# Patient Record
Sex: Male | Born: 1955 | Race: White | Hispanic: No | Smoking: Former smoker
Health system: Southern US, Community
[De-identification: ages and names within clinical notes are randomized; demographics above are authoritative.]

## PROBLEM LIST (undated history)

## (undated) DIAGNOSIS — E7800 Pure hypercholesterolemia, unspecified: Secondary | ICD-10-CM

## (undated) DIAGNOSIS — I509 Heart failure, unspecified: Secondary | ICD-10-CM

## (undated) DIAGNOSIS — S838X9A Sprain of other specified parts of unspecified knee, initial encounter: Secondary | ICD-10-CM

## (undated) DIAGNOSIS — C4359 Malignant melanoma of other part of trunk: Secondary | ICD-10-CM

## (undated) DIAGNOSIS — J449 Chronic obstructive pulmonary disease, unspecified: Secondary | ICD-10-CM

## (undated) DIAGNOSIS — Z95810 Presence of automatic (implantable) cardiac defibrillator: Secondary | ICD-10-CM

## (undated) DIAGNOSIS — M1990 Unspecified osteoarthritis, unspecified site: Secondary | ICD-10-CM

## (undated) DIAGNOSIS — Z9989 Dependence on other enabling machines and devices: Secondary | ICD-10-CM

## (undated) DIAGNOSIS — I10 Essential (primary) hypertension: Secondary | ICD-10-CM

## (undated) DIAGNOSIS — S83206A Unspecified tear of unspecified meniscus, current injury, right knee, initial encounter: Secondary | ICD-10-CM

## (undated) DIAGNOSIS — G4733 Obstructive sleep apnea (adult) (pediatric): Secondary | ICD-10-CM

## (undated) DIAGNOSIS — N400 Enlarged prostate without lower urinary tract symptoms: Secondary | ICD-10-CM

## (undated) DIAGNOSIS — I2510 Atherosclerotic heart disease of native coronary artery without angina pectoris: Secondary | ICD-10-CM

## (undated) DIAGNOSIS — I219 Acute myocardial infarction, unspecified: Secondary | ICD-10-CM

## (undated) HISTORY — DX: Essential (primary) hypertension: I10

## (undated) HISTORY — DX: Benign prostatic hyperplasia without lower urinary tract symptoms: N40.0

## (undated) HISTORY — DX: Chronic obstructive pulmonary disease, unspecified: J44.9

## (undated) HISTORY — DX: Unspecified osteoarthritis, unspecified site: M19.90

## (undated) HISTORY — PX: HERNIA REPAIR: SHX51

## (undated) HISTORY — DX: Acute myocardial infarction, unspecified: I21.9

## (undated) HISTORY — DX: Sprain of other specified parts of unspecified knee, initial encounter: S83.8X9A

## (undated) HISTORY — PX: CARDIAC CATHETERIZATION: SHX172

---
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
## 1996-05-02 DIAGNOSIS — I219 Acute myocardial infarction, unspecified: Secondary | ICD-10-CM

## 1996-05-02 HISTORY — DX: Acute myocardial infarction, unspecified: I21.9

## 2011-05-03 DIAGNOSIS — C4359 Malignant melanoma of other part of trunk: Secondary | ICD-10-CM

## 2011-05-03 HISTORY — PX: MELANOMA EXCISION: SHX5266

## 2011-05-03 HISTORY — DX: Malignant melanoma of other part of trunk: C43.59

## 2011-05-03 HISTORY — PX: CYST REMOVAL LEG: SHX6280

## 2012-05-02 HISTORY — PX: UMBILICAL HERNIA REPAIR: SHX196

## 2012-08-14 DIAGNOSIS — K810 Acute cholecystitis: Secondary | ICD-10-CM | POA: Insufficient documentation

## 2012-11-22 DIAGNOSIS — L729 Follicular cyst of the skin and subcutaneous tissue, unspecified: Secondary | ICD-10-CM | POA: Insufficient documentation

## 2012-12-26 DIAGNOSIS — L089 Local infection of the skin and subcutaneous tissue, unspecified: Secondary | ICD-10-CM | POA: Insufficient documentation

## 2013-05-02 HISTORY — PX: LAPAROSCOPIC CHOLECYSTECTOMY: SUR755

## 2013-11-22 DIAGNOSIS — J449 Chronic obstructive pulmonary disease, unspecified: Secondary | ICD-10-CM | POA: Insufficient documentation

## 2013-11-22 DIAGNOSIS — N529 Male erectile dysfunction, unspecified: Secondary | ICD-10-CM | POA: Insufficient documentation

## 2013-12-14 DIAGNOSIS — I255 Ischemic cardiomyopathy: Secondary | ICD-10-CM | POA: Insufficient documentation

## 2014-02-14 DIAGNOSIS — E8881 Metabolic syndrome: Secondary | ICD-10-CM | POA: Insufficient documentation

## 2014-04-07 DIAGNOSIS — Z713 Dietary counseling and surveillance: Secondary | ICD-10-CM | POA: Insufficient documentation

## 2014-11-05 HISTORY — PX: LAPAROSCOPIC GASTRIC BAND REMOVAL WITH LAPAROSCOPIC GASTRIC SLEEVE RESECTION: SHX6498

## 2016-10-12 ENCOUNTER — Ambulatory Visit (INDEPENDENT_AMBULATORY_CARE_PROVIDER_SITE_OTHER): Payer: Medicare HMO | Admitting: Family Medicine

## 2016-10-12 ENCOUNTER — Encounter: Payer: Self-pay | Admitting: Family Medicine

## 2016-10-12 DIAGNOSIS — I509 Heart failure, unspecified: Secondary | ICD-10-CM

## 2016-10-12 DIAGNOSIS — Z85820 Personal history of malignant melanoma of skin: Secondary | ICD-10-CM

## 2016-10-12 DIAGNOSIS — I10 Essential (primary) hypertension: Secondary | ICD-10-CM

## 2016-10-12 DIAGNOSIS — G4733 Obstructive sleep apnea (adult) (pediatric): Secondary | ICD-10-CM | POA: Insufficient documentation

## 2016-10-12 DIAGNOSIS — N400 Enlarged prostate without lower urinary tract symptoms: Secondary | ICD-10-CM | POA: Diagnosis not present

## 2016-10-12 MED ORDER — TAMSULOSIN HCL 0.4 MG PO CAPS: 0.4000 mg | ORAL_CAPSULE | Freq: Every day | ORAL | 3 refills | Status: DC

## 2016-10-12 NOTE — Progress Notes (Signed)
BP 115/64   Pulse 63   Temp 97.4 F (36.3 C) (Oral)   Ht 5' 10" (1.778 m)   Wt 270 lb (122.5 kg)   BMI 38.74 kg/m    Subjective:    Patient ID: Russell Werner, male    DOB: 12-Dec-1955, 61 y.o.   MRN: [id number]  HPI: Russell Werner is a 61 y.o. male presenting on 10/12/2016 for Establish Care (recently moved here from [place])   HPI Hypertension And congestive heart failure Patient comes to establish care. Patient is currently on lisinopril and bisoprolol, and her blood pressure today is 115/64. Patient denies any lightheadedness or dizziness. Patient denies headaches, blurred vision, chest pains, shortness of breath, or weakness. Denies any side effects from medication and is content with current medication. He denies any swelling  Sleep apnea symptoms Patient has been having snoring and his wife tells him that he stops breathing while he is sleeping and needs a referral for sleep study.  BPH checkup Patient is on Flomax currently for BPH and says it is working well for him. He just needs to continue that medication. He urinates about 1-2 times a night and denies any major stream issues currently.  History of melanoma Patient has a history of melanoma and just needs a new dermatologist to get regular checkups here now that he lives in this area. He has not noticed any skin lesions that have arisen and his lesion that he had previously was on his back.  Relevant past medical, surgical, family and social history reviewed and updated as indicated. Interim medical history since our last visit reviewed. Allergies and medications reviewed and updated.  Review of Systems  Constitutional: Negative for chills and fever.  Eyes: Negative for discharge.  Respiratory: Negative for shortness of breath and wheezing.   Cardiovascular: Negative for chest pain, palpitations and leg swelling.  Musculoskeletal: Negative for back pain and gait problem.  Skin: Negative for rash.  Neurological:  Negative for dizziness, weakness, light-headedness and numbness.  Psychiatric/Behavioral: Positive for sleep disturbance. Negative for self-injury and suicidal ideas. The patient is not nervous/anxious.   All other systems reviewed and are negative.   Per HPI unless specifically indicated above  Social History   Social History  . Marital status: Single    Spouse name: N/A  . Number of children: N/A  . Years of education: N/A   Occupational History  . Not on file.   Social History Main Topics  . Smoking status: Former Smoker    Packs/day: 1.00    Types: Cigarettes    Quit date: 05/02/2014  . Smokeless tobacco: Never Used  . Alcohol use Yes     Comment: occasional  . Drug use: No  . Sexual activity: Not on file   Other Topics Concern  . Not on file   Social History Narrative  . No narrative on file    Past Surgical History:  Procedure Laterality Date  . CARDIAC CATHETERIZATION    . CHOLECYSTECTOMY  2015  . CYST REMOVAL LEG  2013  . HERNIA REPAIR  2014  . KNEE ARTHROSCOPY Left 1972  . LAPAROSCOPIC GASTRIC BAND REMOVAL WITH LAPAROSCOPIC GASTRIC SLEEVE RESECTION  11/05/2014  . MELANOMA EXCISION  2013    Family History  Problem Relation Age of Onset  . Early death Mother   . Cancer Sister        breast  . HIV Brother   . Cancer Maternal Grandmother        breast  .  Stroke Maternal Grandfather   . Cancer Sister        melanoma    Allergies as of 10/12/2016   No Known Allergies     Medication List       Accurate as of 10/12/16  9:37 AM. Always use your most recent med list.          bisoprolol 5 MG tablet Commonly known as:  ZEBETA Take 2.5 mg by mouth daily. Take 1/2 of 5 mg tablet daily   clindamycin 1 % gel Commonly known as:  CLINDAGEL Apply 1 application topically daily.   ketoconazole 2 % cream Commonly known as:  NIZORAL Apply 1 application topically 2 (two) times daily.   lisinopril 2.5 MG tablet Commonly known as:   PRINIVIL,ZESTRIL Take 2.5 mg by mouth daily.   tamsulosin 0.4 MG Caps capsule Commonly known as:  FLOMAX Take 1 capsule (0.4 mg total) by mouth daily after supper. Take 1 capsule daily 1/2 hour after the same meal daily          Objective:    BP 115/64   Pulse 63   Temp 97.4 F (36.3 C) (Oral)   Ht 5' 10" (1.778 m)   Wt 270 lb (122.5 kg)   BMI 38.74 kg/m   Wt Readings from Last 3 Encounters:  10/12/16 270 lb (122.5 kg)    Physical Exam  Constitutional: He is oriented to person, place, and time. He appears well-developed and well-nourished. No distress.  Eyes: Conjunctivae are normal. No scleral icterus.  Neck: Neck supple. No thyromegaly present.  Cardiovascular: Normal rate, regular rhythm, normal heart sounds and intact distal pulses.   No murmur heard. Pulmonary/Chest: Effort normal and breath sounds normal. No respiratory distress. He has no wheezes. He has no rales.  Musculoskeletal: Normal range of motion. He exhibits no edema.  Lymphadenopathy:    He has no cervical adenopathy.  Neurological: He is alert and oriented to person, place, and time. Coordination normal.  Skin: Skin is warm and dry. No rash noted. He is not diaphoretic.  Psychiatric: He has a normal mood and affect. His behavior is normal.  Nursing note and vitals reviewed.   No results found for this or any previous visit.    Assessment & Plan:   Problem List Items Addressed This Visit      Cardiovascular and Mediastinum   CHF (congestive heart failure) (HCC)   Relevant Medications   lisinopril (PRINIVIL,ZESTRIL) 2.5 MG tablet   bisoprolol (ZEBETA) 5 MG tablet   Other Relevant Orders   ECHOCARDIOGRAM COMPLETE   CBC with Differential/Platelet   Lipid panel   Hypertension   Relevant Medications   lisinopril (PRINIVIL,ZESTRIL) 2.5 MG tablet   bisoprolol (ZEBETA) 5 MG tablet   Other Relevant Orders   CMP14+EGFR   Lipid panel     Respiratory   Sleep apnea, obstructive   Relevant Orders    Ambulatory referral to Sleep Studies     Genitourinary   BPH (benign prostatic hyperplasia)   Relevant Medications   tamsulosin (FLOMAX) 0.4 MG CAPS capsule     Other   History of melanoma   Relevant Orders   Ambulatory referral to Dermatology       Follow up plan: Return in about 6 months (around 04/13/2017), or if symptoms worsen or fail to improve, for htn and bph follow up.  Caryl Pina, MD [facility] 10/12/2016, 9:37 AM

## 2016-10-14 ENCOUNTER — Other Ambulatory Visit: Payer: Self-pay

## 2016-10-14 ENCOUNTER — Other Ambulatory Visit (HOSPITAL_COMMUNITY): Payer: Self-pay

## 2016-10-14 ENCOUNTER — Ambulatory Visit (HOSPITAL_COMMUNITY)
Admission: RE | Admit: 2016-10-14 | Discharge: 2016-10-14 | Disposition: A | Discharge status: Home / Self Care | Payer: Medicare HMO | Source: Ambulatory Visit | Attending: Family Medicine | Admitting: Family Medicine

## 2016-10-14 DIAGNOSIS — G4733 Obstructive sleep apnea (adult) (pediatric): Secondary | ICD-10-CM | POA: Diagnosis not present

## 2016-10-14 DIAGNOSIS — I509 Heart failure, unspecified: Secondary | ICD-10-CM | POA: Diagnosis not present

## 2016-10-14 DIAGNOSIS — I110 Hypertensive heart disease with heart failure: Secondary | ICD-10-CM | POA: Diagnosis not present

## 2016-10-14 LAB — ECHOCARDIOGRAM COMPLETE
AVLVOTPG: 2 mmHg
CHL CUP MV DEC (S): 232
E decel time: 232 msec
E/e' ratio: 17.72
FS: 15 % — AB (ref 28–44)
IVS/LV PW RATIO, ED: 0.98
LA diam end sys: 55 mm
LA diam index: 2.19 cm/m2
LA vol: 107 mL
LASIZE: 55 mm
LAVOLA4C: 118 mL
LAVOLIN: 42.6 mL/m2
LV E/e' medial: 17.72
LV TDI E'MEDIAL: 6.09
LV dias vol: 210 mL — AB (ref 62–150)
LV e' LATERAL: 5.22 cm/s
LVDIAVOLIN: 84 mL/m2
LVEEAVG: 17.72
LVOT SV: 64 mL
LVOT VTI: 16.9 cm
LVOT area: 3.8 cm2
LVOT peak vel: 73.1 cm/s
LVOTD: 22 mm
LVSYSVOL: 138 mL — AB
LVSYSVOLIN: 55 mL/m2
Lateral S' vel: 12.1 cm/s
MV Peak grad: 3 mmHg
MVPKAVEL: 96.3 m/s
MVPKEVEL: 92.5 m/s
PW: 12.7 mm — AB (ref 0.6–1.1)
Simpson's disk: 34
Stroke v: 72 ml
TAPSE: 19.1 mm
TDI e' lateral: 5.22

## 2016-10-14 NOTE — Progress Notes (Signed)
*  PRELIMINARY RESULTS* Echocardiogram 2D Echocardiogram has been performed.  Russell Werner 10/14/2016, 12:10 PM

## 2016-11-01 DIAGNOSIS — D485 Neoplasm of uncertain behavior of skin: Secondary | ICD-10-CM | POA: Diagnosis not present

## 2016-11-01 DIAGNOSIS — D2239 Melanocytic nevi of other parts of face: Secondary | ICD-10-CM | POA: Diagnosis not present

## 2016-11-01 DIAGNOSIS — C44319 Basal cell carcinoma of skin of other parts of face: Secondary | ICD-10-CM | POA: Diagnosis not present

## 2016-11-01 DIAGNOSIS — D225 Melanocytic nevi of trunk: Secondary | ICD-10-CM | POA: Diagnosis not present

## 2016-11-01 DIAGNOSIS — Z85820 Personal history of malignant melanoma of skin: Secondary | ICD-10-CM | POA: Diagnosis not present

## 2016-11-10 ENCOUNTER — Ambulatory Visit (INDEPENDENT_AMBULATORY_CARE_PROVIDER_SITE_OTHER): Payer: Medicare HMO | Admitting: Cardiovascular Disease

## 2016-11-10 ENCOUNTER — Encounter: Payer: Self-pay | Admitting: Cardiovascular Disease

## 2016-11-10 VITALS — BP 131/86 | HR 64 | Ht 70.0 in | Wt 278.2 lb

## 2016-11-10 DIAGNOSIS — I10 Essential (primary) hypertension: Secondary | ICD-10-CM | POA: Diagnosis not present

## 2016-11-10 DIAGNOSIS — I5022 Chronic systolic (congestive) heart failure: Secondary | ICD-10-CM | POA: Diagnosis not present

## 2016-11-10 DIAGNOSIS — I429 Cardiomyopathy, unspecified: Secondary | ICD-10-CM | POA: Diagnosis not present

## 2016-11-10 DIAGNOSIS — G4733 Obstructive sleep apnea (adult) (pediatric): Secondary | ICD-10-CM

## 2016-11-10 MED ORDER — SACUBITRIL-VALSARTAN 24-26 MG PO TABS: 1.0000 | ORAL_TABLET | Freq: Two times a day (BID) | ORAL | 6 refills | Status: DC

## 2016-11-10 MED ORDER — SACUBITRIL-VALSARTAN 24-26 MG PO TABS: 1.0000 | ORAL_TABLET | Freq: Two times a day (BID) | ORAL | 0 refills | Status: DC

## 2016-11-10 NOTE — Addendum Note (Signed)
Addended by: Laurine Blazer on: 11/10/2016 09:46 AM   Modules accepted: Orders

## 2016-11-10 NOTE — Patient Instructions (Signed)
Medication Instructions:   Stop Lisinopril.  Begin Entresto 24/26mg  twice a day - may begin tomorrow evening for first dose.  Continue all other medications.    Labwork:  TSH, CMET, CBC - orders given today.  Office will contact with results via phone or letter.    Testing/Procedures: none  Follow-Up: 3 months   Any Other Special Instructions Will Be Listed Below (If Applicable).  If you need a refill on your cardiac medications before your next appointment, please call your pharmacy.

## 2016-11-10 NOTE — Progress Notes (Signed)
CARDIOLOGY CONSULT NOTE  Patient ID: Russell Werner MRN: [id number] DOB/AGE: [birth year] 61 y.o.  Admit date: (Not on file) Primary Physician: Dettinger, Fransisca Kaufmann, MD Referring Physician: Dettinger  Reason for Consultation: Cardiomyopathy  HPI: Russell Werner is a 61 y.o. male who is being seen today for the evaluation of cardiomyopathy at the request of Dettinger, Fransisca Kaufmann, MD.   Echocardiogram 10/14/16 demonstrated severely reduced left ventricular systolic function with diffuse hypokinesis, LVEF 15-20%, mild LVH, grade 1 diastolic dysfunction, elevated ventricular filling pressures, akinesis of the basal anteroseptal and inferoseptal walls, mild aortic root dilatation, moderate to severe left atrial dilatation.  He has a history of hypertension and severe sleep apnea.  There is a prior history of tobacco abuse.  He said he had a "mild heart attack" about 20 years ago in [place]. He underwent cardiac catheterization at that time with no PCI. He then underwent coronary angiography 7 or 8 years ago at [facility] in [place]. His cardiologist for the past 3 years has been Dr. Mee Hives who works with [facility] in [place], [place].  He has been scheduled for a sleep study on 11/24/2016. He has not used CPAP for the past 5 months.  He used to weigh 440 pounds but underwent bariatric surgery on 11/05/2014 and got down to 220 pounds.  He relocated to [place] from [place] about 5 months ago. He had been caring for his friend's mother who passed away recently. His sleep habits became disrupted and he put on 50 pounds. He had been exercising at the gym 6 days per week before moving here.  He denies chest pain and palpitations. He gets short of breath occasionally when he vapes.  He said an AICD was never discussed with him by his previous cardiologist. He has been told in the past that his cardiomyopathy/congestive heart failure is  due to severe sleep apnea.  He has a history of COPD as well.  ECG performed in the office today which I ordered and personally interpreted demonstrates sinus rhythm with LVH, repolarization abnormalities, and nonspecific intraventricular conduction delay.      No Known Allergies  Current Outpatient Prescriptions  Medication Sig Dispense Refill  . bisoprolol (ZEBETA) 5 MG tablet Take 2.5 mg by mouth daily. Take 1/2 of 5 mg tablet daily    . clindamycin (CLINDAGEL) 1 % gel Apply 1 application topically daily.    Marland Kitchen ketoconazole (NIZORAL) 2 % cream Apply 1 application topically 2 (two) times daily.    Marland Kitchen lisinopril (PRINIVIL,ZESTRIL) 2.5 MG tablet Take 2.5 mg by mouth daily.    . tamsulosin (FLOMAX) 0.4 MG CAPS capsule Take 1 capsule (0.4 mg total) by mouth daily after supper. Take 1 capsule daily 1/2 hour after the same meal daily 30 capsule 3   No current facility-administered medications for this visit.     Past Medical History:  Diagnosis Date  . Arthritis   . Cancer ([place])    skin  . COPD (chronic obstructive pulmonary disease) ([facility])   . Enlarged prostate   . Hypertension   . Myocardial infarction ([place]) 1998   mild  . Sleep apnea     Past Surgical History:  Procedure Laterality Date  . CARDIAC CATHETERIZATION    . CHOLECYSTECTOMY  2015  . CYST REMOVAL LEG  2013  . HERNIA REPAIR  2014  . KNEE ARTHROSCOPY Left 1972  . LAPAROSCOPIC GASTRIC BAND REMOVAL WITH LAPAROSCOPIC GASTRIC SLEEVE RESECTION  11/05/2014  . MELANOMA EXCISION  2013    Social History   Social History  . Marital status: Single    Spouse name: N/A  . Number of children: N/A  . Years of education: N/A   Occupational History  . Not on file.   Social History Main Topics  . Smoking status: Former Smoker    Packs/day: 1.00    Years: 47.00    Types: Cigarettes, E-cigarettes    Start date: 06/24/1967    Quit date: 05/02/2014  . Smokeless tobacco: Never Used  . Alcohol use Yes     Comment:  occasional  . Drug use: No  . Sexual activity: Not on file   Other Topics Concern  . Not on file   Social History Narrative  . No narrative on file     No family history of premature CAD in 1st degree relatives.  No outpatient prescriptions have been marked as taking for the 11/10/16 encounter (Office Visit) with Herminio Commons, MD.      Review of systems complete and found to be negative unless listed above in HPI    Physical exam Blood pressure 131/86, pulse 64, height 5\' 10"  (1.778 m), weight 278 lb 3.2 oz (126.2 kg), SpO2 94 %. General: NAD Neck: No JVD, no thyromegaly or thyroid nodule.  Lungs: Clear to auscultation bilaterally with normal respiratory effort. CV: Nondisplaced PMI. Regular rate and rhythm, normal S1/S2, no S3/S4, no murmur.  No peripheral edema.  No carotid bruit.    Abdomen: Soft, nontender, no distention.  Skin: Intact without lesions or rashes.  Neurologic: Alert and oriented x 3.  Psych: Normal affect. Extremities: No clubbing or cyanosis.  HEENT: Normal.   ECG: Most recent ECG reviewed.   Labs: No results found for: K, BUN, CREATININE, ALT, TSH, HGB   Lipids: No results found for: LDLCALC, LDLDIRECT, CHOL, TRIG, HDL      ASSESSMENT AND PLAN:  1. Cardiomyopathy/chronic systolic heart failure: He is on bisoprolol. I will discontinue lisinopril and start Entresto 24/26 mg bid. I will attempt to obtain cardiac records from his cardiologist in [place] including coronary angiography reports, echocardiogram reports, and most recent office notes for personal review. I informed him of possible side effects of Entresto including lightheadedness and dizziness. I will obtain a TSH, comprehensive metabolic panel, and CBC. After he has been on optimal medical therapy for several months I will repeat an echocardiogram. If left ventricular systolic function remains severely reduced, I will refer to EP for ICD candidacy. QRS duration does not meet  criteria for cardiac resynchronization therapy. He has not needed diuretics since undergoing bariatric surgery.  2. Hypertension: Controlled. Monitor given institution of Entresto.  3. Severe sleep apnea: Await sleep study results.  Disposition: Follow up in 3 months  Signed: Kate Sable, M.D., F.A.C.C.  11/10/2016, 8:35 AM

## 2016-11-22 ENCOUNTER — Other Ambulatory Visit: Payer: Medicare HMO

## 2016-11-22 ENCOUNTER — Other Ambulatory Visit: Payer: Self-pay | Admitting: Cardiovascular Disease

## 2016-11-22 DIAGNOSIS — I10 Essential (primary) hypertension: Secondary | ICD-10-CM | POA: Diagnosis not present

## 2016-11-22 DIAGNOSIS — I429 Cardiomyopathy, unspecified: Secondary | ICD-10-CM | POA: Diagnosis not present

## 2016-11-22 MED ORDER — SACUBITRIL-VALSARTAN 24-26 MG PO TABS: 1.0000 | ORAL_TABLET | Freq: Two times a day (BID) | ORAL | 1 refills | Status: DC

## 2016-11-23 ENCOUNTER — Other Ambulatory Visit: Payer: Self-pay

## 2016-11-23 LAB — COMPREHENSIVE METABOLIC PANEL
ALBUMIN: 3.8 g/dL (ref 3.6–4.8)
ALK PHOS: 55 IU/L (ref 39–117)
ALT: 11 IU/L (ref 0–44)
AST: 11 IU/L (ref 0–40)
Albumin/Globulin Ratio: 1.7 (ref 1.2–2.2)
BUN/Creatinine Ratio: 23 (ref 10–24)
BUN: 18 mg/dL (ref 8–27)
Bilirubin Total: 0.6 mg/dL (ref 0.0–1.2)
CALCIUM: 8.9 mg/dL (ref 8.6–10.2)
CO2: 25 mmol/L (ref 20–29)
Chloride: 103 mmol/L (ref 96–106)
Creatinine, Ser: 0.77 mg/dL (ref 0.76–1.27)
GFR calc Af Amer: 112 mL/min/{1.73_m2} (ref >59)
GFR calc non Af Amer: 97 mL/min/{1.73_m2} (ref >59)
GLUCOSE: 103 mg/dL — AB (ref 65–99)
Globulin, Total: 2.3 g/dL (ref 1.5–4.5)
POTASSIUM: 4.4 mmol/L (ref 3.5–5.2)
SODIUM: 144 mmol/L (ref 134–144)
TOTAL PROTEIN: 6.1 g/dL (ref 6.0–8.5)

## 2016-11-23 LAB — TSH: TSH: 2.33 u[IU]/mL (ref 0.450–4.500)

## 2016-11-23 LAB — CBC
HEMOGLOBIN: 13.9 g/dL (ref 13.0–17.7)
Hematocrit: 41.2 % (ref 37.5–51.0)
MCH: 30.1 pg (ref 26.6–33.0)
MCHC: 33.7 g/dL (ref 31.5–35.7)
MCV: 89 fL (ref 79–97)
Platelets: 176 10*3/uL (ref 150–379)
RBC: 4.62 x10E6/uL (ref 4.14–5.80)
RDW: 13.4 % (ref 12.3–15.4)
WBC: 5.9 10*3/uL (ref 3.4–10.8)

## 2016-11-23 LAB — PLEASE NOTE

## 2016-11-23 MED ORDER — SACUBITRIL-VALSARTAN 24-26 MG PO TABS: 1.0000 | ORAL_TABLET | Freq: Two times a day (BID) | ORAL | 1 refills | Status: DC

## 2016-11-24 ENCOUNTER — Ambulatory Visit (INDEPENDENT_AMBULATORY_CARE_PROVIDER_SITE_OTHER): Payer: Medicare HMO | Admitting: Neurology

## 2016-11-24 ENCOUNTER — Encounter: Payer: Self-pay | Admitting: Neurology

## 2016-11-24 VITALS — BP 116/81 | HR 67 | Ht 70.0 in | Wt 274.5 lb

## 2016-11-24 DIAGNOSIS — R0989 Other specified symptoms and signs involving the circulatory and respiratory systems: Secondary | ICD-10-CM

## 2016-11-24 DIAGNOSIS — J449 Chronic obstructive pulmonary disease, unspecified: Secondary | ICD-10-CM

## 2016-11-24 DIAGNOSIS — I43 Cardiomyopathy in diseases classified elsewhere: Secondary | ICD-10-CM | POA: Diagnosis not present

## 2016-11-24 DIAGNOSIS — G4733 Obstructive sleep apnea (adult) (pediatric): Secondary | ICD-10-CM | POA: Diagnosis not present

## 2016-11-24 DIAGNOSIS — I110 Hypertensive heart disease with heart failure: Secondary | ICD-10-CM

## 2016-11-24 NOTE — Patient Instructions (Signed)

## 2016-11-24 NOTE — Progress Notes (Signed)
SLEEP MEDICINE CLINIC   Provider:  Larey Seat, M D  Primary Care Physician:  Dettinger, Fransisca Kaufmann, MD   Referring Provider: Dettinger, Fransisca Kaufmann, MD    Chief Complaint  Patient presents with  . New Patient (Initial Visit)    HPI:  Russell Werner is a 61 y.o. male , seen here as in a referral/ revisit  from Dr. Warrick Parisian for a sleep coinsultation. [name] is a 61 year old Caucasian gentleman who presents with a copy of his previous sleep studies. The patient used to be super obese, and his peak his weight was 440 pounds. He was diagnosed with obesity hypoventilation, congestive heart failure, cardiomyopathy, hypertension and COPD. His last polysomnography was performed as a split night in [place], [place]. His total AHI was 79.8, the oxygen nadir was 69% during REM and 71% in non-REM sleep, and he spent 40.5 minutes in desaturation. His RDI was 101.7 he qualified for split-night protocol and was titrated to BiPAP as he had previously failed CPAP. Beginning at 12 cm water over 8 he was titrated to a final pressure of 18/14. Achieved an AHI of 0.0, but without any sleep at that setting! The interpreting physician stated that also there was severe obstructive sleep apnea and hypoxemia present no optimal pressure was reached. Also that there was a high air leak through a face mask attributed to facial hair. He was given BiPAP at 25/21 and he couldn't tolerate it.  He stopped using it, instead pursued weight loss, and quit smoking. He lost about 100 pounds by using vitamin B supplements, followed by a bariatric surgery 2 years ago. He underwent sleeve surgery, and since then has lost another another additional 130 pounds. He regained 60 pounds over the last 5 months and his wife now has noted him again to snore and has some apneic breathing.   Sleep habits are as follows: Because to bed between 10 and 11:30 pm, he is usually asleep rather promptly, he sleeps on his right side. He  has nocturia 1 on average.  Sleeps on one pillow, reports no vivid dreams. Usually he leaves for the bathroom break at around 3 AM but can go back to sleep. He rises at 5 AM. He averages 6 hours of sleep at night, but only if he doesn't sleep in daytime.A daytime nap the last about 45 minutes, usually after lunch.   Sleep medical history and family sleep history: The patient has no known family history of sleep disordered breathing, no history of sleepwalking, but he had sleep apnea since childhood.  Social history:  Lives with girlfriend. Moved to [place] 4 month ago. The patient has not worked since 2010, he was granted disability 3 years ago. He used to be a Medical illustrator. Quit smoking 2 years ago - but uses vapor. 40 pack year history. ETOH seldomly- 3-4 a year, caffeine use - 40 ounces coffee a day .  Review of Systems: Out of a complete 14 system review, the patient complains of only the following symptoms, and all other reviewed systems are negative.  Blurred vision, snoring, tinnitus, the feeling of not getting enough sleep, insomnia and snoring, urinary problems, history of COPD, CHF, hypertension, history of superobesity, cholecystectomy, bariatric surgery , melanoma on his back- surgically removed- no mets. . I reviewed the patient's medications,  Epworth score 7 with daytime naps  , Fatigue severity score 25  , depression score 1/15    Social History   Social History  . Marital  status: Single    Spouse name: N/A  . Number of children: N/A  . Years of education: N/A   Occupational History  . Not on file.   Social History Main Topics  . Smoking status: Former Smoker    Packs/day: 1.00    Years: 47.00    Types: Cigarettes, E-cigarettes    Start date: 06/24/1967    Quit date: 05/02/2014  . Smokeless tobacco: Never Used  . Alcohol use Yes     Comment: occasional  . Drug use: No  . Sexual activity: Not on file   Other Topics Concern  . Not on file   Social History Narrative  .  No narrative on file    Family History  Problem Relation Age of Onset  . Early death Mother   . Cancer Sister        breast  . HIV Brother   . Cancer Maternal Grandmother        breast  . Stroke Maternal Grandfather   . Cancer Sister        melanoma    Past Medical History:  Diagnosis Date  . Arthritis   . Cancer ([place])    skin  . COPD (chronic obstructive pulmonary disease) ([place])   . Enlarged prostate   . Hypertension   . Myocardial infarction ([facility]) 1998   mild  . Sleep apnea     Past Surgical History:  Procedure Laterality Date  . CARDIAC CATHETERIZATION    . CHOLECYSTECTOMY  2015  . CYST REMOVAL LEG  2013  . HERNIA REPAIR  2014  . KNEE ARTHROSCOPY Left 1972  . LAPAROSCOPIC GASTRIC BAND REMOVAL WITH LAPAROSCOPIC GASTRIC SLEEVE RESECTION  11/05/2014  . MELANOMA EXCISION  2013    Current Outpatient Prescriptions  Medication Sig Dispense Refill  . bisoprolol (ZEBETA) 5 MG tablet Take 2.5 mg by mouth daily. Take 1/2 of 5 mg tablet daily    . Calcium Carbonate (CALTRATE 600 PO) Take 1 tablet by mouth 2 (two) times daily.    . clindamycin (CLINDAGEL) 1 % gel Apply 1 application topically daily.    Marland Kitchen ketoconazole (NIZORAL) 2 % cream Apply 1 application topically 2 (two) times daily.    . sacubitril-valsartan (ENTRESTO) 24-26 MG Take 1 tablet by mouth 2 (two) times daily. 60 tablet 1  . tamsulosin (FLOMAX) 0.4 MG CAPS capsule Take 1 capsule (0.4 mg total) by mouth daily after supper. Take 1 capsule daily 1/2 hour after the same meal daily 30 capsule 3   No current facility-administered medications for this visit.     Allergies as of 11/24/2016  . (No Known Allergies)    Vitals: BP 116/81   Pulse 67   Ht 5\' 10"  (1.778 m)   Wt 274 lb 8 oz (124.5 kg)   BMI 39.39 kg/m  Last Weight:  Wt Readings from Last 1 Encounters:  11/24/16 274 lb 8 oz (124.5 kg)   VEL:FYBO mass index is 39.39 kg/m.     Last Height:   Ht Readings from Last 1 Encounters:  11/24/16 5\' 10"   (1.778 m)    Physical exam:  General: The patient is awake, alert and appears not in acute distress. The patient is well groomed. Head: Normocephalic, atraumatic. Neck is supple. Mallampati 3, the tip of the uvula is not visible. Functional microglossia, no retrognathia,  neck circumference:18". Nasal airflow patent,   Cardiovascular:  Regular rate and rhythm, and without distended neck veins. Respiratory: Lung auscultation; rhonchi. Skin:  Without evidence  of edema, or rash Trunk: BMI is  39.4 The patient's posture is poor  Neurologic exam : The patient is awake and alert, oriented to place and time.   Attention span & concentration ability appears normal.  Speech is fluent,  Without dysarthria, dysphonia or aphasia.  Mood and affect are appropriate.  Cranial nerves: Pupils are equal and briskly reactive to light. Funduscopic exam without evidence of pallor or edema.  Extraocular movements  in vertical and horizontal planes intact and without nystagmus. Visual fields by finger perimetry are intact. Hearing to finger rub intact.  Facial sensation intact to fine touch. Facial motor strength is symmetric and tongue and uvula move midline. Shoulder shrug was symmetrical.   Motor exam:  Normal tone, muscle bulk and symmetric strength in all extremities. Sensory:  Fine touch, pinprick and vibration were tested in all extremities. Proprioception tested in the upper extremities was normal. Coordination:  Finger-to-nose maneuver  normal without evidence of ataxia, dysmetria or tremor. Gait and station: Patient walks without assistive device and is able unassisted to climb up to the exam table. Strength within normal limits. Left knee buckles.  Stance is stable and normal.Tandem gait is unfragmented. Turns with 4  Steps. Romberg testing is  negative.  Deep tendon reflexes: in the  upper and lower extremities are symmetric and intact. Babinski maneuver response is downgoing.    Mr. Pinard  presents with a successful weight loss history, but remains morbidly obese. In comparison to where he started at 378 pounds he has certainly won a difficult battle. He is status post gastric sleeve surgery, but he still snores, he may still have apnea, and she craves off a daytime nap. His nocturnal sleep time is also limited usually between 5 and 6 hours only. Based on his last sleep study which was performed before bariatric surgery he had been using some BiPAP for years. His machine is long broken and he does not have axis to Pap therapy at home now. However he continues to have cardiomyopathy and may still have obesity hypoventilation as diagnosed by his pulmonologist. I would like for him to requalify in a split-night polysomnography with capnography. Please note that the BMI at the time of the last sleep study was 47.3 and is now 35.   Assessment:  After physical and neurologic examination, review of laboratory studies,  Personal review of imaging studies, reports of other /same  Imaging studies, results of polysomnography and / or neurophysiology testing and pre-existing records as far as provided in visit., my assessment is   1) History of obesity hypoventilation- followed by Pulmonologist, Cardiomyopathy, COPD and severe OSA. He couod not tolerate CPAP at the time when at his most obese status.   2) Likely OSA is still present, snoring is witnessed. He will need to present for a SPLIT night to requalify for positive airway pressure therapy. I like for him to try CPAP first, if CPAP fails him we will change to BiPAP, BiPAP ST ,or if needed, even ASV.  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 He noted that if he stays up all night he eats more - he burns less. He needs to go back to his normal circadian rhythm, setting a bedtime before midnight, rising after 7 hours of sleep if possible. He also needs to really establish an  exercise regimen which has suffered since he moved to [place].   The patient  was advised of the nature of the diagnosed disorder , the treatment options and the  risks for general health and wellness arising from not treating the condition.   I spent more than 45 minutes of face to face time with the patient.  Greater than 50% of time was spent in counseling and coordination of care. We have discussed the diagnosis and differential and I answered the patient's questions.    Plan:  Treatment plan and additional workup :   SPLIT with Co2- CPAP to try first, follow with BiPAP, ST or whatever modality needed. Avoid FFM with facial hair   RV after study.   Larey Seat, MD 09/16/9840, 1:03 AM  Certified in Neurology by ABPN Certified in [place] by [facility] [address], [place], [place] [address]

## 2016-11-29 ENCOUNTER — Telehealth: Payer: Self-pay | Admitting: *Deleted

## 2016-11-29 NOTE — Telephone Encounter (Signed)
Fax received from [facility] approved 08/26/2016 to 11/23/2017.  This application was submitted by the patient.

## 2016-12-08 DIAGNOSIS — C44319 Basal cell carcinoma of skin of other parts of face: Secondary | ICD-10-CM | POA: Diagnosis not present

## 2017-01-03 ENCOUNTER — Other Ambulatory Visit: Payer: Self-pay | Admitting: *Deleted

## 2017-01-03 DIAGNOSIS — N400 Enlarged prostate without lower urinary tract symptoms: Secondary | ICD-10-CM

## 2017-01-03 MED ORDER — TAMSULOSIN HCL 0.4 MG PO CAPS: 0.4000 mg | ORAL_CAPSULE | Freq: Every day | ORAL | 0 refills | Status: DC

## 2017-01-12 ENCOUNTER — Other Ambulatory Visit: Payer: Self-pay

## 2017-01-12 MED ORDER — SACUBITRIL-VALSARTAN 24-26 MG PO TABS: 1.0000 | ORAL_TABLET | Freq: Two times a day (BID) | ORAL | 3 refills | Status: DC

## 2017-01-17 ENCOUNTER — Other Ambulatory Visit: Payer: Self-pay | Admitting: Cardiovascular Disease

## 2017-01-22 ENCOUNTER — Ambulatory Visit (INDEPENDENT_AMBULATORY_CARE_PROVIDER_SITE_OTHER): Payer: Medicare HMO | Admitting: Neurology

## 2017-01-22 DIAGNOSIS — J449 Chronic obstructive pulmonary disease, unspecified: Secondary | ICD-10-CM

## 2017-01-22 DIAGNOSIS — R0989 Other specified symptoms and signs involving the circulatory and respiratory systems: Secondary | ICD-10-CM

## 2017-01-22 DIAGNOSIS — I110 Hypertensive heart disease with heart failure: Secondary | ICD-10-CM

## 2017-01-22 DIAGNOSIS — I43 Cardiomyopathy in diseases classified elsewhere: Principal | ICD-10-CM

## 2017-01-22 DIAGNOSIS — G4733 Obstructive sleep apnea (adult) (pediatric): Secondary | ICD-10-CM | POA: Diagnosis not present

## 2017-01-24 ENCOUNTER — Telehealth: Payer: Self-pay | Admitting: Neurology

## 2017-01-24 NOTE — Addendum Note (Signed)
Addended by: Larey Seat on: 01/24/2017 09:04 AM   Modules accepted: Orders

## 2017-01-24 NOTE — Telephone Encounter (Signed)
Pt returned call. I advised pt that Dr. Brett Fairy reviewed their sleep study results and found that pt Severe OSA. Dr. Brett Fairy recommends that pt starts auto CPAP. I reviewed PAP compliance expectations with the pt. Pt is agreeable to starting a CPAP. I advised pt that an order will be sent to a DME, [facility], and [facility] will call the pt within about one week after they file with the pt's insurance. [facility] will show the pt how to use the machine, fit for masks, and troubleshoot the CPAP if needed. A follow up appt was made for insurance purposes with Cecille Rubin, NP on Apr 11 2017 at 7:45 am. Pt verbalized understanding to arrive 15 minutes early and bring their CPAP. A letter with all of this information in it will be mailed to the pt as a reminder. I verified with the pt that the address we have on file is correct. Pt verbalized understanding of results. Pt had no questions at this time but was encouraged to call back if questions arise.

## 2017-01-24 NOTE — Telephone Encounter (Signed)
Called pt with sleep study results. No answer. LVM for patient to call back

## 2017-01-24 NOTE — Telephone Encounter (Signed)
-----   Message from Russell Seat, MD sent at 01/24/2017  9:04 AM EDT ----- Patient still has sleep apnea after weight loss surgery and had severe OSA with an AHI of 41/hr.  He responded well to CPAP at 9 cm water, nasal pillow.

## 2017-01-24 NOTE — Procedures (Signed)
PATIENT'S NAME:  Russell Werner, Russell Werner DOB:      Dec 24, 1955      MR#:    [id number]     DATE OF RECORDING: 01/22/2017 REFERRING M.D.:  Caryl Pina, MD Study Performed:  Split-Night Titration Study HISTORY:   [name] is a 61 year old Caucasian gentleman who presents with a copy of his previous sleep studies. The patient used to be super obese, and his peak weight was 440 pounds. He was diagnosed with obesity hypoventilation, congestive heart failure, cardiomyopathy, hypertension and COPD at his last polysomnography, which was performed in 2011 as a split night in [place], [place]. His total AHI was 79.8, the oxygen nadir was 69% during REM and 71% in non-REM sleep, and he spent 40.5 minutes in desaturation. His RDI was 101.7 he qualified for split-night protocol and was titrated to BiPAP as he had previously failed CPAP. Beginning at 12 / 8 cm he was titrated to a final pressure of 18/14 cm water, achieved an AHI of 0.0, but without any sleep at that setting! He was given BiPAP at 25/21 and he couldn't tolerate it- became non compliant. He stopped using it, instead pursued weight loss, and quit smoking. He lost about 100 pounds before surgery and additional 130 pds by gastric sleeve surgery in 2016. He regained 60 pounds over the last 5 months and his wife now has noted him again to snore and has witnessed some apneic breathing.  The patient endorsed the Epworth Sleepiness Scale at 7/24 points   The patient's weight 274 pounds with a height of 70 (inches), resulting in a BMI of 39.1 kg/m2. The patient's neck circumference measured 18 inches.  CURRENT MEDICATIONS: Bisoprolol, Calcium Carbonate, Clindamycin, Ketoconazole, Sacubitril and Tamsulosin  PROCEDURE:  This is a multichannel digital polysomnogram utilizing the SomnoStar 11.2 system.  Electrodes and sensors were applied and monitored per AASM Specifications.   EEG, EOG, Chin and Limb EMG, were sampled at 200 Hz.  ECG, Snore and Nasal  Pressure, Thermal Airflow, Respiratory Effort, CPAP Flow and Pressure, Oximetry was sampled at 50 Hz. Digital video and audio were recorded.      BASELINE STUDY WITHOUT CPAP RESULTS:  Lights Out was at 22:43 and Lights On at 05:00.  Total recording time (TRT) was 172, with a total sleep time (TST) of 124 minutes.   The patient's sleep latency was 29.5 minutes.  REM latency was 0 minutes.  The sleep efficiency was 72.1 %.    SLEEP ARCHITECTURE: WASO (Wake after sleep onset) was 30 minutes, Stage N1 was 22.5 minutes, Stage N2 was 101.5 minutes, Stage N3 was 0 minutes and Stage R (REM sleep) was 0 minutes.  The percentages were Stage N1 18.1%, Stage N2 81.9%, Stage N3 0% and Stage R (REM sleep) 0%.   RESPIRATORY ANALYSIS:  There were a total of 85 respiratory events:  26 obstructive apneas, 0 central apneas and 0 mixed apneas with a total of 26 apneas and an apnea index (AI) of 12.6. There were 59 hypopneas with a hypopnea index of 28.5. The patient also had 0 respiratory event related arousals (RERAs).  Snoring was noted.     The total APNEA/HYPOPNEA INDEX (AHI) was 41.1 /hour and the total RESPIRATORY DISTURBANCE INDEX was 41.1 /hour.  0 events occurred in REM sleep and 118 events in NREM. The REM AHI was 0, /hour versus a non-REM AHI of 41.1 /hour. The patient spent 0 minutes sleep time in the supine position 309 minutes in non-supine. The supine AHI  was 0.0 /hour versus a non-supine AHI of 41.1 /hour.  OXYGEN SATURATION & C02:  The wake baseline 02 saturation was 96%, with the lowest being 81%. Time spent below 89% saturation equaled 27 minutes. Co2 was not recorded.   PERIODIC LIMB MOVEMENTS:   The patient had a total of 0 Periodic Limb Movements. The arousals were noted as: 19 were spontaneous, 0 were associated with PLMs, and 86 were associated with respiratory events. Audio and video analysis did not show any abnormal or unusual movements, behaviors, phonations or vocalizations. EKG was in  keeping with normal sinus rhythm (NSR)    TITRATION STUDY WITH CPAP RESULTS:  CPAP was initiated at 5 cmH20 with heated humidity per AASM split night standards and pressure was advanced to 9/9 cmH20 because of hypopneas, apneas and desaturations.  At a PAP pressure of 9 cmH20, there was a reduction of the AHI to 0.0 /hour.   Total recording time (TRT) was 205 minutes, with a total sleep time (TST) of 185 minutes. The patient's sleep latency was 10 minutes. REM latency was 10 minutes.  The sleep efficiency was 90.2 %.    SLEEP ARCHITECTURE: Wake after sleep was 10 minutes, Stage N1 6.5 minutes, Stage N2 132.5 minutes, Stage N3 0 minutes and Stage R (REM sleep) 46 minutes. The percentages were: Stage N1 3.5%, Stage N2 71.6%, Stage N3 0% and Stage R (REM sleep) 24.9%.  The arousals were noted as: 12 were spontaneous, 0 were associated with PLMs, and 3 were associated with respiratory events.  RESPIRATORY ANALYSIS:  There were a total of 3 respiratory events: 0 apneas and 3 hypopneas with 0 respiratory event related arousals (RERAs). The total APNEA/HYPOPNEA INDEX (AHI) was 1.0 /hour and the total RESPIRATORY DISTURBANCE INDEX was 1.0 /hour.  1 events occurred in REM sleep and 2 events in NREM. The REM AHI was 1.3 /hour versus a non-REM AHI of .9 /hour. REM sleep was achieved on a pressure of 7 cm/H2O (AHI 1.9).The patient spent 0% of total sleep time in the supine position.  OXYGEN SATURATION & C02:  The wake baseline 02 saturation was 93%, with the lowest being 87%. Time spent below 89% saturation equaled 4 minutes.  PERIODIC LIMB MOVEMENTS:   The patient had a total of 0 Periodic Limb Movements.   POLYSOMNOGRAPHY IMPRESSION :   1. Severe Obstructive Sleep Apnea(OSA) with an AHI of 41.1/hr.    RECOMMENDATIONS:  1. Advise to start auto CPAP 5-10 cmH2O and follow clinical symptomatology. A ResMed Airfit P10 in medium size was used.   2. A follow up appointment will be scheduled in the Sleep  Clinic at [facility].      I certify that I have reviewed the entire raw data recording prior to the issuance of this report in accordance with the Standards of Accreditation of the American Academy of Sleep Medicine (AASM)      Larey Seat, M.D.     01-24-2017  Diplomat, American Board of Psychiatry and Neurology  Diplomat, [place] [facility] [employer] [facility] Medical Director, [place] [facility] at [facility]

## 2017-02-10 DIAGNOSIS — G4733 Obstructive sleep apnea (adult) (pediatric): Secondary | ICD-10-CM | POA: Diagnosis not present

## 2017-02-14 ENCOUNTER — Ambulatory Visit (INDEPENDENT_AMBULATORY_CARE_PROVIDER_SITE_OTHER): Payer: Medicare HMO | Admitting: Cardiovascular Disease

## 2017-02-14 ENCOUNTER — Telehealth: Payer: Self-pay | Admitting: Cardiovascular Disease

## 2017-02-14 ENCOUNTER — Encounter: Payer: Self-pay | Admitting: Cardiovascular Disease

## 2017-02-14 VITALS — BP 114/74 | HR 76 | Ht 70.0 in | Wt 285.0 lb

## 2017-02-14 DIAGNOSIS — I5022 Chronic systolic (congestive) heart failure: Secondary | ICD-10-CM | POA: Diagnosis not present

## 2017-02-14 DIAGNOSIS — I10 Essential (primary) hypertension: Secondary | ICD-10-CM | POA: Diagnosis not present

## 2017-02-14 DIAGNOSIS — G4733 Obstructive sleep apnea (adult) (pediatric): Secondary | ICD-10-CM

## 2017-02-14 DIAGNOSIS — I429 Cardiomyopathy, unspecified: Secondary | ICD-10-CM

## 2017-02-14 MED ORDER — BISOPROLOL FUMARATE 5 MG PO TABS: 2.5000 mg | ORAL_TABLET | Freq: Every day | ORAL | 3 refills | Status: DC

## 2017-02-14 NOTE — Patient Instructions (Signed)
Medication Instructions:  Continue all current medications.  Labwork: none  Testing/Procedures:  Your physician has requested that you have an echocardiogram. Echocardiography is a painless test that uses sound waves to create images of your heart. It provides your doctor with information about the size and shape of your heart and how well your heart's chambers and valves are working. This procedure takes approximately one hour. There are no restrictions for this procedure. - Due in December   Office will contact with results via phone or letter.    Follow-Up: Your physician wants you to follow up in: 6 months.  You will receive a reminder letter in the mail one-two months in advance.  If you don't receive a letter, please call our office to schedule the follow up appointment   Any Other Special Instructions Will Be Listed Below (If Applicable).  If you need a refill on your cardiac medications before your next appointment, please call your pharmacy.

## 2017-02-14 NOTE — Telephone Encounter (Signed)
Pre-cert Verification for the following procedure   Echo scheduled for 04-12-2017

## 2017-02-14 NOTE — Progress Notes (Signed)
SUBJECTIVE: The patient presents for follow-up of chronic systolic heart failure.  Echocardiogram 10/14/16 demonstrated severely reduced left ventricular systolic function with diffuse hypokinesis, LVEF 15-20%, mild LVH, grade 1 diastolic dysfunction, elevated ventricular filling pressures, akinesis of the basal anteroseptal and inferoseptal walls, mild aortic root dilatation, moderate to severe left atrial dilatation.  Sleep study on 01/22/17 demonstrated severe obstructive sleep apnea and he was advised to begin auto CPAP.  I checked labs in July 2018 and CBC, renal function, and TSH were normal.  The patient denies any symptoms of chest pain, palpitations, shortness of breath, lightheadedness, dizziness, leg swelling, orthopnea, PND, and syncope.  He is now using CPAP and sleeping better.   Review of Systems: As per "subjective", otherwise negative.  No Known Allergies  Current Outpatient Prescriptions  Medication Sig Dispense Refill  . bisoprolol (ZEBETA) 5 MG tablet Take 2.5 mg by mouth daily. Take 1/2 of 5 mg tablet daily    . Calcium Carbonate (CALTRATE 600 PO) Take 1 tablet by mouth 2 (two) times daily.    . clindamycin (CLINDAGEL) 1 % gel Apply 1 application topically daily.    Marland Kitchen ENTRESTO 24-26 MG TAKE 1 TABLET BY MOUTH TWICE A DAY 60 tablet 0  . ketoconazole (NIZORAL) 2 % cream Apply 1 application topically 2 (two) times daily.    . tamsulosin (FLOMAX) 0.4 MG CAPS capsule Take 1 capsule (0.4 mg total) by mouth daily after supper. Take 1 capsule daily 1/2 hour after the same meal daily 90 capsule 0   No current facility-administered medications for this visit.     Past Medical History:  Diagnosis Date  . Arthritis   . Cancer ([place])    skin  . COPD (chronic obstructive pulmonary disease) ([place])   . Enlarged prostate   . Hypertension   . Myocardial infarction ([place]) 1998   mild  . Sleep apnea     Past Surgical History:  Procedure Laterality Date  . CARDIAC  CATHETERIZATION    . CHOLECYSTECTOMY  2015  . CYST REMOVAL LEG  2013  . HERNIA REPAIR  2014  . KNEE ARTHROSCOPY Left 1972  . LAPAROSCOPIC GASTRIC BAND REMOVAL WITH LAPAROSCOPIC GASTRIC SLEEVE RESECTION  11/05/2014  . MELANOMA EXCISION  2013    Social History   Social History  . Marital status: Single    Spouse name: N/A  . Number of children: N/A  . Years of education: N/A   Occupational History  . Not on file.   Social History Main Topics  . Smoking status: Former Smoker    Packs/day: 1.00    Years: 47.00    Types: Cigarettes, E-cigarettes    Start date: 06/24/1967    Quit date: 05/02/2014  . Smokeless tobacco: Never Used  . Alcohol use Yes     Comment: occasional  . Drug use: No  . Sexual activity: Not on file   Other Topics Concern  . Not on file   Social History Narrative  . No narrative on file     Vitals:   02/14/17 1004  BP: 114/74  Pulse: 76  SpO2: 96%  Weight: 285 lb (129.3 kg)  Height: 5\' 10"  (1.778 m)    Wt Readings from Last 3 Encounters:  02/14/17 285 lb (129.3 kg)  11/24/16 274 lb 8 oz (124.5 kg)  11/10/16 278 lb 3.2 oz (126.2 kg)     PHYSICAL EXAM General: NAD HEENT: Normal. Neck: No JVD, no thyromegaly. Lungs: Clear to auscultation bilaterally with  normal respiratory effort. CV: Nondisplaced PMI.  Regular rate and rhythm, normal S1/S2, no S3/S4, no murmur. No pretibial or periankle edema.     Abdomen: Soft, nontender, no distention.  Neurologic: Alert and oriented.  Psych: Normal affect. Skin: Normal. Musculoskeletal: No gross deformities.    ECG: Most recent ECG reviewed.   Labs: Lab Results  Component Value Date/Time   K 4.4 11/22/2016 12:00 AM   BUN 18 11/22/2016 12:00 AM   CREATININE 0.77 11/22/2016 12:00 AM   ALT 11 11/22/2016 12:00 AM   TSH 2.330 11/22/2016 12:00 AM   HGB 13.9 11/22/2016 08:14 AM     Lipids: No results found for: LDLCALC, LDLDIRECT, CHOL, TRIG, HDL     ASSESSMENT AND PLAN:  1.  Cardiomyopathy/chronic systolic heart failure: He is on bisoprolol (which I will refill) and Entresto. He is symptomatically stable. I have not received records from [place] yet. After he has been on optimal medical therapy for several months I will repeat an echocardiogram (plans for December 2018). If left ventricular systolic function remains severely reduced, I will refer to EP for ICD candidacy. QRS duration does not meet criteria for cardiac resynchronization therapy. He has not needed diuretics since undergoing bariatric surgery.  2. Hypertension: Controlled on present therapy. No changes.  3. Severe sleep apnea: Now on CPAP.     Disposition: Follow up 6 months.   Kate Sable, M.D., F.A.C.C.

## 2017-02-21 ENCOUNTER — Other Ambulatory Visit: Payer: Self-pay | Admitting: Cardiovascular Disease

## 2017-03-13 DIAGNOSIS — G4733 Obstructive sleep apnea (adult) (pediatric): Secondary | ICD-10-CM | POA: Diagnosis not present

## 2017-04-10 ENCOUNTER — Telehealth: Payer: Self-pay | Admitting: *Deleted

## 2017-04-10 NOTE — Telephone Encounter (Signed)
LMVM for pt to call back to r/s appt due to weather.

## 2017-04-11 ENCOUNTER — Ambulatory Visit: Payer: Self-pay | Admitting: Nurse Practitioner

## 2017-04-12 ENCOUNTER — Other Ambulatory Visit: Payer: Self-pay

## 2017-04-12 ENCOUNTER — Other Ambulatory Visit: Payer: Medicare HMO

## 2017-04-12 ENCOUNTER — Ambulatory Visit (INDEPENDENT_AMBULATORY_CARE_PROVIDER_SITE_OTHER): Payer: Medicare HMO

## 2017-04-12 DIAGNOSIS — G4733 Obstructive sleep apnea (adult) (pediatric): Secondary | ICD-10-CM | POA: Diagnosis not present

## 2017-04-12 DIAGNOSIS — I5022 Chronic systolic (congestive) heart failure: Secondary | ICD-10-CM | POA: Diagnosis not present

## 2017-04-13 ENCOUNTER — Ambulatory Visit (INDEPENDENT_AMBULATORY_CARE_PROVIDER_SITE_OTHER): Payer: Medicare HMO | Admitting: Family Medicine

## 2017-04-13 ENCOUNTER — Encounter: Payer: Self-pay | Admitting: Family Medicine

## 2017-04-13 VITALS — BP 137/87 | HR 73 | Temp 97.8°F | Ht 70.0 in | Wt 295.0 lb

## 2017-04-13 DIAGNOSIS — Z1159 Encounter for screening for other viral diseases: Secondary | ICD-10-CM

## 2017-04-13 DIAGNOSIS — Z114 Encounter for screening for human immunodeficiency virus [HIV]: Secondary | ICD-10-CM

## 2017-04-13 DIAGNOSIS — I5042 Chronic combined systolic (congestive) and diastolic (congestive) heart failure: Secondary | ICD-10-CM

## 2017-04-13 DIAGNOSIS — R69 Illness, unspecified: Secondary | ICD-10-CM | POA: Diagnosis not present

## 2017-04-13 DIAGNOSIS — I10 Essential (primary) hypertension: Secondary | ICD-10-CM

## 2017-04-13 DIAGNOSIS — R7309 Other abnormal glucose: Secondary | ICD-10-CM | POA: Diagnosis not present

## 2017-04-13 NOTE — Progress Notes (Signed)
[facility]  PATIENT: Russell Werner DOB: 1955/06/29   REASON FOR VISIT: Follow-up for obstructive sleep apnea with initial CPAP compliance HISTORY FROM: Patient    HISTORY OF PRESENT ILLNESS:UPDATE 12/14/2018CM Russell Werner, 61 year old male returns for follow-up with obstructive sleep apnea initial CPAP compliance.  He claims he has been compliant feels much better during the day and now can attribute his decreased energy level to heart issues and will be getting a pacemaker in the future.  Compliance data dated 03/14/2017-04/12/2017 shows compliance greater than 4 hours 83%.  Average usage 5 hours 49 minutes.  Set pressure 5-10 cm.  EPR level 3 AHI 4.8.  ESS 6 which is now within the normal range.  He returns for reevaluation  11/24/16 CDGary A Werner is a 61 y.o. male , seen here as in a referral/ revisit  from Dr. Warrick Parisian for a sleep coinsultation. Russell Werner is a 61 year old Caucasian gentleman who presents with a copy of his previous sleep studies. The patient used to be super obese, and his peak his weight was 440 pounds. He was diagnosed with obesity hypoventilation, congestive heart failure, cardiomyopathy, hypertension and COPD. His last polysomnography was performed as a split night in [place], [place]. His total AHI was 79.8, the oxygen nadir was 69% during REM and 71% in non-REM sleep, and he spent 40.5 minutes in desaturation. His RDI was 101.7 he qualified for split-night protocol and was titrated to BiPAP as he had previously failed CPAP. Beginning at 12 cm water over 8 he was titrated to a final pressure of 18/14. Achieved an AHI of 0.0, but without any sleep at that setting! The interpreting physician stated that also there was severe obstructive sleep apnea and hypoxemia present no optimal pressure was reached. Also that there was a high air leak through a face mask attributed to facial hair. He was given BiPAP at 25/21 and he couldn't tolerate it.   He stopped using it, instead pursued weight loss, and quit smoking. He lost about 100 pounds by using vitamin B supplements, followed by a bariatric surgery 2 years ago. He underwent sleeve surgery, and since then has lost another another additional 130 pounds. He regained 60 pounds over the last 5 months and his wife now has noted him again to snore and has some apneic breathing.   Sleep habits are as follows: Because to bed between 10 and 11:30 pm, he is usually asleep rather promptly, he sleeps on his right side. He has nocturia 1 on average.  Sleeps on one pillow, reports no vivid dreams. Usually he leaves for the bathroom break at around 3 AM but can go back to sleep. He rises at 5 AM. He averages 6 hours of sleep at night, but only if he doesn't sleep in daytime.A daytime nap the last about 45 minutes, usually after lunch   REVIEW OF SYSTEMS: Full 14 system review of systems performed and notable only for those listed, all others are neg:  Constitutional: neg  Cardiovascular: neg Ear/Nose/Throat: neg  Skin: neg Eyes: neg Respiratory: neg Gastroitestinal: neg  Hematology/Lymphatic: neg  Endocrine: neg Musculoskeletal:neg Allergy/Immunology: neg Neurological: neg Psychiatric: neg Sleep : Obstructive sleep apnea with CPAP   ALLERGIES: No Known Allergies  HOME MEDICATIONS: Outpatient Medications Prior to Visit  Medication Sig Dispense Refill  . bisoprolol (ZEBETA) 5 MG tablet Take 0.5 tablets (2.5 mg total) by mouth daily. Take 1/2 of 5 mg tablet daily 45 tablet 3  . Calcium Carbonate (CALTRATE 600  PO) Take 1 tablet by mouth 2 (two) times daily.    Marland Kitchen ENTRESTO 24-26 MG TAKE 1 TABLET BY MOUTH TWICE A DAY 60 tablet 6  . tamsulosin (FLOMAX) 0.4 MG CAPS capsule Take 1 capsule (0.4 mg total) by mouth daily after supper. Take 1 capsule daily 1/2 hour after the same meal daily 90 capsule 0   No facility-administered medications prior to visit.     PAST MEDICAL HISTORY: Past  Medical History:  Diagnosis Date  . Arthritis   . Cancer ([place])    skin  . COPD (chronic obstructive pulmonary disease) ([place])   . Enlarged prostate   . Hypertension   . Melanoma ([place]) 01/30/2017  . Myocardial infarction ([place]) 1998   mild  . Sleep apnea     PAST SURGICAL HISTORY: Past Surgical History:  Procedure Laterality Date  . CARDIAC CATHETERIZATION    . CHOLECYSTECTOMY  2015  . CYST REMOVAL LEG  2013  . HERNIA REPAIR  2014  . KNEE ARTHROSCOPY Left 1972  . LAPAROSCOPIC GASTRIC BAND REMOVAL WITH LAPAROSCOPIC GASTRIC SLEEVE RESECTION  11/05/2014  . MELANOMA EXCISION  2013  . melanoma removal      FAMILY HISTORY: Family History  Problem Relation Age of Onset  . Early death Mother   . Cancer Sister        breast  . HIV Brother   . Cancer Maternal Grandmother        breast  . Stroke Maternal Grandfather   . Cancer Sister        melanoma    SOCIAL HISTORY: Social History   Socioeconomic History  . Marital status: Single    Spouse name: Not on file  . Number of children: Not on file  . Years of education: Not on file  . Highest education level: Not on file  Social Needs  . Financial resource strain: Not on file  . Food insecurity - worry: Not on file  . Food insecurity - inability: Not on file  . Transportation needs - medical: Not on file  . Transportation needs - non-medical: Not on file  Occupational History  . Not on file  Tobacco Use  . Smoking status: Former Smoker    Packs/day: 1.00    Years: 47.00    Pack years: 47.00    Types: Cigarettes, E-cigarettes    Start date: 06/24/1967    Last attempt to quit: 05/02/2014    Years since quitting: 2.9  . Smokeless tobacco: Never Used  Substance and Sexual Activity  . Alcohol use: Yes    Comment: occasional  . Drug use: No  . Sexual activity: Not on file  Other Topics Concern  . Not on file  Social History Narrative  . Not on file     PHYSICAL EXAM  Vitals:   04/14/17 1125  BP: 121/77    Pulse: 61  Resp: 20  Weight: 295 lb 12.8 oz (134.2 kg)  Height: 5\' 10"  (1.778 m)   Body mass index is 42.44 kg/m.  Generalized: Well developed, morbidly obese male in no acute distress  Head: normocephalic and atraumatic,. Oropharynx benign  Neck: Supple,  Musculoskeletal: No deformity   Neurological examination   Mentation: Alert oriented to time, place, history taking. Attention span and concentration appropriate. Recent and remote memory intact.  Follows all commands speech and language fluent.   Cranial nerve II-XII: .Pupils were equal round reactive to light extraocular movements were full, visual field were full on confrontational test. Facial sensation  and strength were normal. hearing was intact to finger rubbing bilaterally. Uvula tongue midline. head turning and shoulder shrug were normal and symmetric.Tongue protrusion into cheek strength was normal. Motor: normal bulk and tone, full strength in the BUE, BLE,  Sensory: normal and symmetric to light touch,   Coordination: finger-nose-finger, heel-to-shin bilaterally, no dysmetria Gait and Station: Rising up from seated position without assistance, normal stance,  moderate stride, good arm swing, smooth turning, able to perform tiptoe, and heel walking without difficulty. Tandem gait is steady  DIAGNOSTIC DATA (LABS, IMAGING, TESTING) - I reviewed patient records, labs, notes, testing and imaging myself where available.  Lab Results  Component Value Date   WBC 6.4 04/13/2017   HGB 13.5 04/13/2017   HCT 40.7 04/13/2017   MCV 90 04/13/2017   PLT 190 04/13/2017      Component Value Date/Time   NA 142 04/13/2017 1519   K 4.4 04/13/2017 1519   CL 104 04/13/2017 1519   CO2 26 04/13/2017 1519   GLUCOSE 105 (H) 04/13/2017 1519   BUN 13 04/13/2017 1519   CREATININE 0.71 (L) 04/13/2017 1519   CALCIUM 8.9 04/13/2017 1519   PROT 6.0 04/13/2017 1519   ALBUMIN 3.7 04/13/2017 1519   AST 16 04/13/2017 1519   ALT 19  04/13/2017 1519   ALKPHOS 61 04/13/2017 1519   BILITOT 0.3 04/13/2017 1519   GFRNONAA 100 04/13/2017 1519   GFRAA 115 04/13/2017 1519   Lab Results  Component Value Date   CHOL 225 (H) 04/13/2017   HDL 48 04/13/2017   LDLCALC 142 (H) 04/13/2017   TRIG 176 (H) 04/13/2017   CHOLHDL 4.7 04/13/2017    Lab Results  Component Value Date   TSH 2.330 11/22/2016      ASSESSMENT AND PLAN  61 y.o. year old male  has a past medical Sleep apnea. here to follow-up for initial CPAP compliance.Compliance data dated 03/14/2017-04/12/2017 shows compliance greater than 4 hours 83%.  Average usage 5 hours 49 minutes.  Set pressure 5-10 cm.  EPR level 3 AHI 4.8.  ESS 6 which is now within the normal range. The patient is a current patient of Dr. Brett Fairy  who is out of the office today . This note is sent to the work in doctor.     CPAP compliance 83%, reviewed and explained results to patient Continue same settings Follow-up in 6 months for repeat compliance Dennie Bible, [facility], [facility], APRN  [facility] [address], [place], [place] [address] [phone]

## 2017-04-13 NOTE — Progress Notes (Signed)
BP 137/87   Pulse 73   Temp 97.8 F (36.6 C) (Oral)   Ht '5\' 10"'  (1.778 m)   Wt 295 lb (133.8 kg)   BMI 42.33 kg/m    Subjective:    Patient ID: Russell Werner, male    DOB: [birth year], 61 y.o.   MRN: [id number]  HPI: Russell Werner is a 61 y.o. male presenting on 04/13/2017 for Hypertension (6 mo f/u)   HPI Hypertension and CHF Patient is currently on bisoprolol and Entresto which are currently also being managed by cardiology, and their blood pressure today is 137/87. Patient denies any lightheadedness or dizziness. Patient denies headaches, blurred vision, chest pains, shortness of breath, or weakness. Denies any side effects from medication and is content with current medication.  He denies any fluids he says his weight has been up slightly recently and he is keeping a close eye on it.  Relevant past medical, surgical, family and social history reviewed and updated as indicated. Interim medical history since our last visit reviewed. Allergies and medications reviewed and updated.  Review of Systems  Constitutional: Negative for chills and fever.  Respiratory: Negative for shortness of breath and wheezing.   Cardiovascular: Negative for chest pain and leg swelling.  Musculoskeletal: Negative for back pain and gait problem.  Skin: Negative for rash.  Neurological: Negative for dizziness, weakness, light-headedness, numbness and headaches.  All other systems reviewed and are negative.   Per HPI unless specifically indicated above        Objective:    BP 137/87   Pulse 73   Temp 97.8 F (36.6 C) (Oral)   Ht '5\' 10"'  (1.778 m)   Wt 295 lb (133.8 kg)   BMI 42.33 kg/m   Wt Readings from Last 3 Encounters:  04/13/17 295 lb (133.8 kg)  02/14/17 285 lb (129.3 kg)  11/24/16 274 lb 8 oz (124.5 kg)    Physical Exam  Constitutional: He is oriented to person, place, and time. He appears well-developed and well-nourished. No distress.  Eyes: Conjunctivae are normal. No  scleral icterus.  Neck: Neck supple. No thyromegaly present.  Cardiovascular: Normal rate, regular rhythm, normal heart sounds and intact distal pulses.  No murmur heard. Pulmonary/Chest: Effort normal and breath sounds normal. No respiratory distress. He has no wheezes. He has no rales.  Musculoskeletal: Normal range of motion. He exhibits no edema.  Lymphadenopathy:    He has no cervical adenopathy.  Neurological: He is alert and oriented to person, place, and time. Coordination normal.  Skin: Skin is warm and dry. No rash noted. He is not diaphoretic.  Psychiatric: He has a normal mood and affect. His behavior is normal.  Nursing note and vitals reviewed.       Assessment & Plan:   Problem List Items Addressed This Visit      Cardiovascular and Mediastinum   CHF (congestive heart failure) ([place])   Relevant Orders   CMP14+EGFR   Lipid panel   CBC with Differential/Platelet   Hypertension - Primary   Relevant Orders   CMP14+EGFR   Lipid panel   CBC with Differential/Platelet    Other Visit Diagnoses    Need for hepatitis C screening test       Relevant Orders   Hepatitis C antibody   Screening for HIV without presence of risk factors       Relevant Orders   HIV antibody       Follow up plan: Return in about 6 months (  around 10/12/2017), or if symptoms worsen or fail to improve, for Hypertension CHF.  Counseling provided for all of the vaccine components Orders Placed This Encounter  Procedures  . CMP14+EGFR  . Lipid panel  . CBC with Differential/Platelet  . HIV antibody  . Hepatitis C antibody    Caryl Pina, MD [facility] 04/13/2017, 2:23 PM

## 2017-04-14 ENCOUNTER — Other Ambulatory Visit: Payer: Self-pay

## 2017-04-14 ENCOUNTER — Telehealth: Payer: Self-pay | Admitting: *Deleted

## 2017-04-14 ENCOUNTER — Encounter: Payer: Self-pay | Admitting: Nurse Practitioner

## 2017-04-14 ENCOUNTER — Ambulatory Visit: Payer: Medicare HMO | Admitting: Nurse Practitioner

## 2017-04-14 DIAGNOSIS — G4733 Obstructive sleep apnea (adult) (pediatric): Secondary | ICD-10-CM | POA: Diagnosis not present

## 2017-04-14 DIAGNOSIS — Z9989 Dependence on other enabling machines and devices: Secondary | ICD-10-CM

## 2017-04-14 DIAGNOSIS — I519 Heart disease, unspecified: Secondary | ICD-10-CM

## 2017-04-14 LAB — HIV ANTIBODY (ROUTINE TESTING W REFLEX): HIV SCREEN 4TH GENERATION: NONREACTIVE

## 2017-04-14 LAB — CBC WITH DIFFERENTIAL/PLATELET
BASOS ABS: 0.1 10*3/uL (ref 0.0–0.2)
Basos: 1 %
EOS (ABSOLUTE): 0.1 10*3/uL (ref 0.0–0.4)
Eos: 2 %
HEMOGLOBIN: 13.5 g/dL (ref 13.0–17.7)
Hematocrit: 40.7 % (ref 37.5–51.0)
IMMATURE GRANS (ABS): 0 10*3/uL (ref 0.0–0.1)
Immature Granulocytes: 0 %
LYMPHS: 22 %
Lymphocytes Absolute: 1.4 10*3/uL (ref 0.7–3.1)
MCH: 29.9 pg (ref 26.6–33.0)
MCHC: 33.2 g/dL (ref 31.5–35.7)
MCV: 90 fL (ref 79–97)
MONOCYTES: 5 %
Monocytes Absolute: 0.3 10*3/uL (ref 0.1–0.9)
NEUTROS ABS: 4.5 10*3/uL (ref 1.4–7.0)
Neutrophils: 70 %
Platelets: 190 10*3/uL (ref 150–379)
RBC: 4.51 x10E6/uL (ref 4.14–5.80)
RDW: 13.5 % (ref 12.3–15.4)
WBC: 6.4 10*3/uL (ref 3.4–10.8)

## 2017-04-14 LAB — CMP14+EGFR
A/G RATIO: 1.6 (ref 1.2–2.2)
ALT: 19 IU/L (ref 0–44)
AST: 16 IU/L (ref 0–40)
Albumin: 3.7 g/dL (ref 3.6–4.8)
Alkaline Phosphatase: 61 IU/L (ref 39–117)
BILIRUBIN TOTAL: 0.3 mg/dL (ref 0.0–1.2)
BUN/Creatinine Ratio: 18 (ref 10–24)
BUN: 13 mg/dL (ref 8–27)
CALCIUM: 8.9 mg/dL (ref 8.6–10.2)
CHLORIDE: 104 mmol/L (ref 96–106)
CO2: 26 mmol/L (ref 20–29)
Creatinine, Ser: 0.71 mg/dL — ABNORMAL LOW (ref 0.76–1.27)
GFR calc Af Amer: 115 mL/min/{1.73_m2} (ref >59)
GFR, EST NON AFRICAN AMERICAN: 100 mL/min/{1.73_m2} (ref >59)
GLOBULIN, TOTAL: 2.3 g/dL (ref 1.5–4.5)
Glucose: 105 mg/dL — ABNORMAL HIGH (ref 65–99)
POTASSIUM: 4.4 mmol/L (ref 3.5–5.2)
SODIUM: 142 mmol/L (ref 134–144)
Total Protein: 6 g/dL (ref 6.0–8.5)

## 2017-04-14 LAB — LIPID PANEL
CHOL/HDL RATIO: 4.7 ratio (ref 0.0–5.0)
Cholesterol, Total: 225 mg/dL — ABNORMAL HIGH (ref 100–199)
HDL: 48 mg/dL (ref >39)
LDL Calculated: 142 mg/dL — ABNORMAL HIGH (ref 0–99)
TRIGLYCERIDES: 176 mg/dL — AB (ref 0–149)
VLDL Cholesterol Cal: 35 mg/dL (ref 5–40)

## 2017-04-14 LAB — HEPATITIS C ANTIBODY

## 2017-04-14 NOTE — Telephone Encounter (Signed)
Referral put in Epic & [facility] made aware for scheduling.

## 2017-04-14 NOTE — Progress Notes (Signed)
I reviewed note and agree with plan.   Penni Bombard, MD [id number]/44/6190, 1:22 PM Certified in Neurology, Neurophysiology and Neuroimaging  [facility] [address] [place], [place] [address] [phone]

## 2017-04-14 NOTE — Telephone Encounter (Signed)
Notes recorded by Laurine Blazer, LPN on 04/24/4974 at 8:53 AM EST Patient notified. Copy to pmd. He agrees to EP referral.   ------  Notes recorded by Herminio Commons, MD on 04/12/2017 at 2:20 PM EST LV function remains severely reduced. Please make EP referral for AICD candidacy.

## 2017-04-14 NOTE — Patient Instructions (Signed)
CPAP compliance 83% Continue same settings Follow-up in 6 months for repeat compliance

## 2017-04-17 ENCOUNTER — Other Ambulatory Visit: Payer: Self-pay

## 2017-04-17 MED ORDER — ROSUVASTATIN CALCIUM 20 MG PO TABS: 20.0000 mg | ORAL_TABLET | Freq: Every day | ORAL | 1 refills | Status: DC

## 2017-04-19 ENCOUNTER — Ambulatory Visit: Payer: Medicare HMO | Admitting: Internal Medicine

## 2017-04-19 ENCOUNTER — Encounter: Payer: Self-pay | Admitting: Internal Medicine

## 2017-04-19 ENCOUNTER — Other Ambulatory Visit: Payer: Self-pay | Admitting: *Deleted

## 2017-04-19 VITALS — BP 134/78 | HR 66 | Ht 70.0 in | Wt 296.6 lb

## 2017-04-19 DIAGNOSIS — N400 Enlarged prostate without lower urinary tract symptoms: Secondary | ICD-10-CM

## 2017-04-19 DIAGNOSIS — I429 Cardiomyopathy, unspecified: Secondary | ICD-10-CM

## 2017-04-19 DIAGNOSIS — G4733 Obstructive sleep apnea (adult) (pediatric): Secondary | ICD-10-CM | POA: Diagnosis not present

## 2017-04-19 DIAGNOSIS — I5022 Chronic systolic (congestive) heart failure: Secondary | ICD-10-CM | POA: Diagnosis not present

## 2017-04-19 DIAGNOSIS — I519 Heart disease, unspecified: Secondary | ICD-10-CM | POA: Diagnosis not present

## 2017-04-19 LAB — CBC WITH DIFFERENTIAL/PLATELET
BASOS ABS: 0.1 10*3/uL (ref 0.0–0.2)
Basos: 1 %
EOS (ABSOLUTE): 0.1 10*3/uL (ref 0.0–0.4)
EOS: 2 %
HEMATOCRIT: 38.9 % (ref 37.5–51.0)
Hemoglobin: 13.6 g/dL (ref 13.0–17.7)
IMMATURE GRANULOCYTES: 0 %
Immature Grans (Abs): 0 10*3/uL (ref 0.0–0.1)
Lymphocytes Absolute: 1.7 10*3/uL (ref 0.7–3.1)
Lymphs: 24 %
MCH: 30.5 pg (ref 26.6–33.0)
MCHC: 35 g/dL (ref 31.5–35.7)
MCV: 87 fL (ref 79–97)
MONOS ABS: 0.4 10*3/uL (ref 0.1–0.9)
Monocytes: 6 %
NEUTROS PCT: 67 %
Neutrophils Absolute: 4.9 10*3/uL (ref 1.4–7.0)
PLATELETS: 205 10*3/uL (ref 150–379)
RBC: 4.46 x10E6/uL (ref 4.14–5.80)
RDW: 13.1 % (ref 12.3–15.4)
WBC: 7.2 10*3/uL (ref 3.4–10.8)

## 2017-04-19 LAB — BASIC METABOLIC PANEL
BUN/Creatinine Ratio: 21 (ref 10–24)
BUN: 16 mg/dL (ref 8–27)
CHLORIDE: 104 mmol/L (ref 96–106)
CO2: 26 mmol/L (ref 20–29)
CREATININE: 0.78 mg/dL (ref 0.76–1.27)
Calcium: 8.9 mg/dL (ref 8.6–10.2)
GFR calc Af Amer: 111 mL/min/{1.73_m2} (ref >59)
GFR calc non Af Amer: 96 mL/min/{1.73_m2} (ref >59)
GLUCOSE: 109 mg/dL — AB (ref 65–99)
Potassium: 4.5 mmol/L (ref 3.5–5.2)
SODIUM: 142 mmol/L (ref 134–144)

## 2017-04-19 LAB — HGB A1C W/O EAG: HEMOGLOBIN A1C: 5.3 % (ref 4.8–5.6)

## 2017-04-19 LAB — SPECIMEN STATUS REPORT

## 2017-04-19 MED ORDER — TAMSULOSIN HCL 0.4 MG PO CAPS: 0.4000 mg | ORAL_CAPSULE | Freq: Every day | ORAL | 0 refills | Status: DC

## 2017-04-19 NOTE — Patient Instructions (Signed)
Medication Instructions:  Your physician recommends that you continue on your current medications as directed. Please refer to the Current Medication list given to you today.   Labwork: Your physician recommends that you have lab work today: BMP/CBC   Testing/Procedures: Your physician has recommended that you have a defibrillator inserted. An implantable cardioverter defibrillator (ICD) is a small device that is placed in your chest or, in rare cases, your abdomen. This device uses electrical pulses or shocks to help control life-threatening, irregular heartbeats that could lead the heart to suddenly stop beating (sudden cardiac arrest). Leads are attached to the ICD that goes into your heart. This is done in the hospital and usually requires an overnight stay. Please see the instruction sheet given to you today for more information. --05/03/17  Please arrive at [facility] at 12:00 Okay to have a lite breakfast.  Nothing to eat or drink after 7:30am Do not take any medications the morning of the test Use scrub according to direction Plan for one night stay Will need someone to drive you home at discharge     Follow-Up: Your physician recommends that you schedule a follow-up appointment in: 10-14 days in device clinic and 3 months with Dr Rayann Heman    Cardioverter Defibrillator Implantation An implantable cardioverter defibrillator (ICD) is a small device that is placed under the skin in the chest or abdomen. An ICD consists of a battery, a small computer (pulse generator), and wires (leads) that go into the heart. An ICD is used to detect and correct two types of dangerous irregular heartbeats (arrhythmias):  A rapid heart rhythm (tachycardia).  An arrhythmia in which the lower chambers of the heart (ventricles) contract in an uncoordinated way (fibrillation).  When an ICD detects tachycardia, it sends a low-energy shock to the heart to restore  the heartbeat to normal (cardioversion). This signal is usually painless. If cardioversion does not work or if the ICD detects fibrillation, it delivers a high-energy shock to the heart (defibrillation) to restart the heart. This shock may feel like a strong jolt in the chest. Your health care provider may prescribe an ICD if:  You have had an arrhythmia that originated in the ventricles.  Your heart has been damaged by a disease or heart condition.  Sometimes, ICDs are programmed to act as a device called a pacemaker. Pacemakers can be used to treat a slow heartbeat (bradycardia) or tachycardia by taking over the heart rate with electrical impulses. Tell a health care provider about:  Any allergies you have.  All medicines you are taking, including vitamins, herbs, eye drops, creams, and over-the-counter medicines.  Any problems you or family members have had with anesthetic medicines.  Any blood disorders you have.  Any surgeries you have had.  Any medical conditions you have.  Whether you are pregnant or may be pregnant. What are the risks? Generally, this is a safe procedure. However, problems may occur, including:  Swelling, bleeding, or bruising.  Infection.  Blood clots.  Damage to other structures or organs, such as nerves, blood vessels, or the heart.  Allergic reactions to medicines used during the procedure.  What happens before the procedure? Staying hydrated Follow instructions from your health care provider about hydration, which may include:  Up to 2 hours before the procedure - you may continue to drink clear liquids, such as water, clear fruit juice, black coffee, and plain tea.  Eating and drinking restrictions Follow instructions from your  health care provider about eating and drinking, which may include:  8 hours before the procedure - stop eating heavy meals or foods such as meat, fried foods, or fatty foods.  6 hours before the procedure - stop  eating light meals or foods, such as toast or cereal.  6 hours before the procedure - stop drinking milk or drinks that contain milk.  2 hours before the procedure - stop drinking clear liquids.  Medicine Ask your health care provider about:  Changing or stopping your normal medicines. This is important if you take diabetes medicines or blood thinners.  Taking medicines such as aspirin and ibuprofen. These medicines can thin your blood. Do not take these medicines before your procedure if your doctor tells you not to.  Tests  You may have blood tests.  You may have a test to check the electrical signals in your heart (electrocardiogram, ECG).  You may have imaging tests, such as a chest X-ray. General instructions  For 24 hours before the procedure, stop using products that contain nicotine or tobacco, such as cigarettes and e-cigarettes. If you need help quitting, ask your health care provider.  Plan to have someone take you home from the hospital or clinic.  You may be asked to shower with a germ-killing soap. What happens during the procedure?  To reduce your risk of infection: ? Your health care team will wash or sanitize their hands. ? Your skin will be washed with soap. ? Hair may be removed from the surgical area.  Small monitors will be put on your body. They will be used to check your heart, blood pressure, and oxygen level.  An IV tube will be inserted into one of your veins.  You will be given one or more of the following: ? A medicine to help you relax (sedative). ? A medicine to numb the area (local anesthetic). ? A medicine to make you fall asleep (general anesthetic).  Leads will be guided through a blood vessel into your heart and attached to your heart muscles. Depending on the ICD, the leads may go into one ventricle or they may go into both ventricles and into an upper chamber of the heart. An X-ray machine (fluoroscope) will be usedto help guide the  leads.  A small incision will be made to create a deep pocket under your skin.  The pulse generator will be placed into the pocket.  The ICD will be tested.  The incision will be closed with stitches (sutures), skin glue, or staples.  A bandage (dressing) will be placed over the incision. This procedure may vary among health care providers and hospitals. What happens after the procedure?  Your blood pressure, heart rate, breathing rate, and blood oxygen level will be monitored often until the medicines you were given have worn off.  A chest X-ray will be taken to check that the ICD is in the right place.  You will need to stay in the hospital for 1-2 days so your health care provider can make sure your ICD is working.  Do not drive for 24 hours if you received a sedative. Ask your health care provider when it is safe for you to drive.  You may be given an identification card explaining that you have an ICD. Summary  An implantable cardioverter defibrillator (ICD) is a small device that is placed under the skin in the chest or abdomen. It is used to detect and correct dangerous irregular heartbeats (arrhythmias).  An ICD  consists of a battery, a small computer (pulse generator), and wires (leads) that go into the heart.  When an ICD detects rapid heart rhythm (tachycardia), it sends a low-energy shock to the heart to restore the heartbeat to normal (cardioversion). If cardioversion does not work or if the ICD detects uncoordinated heart contractions (fibrillation), it delivers a high-energy shock to the heart (defibrillation) to restart the heart.  You will need to stay in the hospital for 1-2 days to make sure your ICD is working. This information is not intended to replace advice given to you by your health care provider. Make sure you discuss any questions you have with your health care provider. Document Released: 01/08/2002 Document Revised: 04/27/2016 Document Reviewed:  04/27/2016 Elsevier Interactive Patient Education  2017 [employer].

## 2017-04-19 NOTE — H&P (View-Only) (Signed)
Electrophysiology Office Note   Date:  04/19/2017   ID:  JOSHUWA VECCHIO, DOB Jan 08, 1956, MRN [id number]  PCP:  Dettinger, Fransisca Kaufmann, MD  Cardiologist:  Dr Bronson Ing  Primary Electrophysiologist: Thompson Grayer, MD    Chief Complaint  Patient presents with  . New Patient (Initial Visit)    Chronic systolic heart failure     History of Present Illness: Russell Werner is a 61 y.o. male who presents today for electrophysiology evaluation.   He is referred by Dr Bronson Ing for EP consultation regarding risks of sudden death.  He recently moved to the area from [place] to established care with Dr Bronson Ing.  He has a nonischemic CM.  Though he was told that he had a "mild heart attack" 20 years ago, he has not had obstructive CAD on cath (per patient). He has NYHA Class II symptoms with SOB with moderate activity.  He has a h/o weighing 440 lbs but has undergone bariatric surgery 2016 and has gotten down as low as 220 lbs.  currently he weighs 296 lbs. He has severe sleep apnea and wears CPAP.  Today, he denies symptoms of palpitations, chest pain,   orthopnea, PND, lower extremity edema, claudication, dizziness, presyncope, syncope, bleeding, or neurologic sequela. The patient is tolerating medications without difficulties and is otherwise without complaint today.    Past Medical History:  Diagnosis Date  . Arthritis   . Cancer ([place])    skin  . COPD (chronic obstructive pulmonary disease) ([place])   . Enlarged prostate   . Hypertension   . Melanoma ([place]) 01/30/2017  . Myocardial infarction ([place]) 1998   mild  . Sleep apnea    Past Surgical History:  Procedure Laterality Date  . CARDIAC CATHETERIZATION    . CHOLECYSTECTOMY  2015  . CYST REMOVAL LEG  2013  . HERNIA REPAIR  2014  . KNEE ARTHROSCOPY Left 1972  . LAPAROSCOPIC GASTRIC BAND REMOVAL WITH LAPAROSCOPIC GASTRIC SLEEVE RESECTION  11/05/2014  . MELANOMA EXCISION  2013  . melanoma removal       Current Outpatient  Medications  Medication Sig Dispense Refill  . bisoprolol (ZEBETA) 5 MG tablet Take 0.5 tablets (2.5 mg total) by mouth daily. Take 1/2 of 5 mg tablet daily 45 tablet 3  . Calcium Carbonate (CALTRATE 600 PO) Take 1 tablet by mouth 2 (two) times daily.    Marland Kitchen ENTRESTO 24-26 MG TAKE 1 TABLET BY MOUTH TWICE A DAY 60 tablet 6  . Multiple Vitamin (MULTIVITAMIN) capsule Take 1 capsule by mouth daily.    . rosuvastatin (CRESTOR) 20 MG tablet Take 1 tablet (20 mg total) by mouth daily. 90 tablet 1  . tamsulosin (FLOMAX) 0.4 MG CAPS capsule Take 1 capsule (0.4 mg total) by mouth daily after supper. Take 1 capsule daily 1/2 hour after the same meal daily 90 capsule 0   No current facility-administered medications for this visit.     Allergies:   Patient has no known allergies.   Social History:  The patient  reports that he quit smoking about 2 years ago. His smoking use included cigarettes. He started smoking about 49 years ago. He has a 47.00 pack-year smoking history. he has never used smokeless tobacco. He reports that he drinks alcohol. He reports that he does not use drugs.   Family History:  The patient's  family history includes Cancer in his maternal grandmother, sister, and sister; Early death in his mother; HIV in his brother; Stroke in his maternal grandfather.  ROS:  Please see the history of present illness.   All other systems are personally reviewed and negative.    PHYSICAL EXAM: VS:  BP 134/78   Pulse 66   Ht 5\' 10"  (1.778 m)   Wt 296 lb 9.6 oz (134.5 kg)   SpO2 95%   BMI 42.56 kg/m  , BMI Body mass index is 42.56 kg/m. GEN: Well nourished, well developed, in no acute distress  HEENT: normal  Neck: no JVD, carotid bruits, or masses Cardiac: RRR; no murmurs, rubs, or gallops,no edema  Respiratory:  clear to auscultation bilaterally, normal work of breathing GI: soft, nontender, nondistended, + BS MS: no deformity or atrophy  Skin: warm and dry  Neuro:  Strength and  sensation are intact Psych: euthymic mood, full affect  EKG:  EKG is ordered today. The ekg ordered today is personally reviewed and shows sinus rhythm 66  Bpm, PR 194 msec, QRS 134 msec, IVCD   Recent Labs: 11/22/2016: TSH 2.330 04/13/2017: ALT 19; BUN 13; Creatinine, Ser 0.71; Hemoglobin 13.5; Platelets 190; Potassium 4.4; Sodium 142  personally reviewed   Lipid Panel     Component Value Date/Time   CHOL 225 (H) 04/13/2017 1519   TRIG 176 (H) 04/13/2017 1519   HDL 48 04/13/2017 1519   CHOLHDL 4.7 04/13/2017 1519   LDLCALC 142 (H) 04/13/2017 1519   personally reviewed   Wt Readings from Last 3 Encounters:  04/19/17 296 lb 9.6 oz (134.5 kg)  04/14/17 295 lb 12.8 oz (134.2 kg)  04/13/17 295 lb (133.8 kg)      Other studies personally reviewed: Additional studies/ records that were reviewed today include: Dr Court Joy notes, prior echo  Review of the above records today demonstrates: as above   ASSESSMENT AND PLAN:  1.  The patient has a non ischemic CM (EF 25%), NYHA Class II CHF, and severe OSA.  He is referred by Dr Bronson Ing for risk stratification of sudden death and consideration of ICD implantation.  At this time, he meets SCD-HeFT criteria for ICD implantation for primary prevention of sudden death.  I agree with Dr Prudy Feeler that ICD is appropriate and that given QRS, he is not a candidate for CRT.  I have had a thorough discussion with the patient reviewing options.  The patient has had opportunities to ask questions and have them answered. The patient and I have decided together through a shared decision making process to proceed with ICD at this time.   Risks, benefits, alternatives to ICD implantation were discussed in detail with the patient today. The patient understands that the risks include but are not limited to bleeding, infection, pneumothorax, perforation, tamponade, vascular damage, renal failure, MI, stroke, death, inappropriate shocks, and lead  dislodgement and wishes to proceed.  We will therefore schedule device implantation at the next available time.  2. Obesity Lifestyle modification is advised Body mass index is 42.56 kg/m.  3. Tobacco vapes Cessation would be beneficial   Current medicines are reviewed at length with the patient today.   The patient does not have concerns regarding his medicines.  The following changes were made today:  none   Signed, Thompson Grayer, MD  04/19/2017 10:45 AM     [facility] [address] [place] [address] [phone] (office) [phone] (fax)

## 2017-04-19 NOTE — Progress Notes (Signed)
Electrophysiology Office Note   Date:  04/19/2017   ID:  Russell Werner, DOB 1955-09-22, MRN [id number]  PCP:  Dettinger, Fransisca Kaufmann, MD  Cardiologist:  Dr Bronson Ing  Primary Electrophysiologist: Thompson Grayer, MD    Chief Complaint  Patient presents with  . New Patient (Initial Visit)    Chronic systolic heart failure     History of Present Illness: Russell Werner is a 61 y.o. male who presents today for electrophysiology evaluation.   He is referred by Dr Bronson Ing for EP consultation regarding risks of sudden death.  He recently moved to the area from [place] to established care with Dr Bronson Ing.  He has a nonischemic CM.  Though he was told that he had a "mild heart attack" 20 years ago, he has not had obstructive CAD on cath (per patient). He has NYHA Class II symptoms with SOB with moderate activity.  He has a h/o weighing 440 lbs but has undergone bariatric surgery 2016 and has gotten down as low as 220 lbs.  currently he weighs 296 lbs. He has severe sleep apnea and wears CPAP.  Today, he denies symptoms of palpitations, chest pain,   orthopnea, PND, lower extremity edema, claudication, dizziness, presyncope, syncope, bleeding, or neurologic sequela. The patient is tolerating medications without difficulties and is otherwise without complaint today.    Past Medical History:  Diagnosis Date  . Arthritis   . Cancer ([place])    skin  . COPD (chronic obstructive pulmonary disease) ([place])   . Enlarged prostate   . Hypertension   . Melanoma ([place]) 01/30/2017  . Myocardial infarction ([place]) 1998   mild  . Sleep apnea    Past Surgical History:  Procedure Laterality Date  . CARDIAC CATHETERIZATION    . CHOLECYSTECTOMY  2015  . CYST REMOVAL LEG  2013  . HERNIA REPAIR  2014  . KNEE ARTHROSCOPY Left 1972  . LAPAROSCOPIC GASTRIC BAND REMOVAL WITH LAPAROSCOPIC GASTRIC SLEEVE RESECTION  11/05/2014  . MELANOMA EXCISION  2013  . melanoma removal       Current Outpatient  Medications  Medication Sig Dispense Refill  . bisoprolol (ZEBETA) 5 MG tablet Take 0.5 tablets (2.5 mg total) by mouth daily. Take 1/2 of 5 mg tablet daily 45 tablet 3  . Calcium Carbonate (CALTRATE 600 PO) Take 1 tablet by mouth 2 (two) times daily.    Marland Kitchen ENTRESTO 24-26 MG TAKE 1 TABLET BY MOUTH TWICE A DAY 60 tablet 6  . Multiple Vitamin (MULTIVITAMIN) capsule Take 1 capsule by mouth daily.    . rosuvastatin (CRESTOR) 20 MG tablet Take 1 tablet (20 mg total) by mouth daily. 90 tablet 1  . tamsulosin (FLOMAX) 0.4 MG CAPS capsule Take 1 capsule (0.4 mg total) by mouth daily after supper. Take 1 capsule daily 1/2 hour after the same meal daily 90 capsule 0   No current facility-administered medications for this visit.     Allergies:   Patient has no known allergies.   Social History:  The patient  reports that he quit smoking about 2 years ago. His smoking use included cigarettes. He started smoking about 49 years ago. He has a 47.00 pack-year smoking history. he has never used smokeless tobacco. He reports that he drinks alcohol. He reports that he does not use drugs.   Family History:  The patient's  family history includes Cancer in his maternal grandmother, sister, and sister; Early death in his mother; HIV in his brother; Stroke in his maternal grandfather.  ROS:  Please see the history of present illness.   All other systems are personally reviewed and negative.    PHYSICAL EXAM: VS:  BP 134/78   Pulse 66   Ht 5\' 10"  (1.778 m)   Wt 296 lb 9.6 oz (134.5 kg)   SpO2 95%   BMI 42.56 kg/m  , BMI Body mass index is 42.56 kg/m. GEN: Well nourished, well developed, in no acute distress  HEENT: normal  Neck: no JVD, carotid bruits, or masses Cardiac: RRR; no murmurs, rubs, or gallops,no edema  Respiratory:  clear to auscultation bilaterally, normal work of breathing GI: soft, nontender, nondistended, + BS MS: no deformity or atrophy  Skin: warm and dry  Neuro:  Strength and  sensation are intact Psych: euthymic mood, full affect  EKG:  EKG is ordered today. The ekg ordered today is personally reviewed and shows sinus rhythm 66  Bpm, PR 194 msec, QRS 134 msec, IVCD   Recent Labs: 11/22/2016: TSH 2.330 04/13/2017: ALT 19; BUN 13; Creatinine, Ser 0.71; Hemoglobin 13.5; Platelets 190; Potassium 4.4; Sodium 142  personally reviewed   Lipid Panel     Component Value Date/Time   CHOL 225 (H) 04/13/2017 1519   TRIG 176 (H) 04/13/2017 1519   HDL 48 04/13/2017 1519   CHOLHDL 4.7 04/13/2017 1519   LDLCALC 142 (H) 04/13/2017 1519   personally reviewed   Wt Readings from Last 3 Encounters:  04/19/17 296 lb 9.6 oz (134.5 kg)  04/14/17 295 lb 12.8 oz (134.2 kg)  04/13/17 295 lb (133.8 kg)      Other studies personally reviewed: Additional studies/ records that were reviewed today include: Dr Court Joy notes, prior echo  Review of the above records today demonstrates: as above   ASSESSMENT AND PLAN:  1.  The patient has a non ischemic CM (EF 25%), NYHA Class II CHF, and severe OSA.  He is referred by Dr Bronson Ing for risk stratification of sudden death and consideration of ICD implantation.  At this time, he meets SCD-HeFT criteria for ICD implantation for primary prevention of sudden death.  I agree with Dr Prudy Feeler that ICD is appropriate and that given QRS, he is not a candidate for CRT.  I have had a thorough discussion with the patient reviewing options.  The patient has had opportunities to ask questions and have them answered. The patient and I have decided together through a shared decision making process to proceed with ICD at this time.   Risks, benefits, alternatives to ICD implantation were discussed in detail with the patient today. The patient understands that the risks include but are not limited to bleeding, infection, pneumothorax, perforation, tamponade, vascular damage, renal failure, MI, stroke, death, inappropriate shocks, and lead  dislodgement and wishes to proceed.  We will therefore schedule device implantation at the next available time.  2. Obesity Lifestyle modification is advised Body mass index is 42.56 kg/m.  3. Tobacco vapes Cessation would be beneficial   Current medicines are reviewed at length with the patient today.   The patient does not have concerns regarding his medicines.  The following changes were made today:  none   Signed, Thompson Grayer, MD  04/19/2017 10:45 AM     [facility] [address] [place] [address] [phone] (office) [phone] (fax)

## 2017-04-21 ENCOUNTER — Telehealth: Payer: Self-pay | Admitting: *Deleted

## 2017-04-21 NOTE — Telephone Encounter (Signed)
LMOVM TO CALL BACK FOR RESULTS 

## 2017-04-21 NOTE — Telephone Encounter (Signed)
-----   Message from Thompson Grayer, MD sent at 04/20/2017 12:10 AM EST ----- Results reviewed.  Russell Werner, please inform pt of result. I will route to primary care also.

## 2017-05-03 ENCOUNTER — Encounter (HOSPITAL_COMMUNITY): Payer: Self-pay | Admitting: General Practice

## 2017-05-03 ENCOUNTER — Other Ambulatory Visit: Payer: Self-pay

## 2017-05-03 ENCOUNTER — Encounter (HOSPITAL_COMMUNITY)
Admission: RE | Disposition: A | Discharge status: Home / Self Care | Payer: Self-pay | Source: Ambulatory Visit | Attending: Internal Medicine

## 2017-05-03 ENCOUNTER — Ambulatory Visit (HOSPITAL_COMMUNITY)
Admission: RE | Admit: 2017-05-03 | Discharge: 2017-05-04 | Disposition: A | Discharge status: Home / Self Care | Payer: Medicare HMO | Source: Ambulatory Visit | Attending: Internal Medicine | Admitting: Internal Medicine

## 2017-05-03 DIAGNOSIS — Z6841 Body Mass Index (BMI) 40.0 and over, adult: Secondary | ICD-10-CM | POA: Insufficient documentation

## 2017-05-03 DIAGNOSIS — F17290 Nicotine dependence, other tobacco product, uncomplicated: Secondary | ICD-10-CM | POA: Diagnosis not present

## 2017-05-03 DIAGNOSIS — I428 Other cardiomyopathies: Secondary | ICD-10-CM

## 2017-05-03 DIAGNOSIS — Z85820 Personal history of malignant melanoma of skin: Secondary | ICD-10-CM | POA: Insufficient documentation

## 2017-05-03 DIAGNOSIS — Z79899 Other long term (current) drug therapy: Secondary | ICD-10-CM | POA: Insufficient documentation

## 2017-05-03 DIAGNOSIS — Z9884 Bariatric surgery status: Secondary | ICD-10-CM | POA: Insufficient documentation

## 2017-05-03 DIAGNOSIS — Z959 Presence of cardiac and vascular implant and graft, unspecified: Secondary | ICD-10-CM

## 2017-05-03 DIAGNOSIS — I5022 Chronic systolic (congestive) heart failure: Secondary | ICD-10-CM | POA: Diagnosis present

## 2017-05-03 DIAGNOSIS — N400 Enlarged prostate without lower urinary tract symptoms: Secondary | ICD-10-CM | POA: Insufficient documentation

## 2017-05-03 DIAGNOSIS — Z006 Encounter for examination for normal comparison and control in clinical research program: Secondary | ICD-10-CM | POA: Diagnosis not present

## 2017-05-03 DIAGNOSIS — I252 Old myocardial infarction: Secondary | ICD-10-CM | POA: Diagnosis not present

## 2017-05-03 DIAGNOSIS — I110 Hypertensive heart disease with heart failure: Secondary | ICD-10-CM | POA: Diagnosis not present

## 2017-05-03 DIAGNOSIS — J449 Chronic obstructive pulmonary disease, unspecified: Secondary | ICD-10-CM | POA: Diagnosis not present

## 2017-05-03 DIAGNOSIS — E669 Obesity, unspecified: Secondary | ICD-10-CM | POA: Insufficient documentation

## 2017-05-03 DIAGNOSIS — Z9989 Dependence on other enabling machines and devices: Secondary | ICD-10-CM | POA: Insufficient documentation

## 2017-05-03 DIAGNOSIS — G4733 Obstructive sleep apnea (adult) (pediatric): Secondary | ICD-10-CM | POA: Diagnosis not present

## 2017-05-03 DIAGNOSIS — Z95810 Presence of automatic (implantable) cardiac defibrillator: Secondary | ICD-10-CM

## 2017-05-03 HISTORY — DX: Pure hypercholesterolemia, unspecified: E78.00

## 2017-05-03 HISTORY — PX: CARDIAC DEFIBRILLATOR PLACEMENT: SHX171

## 2017-05-03 HISTORY — DX: Dependence on other enabling machines and devices: Z99.89

## 2017-05-03 HISTORY — DX: Obstructive sleep apnea (adult) (pediatric): G47.33

## 2017-05-03 HISTORY — DX: Presence of automatic (implantable) cardiac defibrillator: Z95.810

## 2017-05-03 HISTORY — DX: Malignant melanoma of other part of trunk: C43.59

## 2017-05-03 HISTORY — PX: ICD IMPLANT: EP1208

## 2017-05-03 HISTORY — DX: Heart failure, unspecified: I50.9

## 2017-05-03 LAB — SURGICAL PCR SCREEN
MRSA, PCR: NEGATIVE
STAPHYLOCOCCUS AUREUS: NEGATIVE

## 2017-05-03 SURGERY — ICD IMPLANT

## 2017-05-03 MED ORDER — SODIUM CHLORIDE 0.9 % IV SOLN
INTRAVENOUS | Status: DC
  Administered 2017-05-03: 13:00:00 via INTRAVENOUS

## 2017-05-03 MED ORDER — GENTAMICIN SULFATE 40 MG/ML IJ SOLN
80.0000 mg | INTRAMUSCULAR | Status: AC
  Administered 2017-05-03: 80 mg

## 2017-05-03 MED ORDER — SODIUM CHLORIDE 0.9% FLUSH
3.0000 mL | Freq: Two times a day (BID) | INTRAVENOUS | Status: DC
  Administered 2017-05-03: 3 mL via INTRAVENOUS

## 2017-05-03 MED ORDER — MUPIROCIN 2 % EX OINT
TOPICAL_OINTMENT | CUTANEOUS | Status: AC
  Administered 2017-05-03: 1
  Filled 2017-05-03: qty 22

## 2017-05-03 MED ORDER — CEFAZOLIN SODIUM-DEXTROSE 1-4 GM/50ML-% IV SOLN
1.0000 g | Freq: Four times a day (QID) | INTRAVENOUS | Status: AC
  Administered 2017-05-03 – 2017-05-04 (×3): 1 g via INTRAVENOUS
  Filled 2017-05-03 (×3): qty 50

## 2017-05-03 MED ORDER — LIDOCAINE HCL (PF) 1 % IJ SOLN
INTRAMUSCULAR | Status: DC | PRN
  Administered 2017-05-03: 50 mL

## 2017-05-03 MED ORDER — ONDANSETRON HCL 4 MG/2ML IJ SOLN: 4.0000 mg | Freq: Four times a day (QID) | INTRAMUSCULAR | Status: DC | PRN

## 2017-05-03 MED ORDER — DEXTROSE 5 % IV SOLN
3.0000 g | INTRAVENOUS | Status: AC
  Administered 2017-05-03: 3 g via INTRAVENOUS
  Filled 2017-05-03 (×2): qty 3000

## 2017-05-03 MED ORDER — TAMSULOSIN HCL 0.4 MG PO CAPS
0.4000 mg | ORAL_CAPSULE | Freq: Every day | ORAL | Status: DC
  Administered 2017-05-03: 0.4 mg via ORAL
  Filled 2017-05-03: qty 1

## 2017-05-03 MED ORDER — HYDROCODONE-ACETAMINOPHEN 5-325 MG PO TABS
1.0000 | ORAL_TABLET | ORAL | Status: DC | PRN
  Administered 2017-05-03 – 2017-05-04 (×3): 2 via ORAL
  Filled 2017-05-03 (×3): qty 2

## 2017-05-03 MED ORDER — BISOPROLOL FUMARATE 5 MG PO TABS
2.5000 mg | ORAL_TABLET | Freq: Every day | ORAL | Status: DC
  Administered 2017-05-04: 2.5 mg via ORAL
  Filled 2017-05-03: qty 1

## 2017-05-03 MED ORDER — CHLORHEXIDINE GLUCONATE 4 % EX LIQD: 60.0000 mL | Freq: Once | CUTANEOUS | Status: DC

## 2017-05-03 MED ORDER — SACUBITRIL-VALSARTAN 24-26 MG PO TABS
1.0000 | ORAL_TABLET | Freq: Two times a day (BID) | ORAL | Status: DC
  Administered 2017-05-03 – 2017-05-04 (×2): 1 via ORAL
  Filled 2017-05-03 (×2): qty 1

## 2017-05-03 MED ORDER — HEPARIN (PORCINE) IN NACL 2-0.9 UNIT/ML-% IJ SOLN
INTRAMUSCULAR | Status: AC | PRN
  Administered 2017-05-03: 500 mL

## 2017-05-03 MED ORDER — SODIUM CHLORIDE 0.9 % IR SOLN
Status: AC
  Filled 2017-05-03: qty 2

## 2017-05-03 MED ORDER — ACETAMINOPHEN 325 MG PO TABS: 325.0000 mg | ORAL_TABLET | ORAL | Status: DC | PRN

## 2017-05-03 MED ORDER — SODIUM CHLORIDE 0.9 % IV SOLN: 250.0000 mL | INTRAVENOUS | Status: DC | PRN

## 2017-05-03 MED ORDER — SODIUM CHLORIDE 0.9% FLUSH: 3.0000 mL | INTRAVENOUS | Status: DC | PRN

## 2017-05-03 SURGICAL SUPPLY — 6 items
CABLE SURGICAL S-101-97-12 (CABLE) ×2 IMPLANT
ICD FORTIFY ASSU VR CD1357-40Q (ICD Generator) ×2 IMPLANT
LEAD DURATA 7122Q-65CM (Lead) ×2 IMPLANT
PAD DEFIB LIFELINK (PAD) ×2 IMPLANT
SHEATH CLASSIC 7F (SHEATH) ×2 IMPLANT
TRAY PACEMAKER INSERTION (PACKS) ×2 IMPLANT

## 2017-05-03 NOTE — Interval H&P Note (Signed)
History and Physical Interval Note:  05/03/2017 2:01 PM  [name]  has presented today for surgery, with the diagnosis of cm- lv disfunction  The various methods of treatment have been discussed with the patient and family. After consideration of risks, benefits and other options for treatment, the patient has consented to  Procedure(s): ICD IMPLANT (N/A) as a surgical intervention .  The patient's history has been reviewed, patient examined, no change in status, stable for surgery.  I have reviewed the patient's chart and labs.  Questions were answered to the patient's satisfaction.    ICD Criteria  Current LVEF:25%. Within 12 months prior to implant: Yes   Heart failure history: Yes, Class III  Cardiomyopathy history: Yes, Non-Ischemic Cardiomyopathy.  Atrial Fibrillation/Atrial Flutter: No.  Ventricular tachycardia history: No.  Cardiac arrest history: No.  History of syndromes with risk of sudden death: No.  Previous ICD: No.  Current ICD indication: Primary  PPM indication: No.   Class I or II Bradycardia indication present: No  Beta Blocker therapy for 3 or more months: Yes, prescribed.   Ace Inhibitor/ARB therapy for 3 or more months: Yes, prescribed.      Thompson Grayer

## 2017-05-03 NOTE — Plan of Care (Signed)
VSS and WNL. Pt displays no signs of distress. C/O moderate pain at surgical site well controlled by PRN pain medication

## 2017-05-04 ENCOUNTER — Ambulatory Visit (HOSPITAL_COMMUNITY): Payer: Medicare HMO

## 2017-05-04 ENCOUNTER — Encounter (HOSPITAL_COMMUNITY): Payer: Self-pay | Admitting: Internal Medicine

## 2017-05-04 DIAGNOSIS — Z6841 Body Mass Index (BMI) 40.0 and over, adult: Secondary | ICD-10-CM | POA: Diagnosis not present

## 2017-05-04 DIAGNOSIS — I252 Old myocardial infarction: Secondary | ICD-10-CM | POA: Diagnosis not present

## 2017-05-04 DIAGNOSIS — G4733 Obstructive sleep apnea (adult) (pediatric): Secondary | ICD-10-CM | POA: Diagnosis not present

## 2017-05-04 DIAGNOSIS — I5022 Chronic systolic (congestive) heart failure: Secondary | ICD-10-CM | POA: Diagnosis not present

## 2017-05-04 DIAGNOSIS — J449 Chronic obstructive pulmonary disease, unspecified: Secondary | ICD-10-CM | POA: Diagnosis not present

## 2017-05-04 DIAGNOSIS — I110 Hypertensive heart disease with heart failure: Secondary | ICD-10-CM | POA: Diagnosis not present

## 2017-05-04 DIAGNOSIS — Z950 Presence of cardiac pacemaker: Secondary | ICD-10-CM | POA: Diagnosis not present

## 2017-05-04 DIAGNOSIS — Z9989 Dependence on other enabling machines and devices: Secondary | ICD-10-CM | POA: Diagnosis not present

## 2017-05-04 DIAGNOSIS — I428 Other cardiomyopathies: Secondary | ICD-10-CM

## 2017-05-04 DIAGNOSIS — E669 Obesity, unspecified: Secondary | ICD-10-CM | POA: Diagnosis not present

## 2017-05-04 DIAGNOSIS — Z006 Encounter for examination for normal comparison and control in clinical research program: Secondary | ICD-10-CM | POA: Diagnosis not present

## 2017-05-04 NOTE — Discharge Summary (Signed)
ELECTROPHYSIOLOGY PROCEDURE DISCHARGE SUMMARY    Patient ID: Russell Werner,  MRN: [id number], DOB/AGE: [birth year] 62 y.o.  Admit date: 05/03/2017 Discharge date: 05/04/2017  Primary Care Physician: Dettinger, Fransisca Kaufmann, MD Primary Cardiologist: Bronson Ing Electrophysiologist: Kieon Lawhorn  Primary Discharge Diagnosis:  NICM s/p ICD implant this admission  Secondary Discharge Diagnosis:  1.  COPD 2.  HTN 3.  OSA  No Known Allergies   Procedures This Admission:  1.  Implantation of a STJ single chamber ICD on 05/03/17 by Dr Rayann Heman.  The patient received a STJ model number 8527 ICD with model number 7824M.  There were no immediate post procedure complications. 2.  CXR on 05/04/17 demonstrated no pneumothorax status post device implantation.   Brief HPI: Russell Werner is a 62 y.o. male was referred to electrophysiology in the outpatient setting for consideration of ICD implantation.  Past medical history includes NICM.  The patient has persistent LV dysfunction despite guideline directed therapy.  Risks, benefits, and alternatives to ICD implantation were reviewed with the patient who wished to proceed.   Hospital Course:  The patient was admitted and underwent implantation of a STJ single chamber with details as outlined above. He was monitored on telemetry overnight which demonstrated SR.  Left chest was without hematoma or ecchymosis.  The device was interrogated and found to be functioning normally.  CXR was obtained and demonstrated no pneumothorax status post device implantation.  Wound care, arm mobility, and restrictions were reviewed with the patient.  The patient was examined and considered stable for discharge to home.   The patient's discharge medications include an ARB Delene Loll) and beta blocker (Bisoprolol).   Physical Exam: Vitals:   05/03/17 2035 05/03/17 2105 05/03/17 2110 05/04/17 0559  BP: 129/64 130/67 130/67 118/66  Pulse: 79 72 74 73  Resp: 16 13 19 13   Temp:    98.4 F (36.9 C) (!) 97.4 F (36.3 C)  TempSrc:   Oral Oral  SpO2: 97% 95% 97% 98%  Weight:    297 lb 4.8 oz (134.9 kg)  Height:        GEN- The patient is well appearing, alert and oriented x 3 today.   HEENT: normocephalic, atraumatic; sclera clear, conjunctiva pink; hearing intact; oropharynx clear; neck supple Lungs- Clear to ausculation bilaterally, normal work of breathing.  No wheezes, rales, rhonchi Heart- Regular rate and rhythm, no murmurs, rubs or gallops GI- soft, non-tender, non-distended, bowel sounds present Extremities- no clubbing, cyanosis, or edema MS- no significant deformity or atrophy Skin- warm and dry, no rash or lesion, left chest without hematoma/ecchymosis Psych- euthymic mood, full affect Neuro- strength and sensation are intact   Labs:   Lab Results  Component Value Date   WBC 7.2 04/19/2017   HGB 13.6 04/19/2017   HCT 38.9 04/19/2017   MCV 87 04/19/2017   PLT 205 04/19/2017   No results for input(s): NA, K, CL, CO2, BUN, CREATININE, CALCIUM, PROT, BILITOT, ALKPHOS, ALT, AST, GLUCOSE in the last 168 hours.  Invalid input(s): LABALBU  Discharge Medications:  Allergies as of 05/04/2017   No Known Allergies     Medication List    TAKE these medications   bisoprolol 5 MG tablet Commonly known as:  ZEBETA Take 0.5 tablets (2.5 mg total) by mouth daily. Take 1/2 of 5 mg tablet daily   CALTRATE 600 PO Take 2 tablets by mouth 2 (two) times daily.   ENTRESTO 24-26 MG Generic drug:  sacubitril-valsartan TAKE 1 TABLET BY MOUTH  TWICE A DAY   multivitamin capsule Take 1 capsule by mouth daily.   rosuvastatin 20 MG tablet Commonly known as:  CRESTOR Take 1 tablet (20 mg total) by mouth daily.   tamsulosin 0.4 MG Caps capsule Commonly known as:  FLOMAX Take 1 capsule (0.4 mg total) by mouth daily after supper. Take 1 capsule daily 1/2 hour after the same meal daily       Disposition:  Discharge Instructions    Diet - low sodium heart  healthy   Complete by:  As directed    Increase activity slowly   Complete by:  As directed      Follow-up Information    [facility] Follow up on 05/17/2017.   Specialty:  Cardiology Why:  at 10:30AM Contact information: [address], [place]       Thompson Grayer, MD Follow up on 07/19/2017.   Specialty:  Cardiology Why:  at 10:15AM Contact information: [place] [address] [phone]           Duration of Discharge Encounter: Greater than 30 minutes including physician time.  Signed, Chanetta Marshall, NP 05/04/2017 7:30 AM    I have seen, examined the patient, and reviewed the above assessment and plan.  Changes to above are made where necessary.  Device interrogation is reviewed and normal.  CXR reveals stable leads, no ptx.  Co Sign: Thompson Grayer, MD 05/04/2017 12:04 PM

## 2017-05-04 NOTE — Progress Notes (Signed)
The patient and his wife have been given discharge instructions along with a medication list and what to take today. He has been educated on care for post ICD placement. Will be discharging with his wife via car.   Saddie Benders RN

## 2017-05-04 NOTE — Discharge Instructions (Signed)
° ° °  Supplemental Discharge Instructions for  Pacemaker/Defibrillator Patients  Activity No heavy lifting or vigorous activity with your left/right arm for 6 to 8 weeks.  Do not raise your left/right arm above your head for one week.  Gradually raise your affected arm as drawn below.           __      05/08/17                        05/09/17                     05/10/17                     05/11/17  NO DRIVING for   1 week  ; you may begin driving on    9/67/89 .  WOUND CARE - Keep the wound area clean and dry.  Do not get this area wet for one week. No showers for one week; you may shower on   05/11/17  . - The tape/steri-strips on your wound will fall off; do not pull them off.  No bandage is needed on the site.  DO  NOT apply any creams, oils, or ointments to the wound area. - If you notice any drainage or discharge from the wound, any swelling or bruising at the site, or you develop a fever > 101? F after you are discharged home, call the office at once.  Special Instructions - You are still able to use cellular telephones; use the ear opposite the side where you have your pacemaker/defibrillator.  Avoid carrying your cellular phone near your device. - When traveling through airports, show security personnel your identification card to avoid being screened in the metal detectors.  Ask the security personnel to use the hand wand. - Avoid arc welding equipment, MRI testing (magnetic resonance imaging), TENS units (transcutaneous nerve stimulators).  Call the office for questions about other devices. - Avoid electrical appliances that are in poor condition or are not properly grounded. - Microwave ovens are safe to be near or to operate.  Additional information for defibrillator patients should your device go off: - If your device goes off ONCE and you feel fine afterward, notify the device clinic nurses. - If your device goes off ONCE and you do not feel well afterward, call 911. - If your  device goes off TWICE, call 911. - If your device goes off THREE times in one day, call 911.  DO NOT DRIVE YOURSELF OR A FAMILY MEMBER WITH A DEFIBRILLATOR TO THE HOSPITAL--CALL 911.

## 2017-05-04 NOTE — Consult Note (Signed)
See discharge summary

## 2017-05-08 DIAGNOSIS — L570 Actinic keratosis: Secondary | ICD-10-CM | POA: Diagnosis not present

## 2017-05-08 DIAGNOSIS — Z85828 Personal history of other malignant neoplasm of skin: Secondary | ICD-10-CM | POA: Diagnosis not present

## 2017-05-08 DIAGNOSIS — Z85820 Personal history of malignant melanoma of skin: Secondary | ICD-10-CM | POA: Diagnosis not present

## 2017-05-13 DIAGNOSIS — G4733 Obstructive sleep apnea (adult) (pediatric): Secondary | ICD-10-CM | POA: Diagnosis not present

## 2017-05-17 ENCOUNTER — Ambulatory Visit (INDEPENDENT_AMBULATORY_CARE_PROVIDER_SITE_OTHER): Payer: Medicare HMO | Admitting: *Deleted

## 2017-05-17 DIAGNOSIS — I428 Other cardiomyopathies: Secondary | ICD-10-CM | POA: Diagnosis not present

## 2017-05-17 LAB — CUP PACEART INCLINIC DEVICE CHECK
Date Time Interrogation Session: 20190116124855
HighPow Impedance: 58.5 Ohm
Implantable Lead Location: 753860
Implantable Pulse Generator Implant Date: 20190102
Lead Channel Impedance Value: 612.5 Ohm
Lead Channel Pacing Threshold Amplitude: 0.5 V
Lead Channel Pacing Threshold Pulse Width: 0.5 ms
MDC IDC LEAD IMPLANT DT: 20190102
MDC IDC MSMT BATTERY REMAINING LONGEVITY: 106 mo
MDC IDC MSMT LEADCHNL RV PACING THRESHOLD AMPLITUDE: 0.5 V
MDC IDC MSMT LEADCHNL RV PACING THRESHOLD PULSEWIDTH: 0.5 ms
MDC IDC MSMT LEADCHNL RV SENSING INTR AMPL: 11.5 mV
MDC IDC PG SERIAL: 9786935
MDC IDC SET LEADCHNL RV PACING AMPLITUDE: 3.5 V
MDC IDC SET LEADCHNL RV PACING PULSEWIDTH: 0.5 ms
MDC IDC SET LEADCHNL RV SENSING SENSITIVITY: 0.5 mV
MDC IDC STAT BRADY RV PERCENT PACED: 0 %

## 2017-05-17 NOTE — Progress Notes (Signed)
Wound check appointment. Steri-strips removed. Wound without redness or edema. Incision edges approximated, wound well healed. Normal device function. Threshold, sensing, and impedance consistent with implant measurements. Device programmed at 3.5V for extra safety margin until 3 month visit. Histogram distribution appropriate for patient and level of activity. No ventricular arrhythmias noted. Patient educated about wound care, arm mobility, lifting restrictions, shock plan. ROV with JA 3/20

## 2017-05-18 ENCOUNTER — Other Ambulatory Visit: Payer: Self-pay | Admitting: *Deleted

## 2017-05-18 MED ORDER — BISOPROLOL FUMARATE 5 MG PO TABS: 2.5000 mg | ORAL_TABLET | Freq: Every day | ORAL | 3 refills | Status: DC

## 2017-05-19 ENCOUNTER — Other Ambulatory Visit: Payer: Self-pay

## 2017-05-19 MED ORDER — BISOPROLOL FUMARATE 5 MG PO TABS: 2.5000 mg | ORAL_TABLET | Freq: Every day | ORAL | 3 refills | Status: DC

## 2017-06-13 DIAGNOSIS — G4733 Obstructive sleep apnea (adult) (pediatric): Secondary | ICD-10-CM | POA: Diagnosis not present

## 2017-06-26 ENCOUNTER — Telehealth: Payer: Self-pay | Admitting: Family Medicine

## 2017-06-26 DIAGNOSIS — Z1211 Encounter for screening for malignant neoplasm of colon: Secondary | ICD-10-CM

## 2017-06-26 NOTE — Telephone Encounter (Signed)
Patient is wanting to set up referral for colonascopy. Please advise

## 2017-06-26 NOTE — Telephone Encounter (Signed)
Yes go ahead and do a referral for colonoscopy

## 2017-06-26 NOTE — Telephone Encounter (Signed)
GI referral placed, patient aware

## 2017-06-26 NOTE — Telephone Encounter (Signed)
Ok to place referral for colonoscopy?  

## 2017-07-05 ENCOUNTER — Encounter: Payer: Self-pay | Admitting: Gastroenterology

## 2017-07-11 DIAGNOSIS — G4733 Obstructive sleep apnea (adult) (pediatric): Secondary | ICD-10-CM | POA: Diagnosis not present

## 2017-07-19 ENCOUNTER — Encounter: Payer: Self-pay | Admitting: Internal Medicine

## 2017-07-19 ENCOUNTER — Ambulatory Visit (INDEPENDENT_AMBULATORY_CARE_PROVIDER_SITE_OTHER): Payer: Medicare HMO | Admitting: Internal Medicine

## 2017-07-19 VITALS — BP 98/60 | HR 75 | Ht 70.0 in | Wt 281.2 lb

## 2017-07-19 DIAGNOSIS — G4733 Obstructive sleep apnea (adult) (pediatric): Secondary | ICD-10-CM | POA: Diagnosis not present

## 2017-07-19 DIAGNOSIS — Z95810 Presence of automatic (implantable) cardiac defibrillator: Secondary | ICD-10-CM

## 2017-07-19 DIAGNOSIS — I5022 Chronic systolic (congestive) heart failure: Secondary | ICD-10-CM | POA: Diagnosis not present

## 2017-07-19 DIAGNOSIS — I428 Other cardiomyopathies: Secondary | ICD-10-CM | POA: Diagnosis not present

## 2017-07-19 LAB — CUP PACEART INCLINIC DEVICE CHECK
Date Time Interrogation Session: 20190320113847
HIGH POWER IMPEDANCE MEASURED VALUE: 67.5 Ohm
Implantable Lead Implant Date: 20190102
Implantable Pulse Generator Implant Date: 20190102
Lead Channel Pacing Threshold Amplitude: 0.5 V
Lead Channel Pacing Threshold Amplitude: 0.5 V
Lead Channel Pacing Threshold Pulse Width: 0.5 ms
Lead Channel Pacing Threshold Pulse Width: 0.5 ms
Lead Channel Setting Pacing Pulse Width: 0.5 ms
MDC IDC LEAD LOCATION: 753860
MDC IDC MSMT BATTERY REMAINING LONGEVITY: 105 mo
MDC IDC MSMT LEADCHNL RV IMPEDANCE VALUE: 637.5 Ohm
MDC IDC MSMT LEADCHNL RV SENSING INTR AMPL: 11.5 mV
MDC IDC PG SERIAL: 9786935
MDC IDC SET LEADCHNL RV PACING AMPLITUDE: 2.5 V
MDC IDC SET LEADCHNL RV SENSING SENSITIVITY: 0.5 mV
MDC IDC STAT BRADY RV PERCENT PACED: 0 %

## 2017-07-19 NOTE — Progress Notes (Signed)
PCP: Dettinger, Fransisca Kaufmann, MD Primary Cardiologist:  Dr Bronson Ing Primary EP: Dr Rayann Heman  Russell Werner is a 62 y.o. male who presents today for routine electrophysiology followup.  Since his ICD implant, the patient reports doing very well.  Today, he denies symptoms of palpitations, chest pain, shortness of breath,  lower extremity edema, dizziness, presyncope, syncope, or ICD shocks.  The patient is otherwise without complaint today.   Past Medical History:  Diagnosis Date  . 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  . Arthritis    "fingers" (05/03/2017)  . CHF (congestive heart failure) ([place])   . COPD (chronic obstructive pulmonary disease) ([place])   . Enlarged prostate   . High cholesterol   . Hypertension   . Melanoma of back ([place]) 2013  . Myocardial infarction ([place]) 1998   mild  . OSA on CPAP    "severe" (05/03/2017)   Past Surgical History:  Procedure Laterality Date  . CARDIAC CATHETERIZATION  X 2  . CARDIAC DEFIBRILLATOR PLACEMENT  05/03/2017  . CYST REMOVAL LEG Bilateral 2013  . HERNIA REPAIR    . ICD IMPLANT N/A 05/03/2017   Procedure: ICD IMPLANT;  Surgeon: Thompson Grayer, MD;  Location: [facility];  Service: Cardiovascular;  Laterality: N/A;  . KNEE CARTILAGE SURGERY Left 1972  . LAPAROSCOPIC CHOLECYSTECTOMY  2015  . LAPAROSCOPIC GASTRIC BAND REMOVAL WITH LAPAROSCOPIC GASTRIC SLEEVE RESECTION  11/05/2014  . MELANOMA EXCISION  2013   "back"  . UMBILICAL HERNIA REPAIR  2014    ROS- all systems are reviewed and negative except as per HPI above  Current Outpatient Medications  Medication Sig Dispense Refill  . bisoprolol (ZEBETA) 5 MG tablet Take 0.5 tablets (2.5 mg total) by mouth daily. Take 1/2 of 5 mg tablet daily 45 tablet 3  . Calcium Carbonate (CALTRATE 600 PO) Take 2 tablets by mouth 2 (two) times daily.     . calcium-vitamin D (OSCAL WITH D) 500-200 MG-UNIT tablet Take 1 tablet by mouth 4 (four) times daily.    Marland Kitchen ENTRESTO 24-26  MG TAKE 1 TABLET BY MOUTH TWICE A DAY 60 tablet 6  . Multiple Vitamin (MULTIVITAMIN) capsule Take 1 capsule by mouth daily.    . rosuvastatin (CRESTOR) 20 MG tablet Take 1 tablet (20 mg total) by mouth daily. 90 tablet 1  . tamsulosin (FLOMAX) 0.4 MG CAPS capsule Take 1 capsule (0.4 mg total) by mouth daily after supper. Take 1 capsule daily 1/2 hour after the same meal daily 90 capsule 0   No current facility-administered medications for this visit.     Physical Exam: Vitals:   07/19/17 1026  BP: 98/60  Pulse: 75  SpO2: 98%  Weight: 281 lb 4 oz (127.6 kg)  Height: 5\' 10"  (1.778 m)    GEN- The patient is well appearing, alert and oriented x 3 today.   Head- normocephalic, atraumatic Eyes-  Sclera clear, conjunctiva pink Ears- hearing intact Oropharynx- clear Lungs- Clear to ausculation bilaterally, normal work of breathing Chest- ICD pocket is well healed Heart- Regular rate and rhythm, no murmurs, rubs or gallops, PMI not laterally displaced GI- soft, NT, ND, + BS Extremities- no clubbing, cyanosis, or edema  ICD interrogation- reviewed in detail today,  See PACEART report  ekg tracing ordered today is personally reviewed and shows sinus rhythm 93 bpm, PR 174 msec, LAHB  Assessment and Plan:  1.  Chronic systolic dysfunction euvolemic today Stable on an appropriate medical regimen Normal ICD function See Claudia Desanctis Art report  No changes today Will enroll in ICM device clinic with Sharman Cheek.  2. Obesity Body mass index is 40.36 kg/m. lifesyle modification encouraged  3. Tobacco Cessation advised  Thompson Grayer MD, [facility] 07/19/2017 10:39 AM

## 2017-07-19 NOTE — Patient Instructions (Addendum)
Medication Instructions:  Your physician recommends that you continue on your current medications as directed. Please refer to the Current Medication list given to you today.  Labwork: None ordered.  Testing/Procedures: None ordered.  Follow-Up: Your physician wants you to follow-up in: 1 year with Dr. Rayann Heman.   You will receive a reminder letter in the mail two months in advance. If you don't receive a letter, please call our office to schedule the follow-up appointment.  Remote monitoring is used to monitor your ICD from home. This monitoring reduces the number of office visits required to check your device to one time per year. It allows [place] to keep an eye on the functioning of your device to ensure it is working properly. You are scheduled for a device check from home on 10/18/2017. You may send your transmission at any time that day. If you have a wireless device, the transmission will be sent automatically. After your physician reviews your transmission, you will receive a postcard with your next transmission date.  Any Other Special Instructions Will Be Listed Below (If Applicable).  If you need a refill on your cardiac medications before your next appointment, please call your pharmacy.

## 2017-07-28 ENCOUNTER — Telehealth: Payer: Self-pay

## 2017-07-28 NOTE — Telephone Encounter (Signed)
Patient referred to [facility] clinic by Dr Rayann Heman.  Attempted ICM intro and left message to return call.  Provided direct number.

## 2017-07-28 NOTE — Telephone Encounter (Signed)
Patient returned call and provided ICM intro.  He agreed to monthly ICM follow up. He reported he's had bariatric surgery over a year ago and was doing really well but has regained 60 lbs in the last year. Current weight is 279 lbs.  He is not currently on a diuretic but has been in the past.  Advised will schedule 1st ICM remote transmission on 08/10/2017.  He has an appointment with Dr Dwana Curd.  Explained another remote will be scheduled before the office visit and sent to Dr Bronson Ing to review at the appointment and that will be a good time to discuss if a diuretic is needed.  His Merlin monitor is by the bedside.  Explained report will be transmission during the night while he is sleeping.  He has the direct ICM number and encouraged to call if he has any fluid symptoms.

## 2017-08-08 ENCOUNTER — Other Ambulatory Visit: Payer: Self-pay | Admitting: Family Medicine

## 2017-08-08 DIAGNOSIS — N400 Enlarged prostate without lower urinary tract symptoms: Secondary | ICD-10-CM

## 2017-08-10 ENCOUNTER — Telehealth: Payer: Self-pay

## 2017-08-10 ENCOUNTER — Ambulatory Visit (INDEPENDENT_AMBULATORY_CARE_PROVIDER_SITE_OTHER): Payer: Self-pay

## 2017-08-10 DIAGNOSIS — I5022 Chronic systolic (congestive) heart failure: Secondary | ICD-10-CM

## 2017-08-10 DIAGNOSIS — Z95810 Presence of automatic (implantable) cardiac defibrillator: Secondary | ICD-10-CM

## 2017-08-10 NOTE — Progress Notes (Signed)
EPIC Encounter for ICM Monitoring  Patient Name: Russell Werner is a 62 y.o. male Date: 08/10/2017 Primary Care Physican: Dettinger, Fransisca Kaufmann, MD Primary Cardiologist: Bronson Ing Electrophysiologist: Allred Dry Weight: Previous weight 279 lbs         1st ICM remote transmission. Attempted call to patient and unable to reach.  Left message to return call regarding transmission.  Transmission reviewed.  Per patient at intro call he has a history of bariatric surgery.    Thoracic impedance normal but was abnormal suggesting fluid accumulation from 07/26/2017 to 08/04/2017.  No diuretic (has taken a diuretic in the past)  Recommendations: NONE - Unable to reach.  Follow-up plan: ICM clinic phone appointment on 09/11/2017.  Office appointment with Dr Bronson Ing on 08/25/2017.    Copy of ICM check sent to Dr. Rayann Heman.   3 month ICM trend: 08/10/2017    1 Year ICM trend:       Rosalene Billings, RN 08/10/2017 4:24 PM

## 2017-08-10 NOTE — Telephone Encounter (Signed)
Remote ICM transmission received.  Attempted call to patient and left message per DPR to return call regarding transmission.  

## 2017-08-11 DIAGNOSIS — G4733 Obstructive sleep apnea (adult) (pediatric): Secondary | ICD-10-CM | POA: Diagnosis not present

## 2017-08-11 NOTE — Progress Notes (Signed)
Received call back from patient.  He is working on his goal of losing weight.  Today's weight 276 lbs.  He did not have any fluid symptoms during decreased impedance.  He is very health conscious of what he eats and mostly eats protein and vegetables. Advised foods such as cheese, bacon and sausage are high in sodium.  No changes today.  Next ICM transmission date will be 08/25/2014 which is day before Dr Court Joy office visit on 08/25/2017.

## 2017-08-16 ENCOUNTER — Other Ambulatory Visit: Payer: Self-pay | Admitting: *Deleted

## 2017-08-16 DIAGNOSIS — N400 Enlarged prostate without lower urinary tract symptoms: Secondary | ICD-10-CM

## 2017-08-16 MED ORDER — TAMSULOSIN HCL 0.4 MG PO CAPS: 0.4000 mg | ORAL_CAPSULE | Freq: Every day | ORAL | 1 refills | Status: DC

## 2017-08-25 ENCOUNTER — Ambulatory Visit: Payer: Medicare HMO | Admitting: Cardiovascular Disease

## 2017-08-31 ENCOUNTER — Ambulatory Visit: Payer: Medicare HMO | Admitting: Gastroenterology

## 2017-09-10 DIAGNOSIS — G4733 Obstructive sleep apnea (adult) (pediatric): Secondary | ICD-10-CM | POA: Diagnosis not present

## 2017-09-11 ENCOUNTER — Ambulatory Visit (INDEPENDENT_AMBULATORY_CARE_PROVIDER_SITE_OTHER): Payer: Medicare HMO

## 2017-09-11 DIAGNOSIS — I5022 Chronic systolic (congestive) heart failure: Secondary | ICD-10-CM

## 2017-09-11 DIAGNOSIS — Z95810 Presence of automatic (implantable) cardiac defibrillator: Secondary | ICD-10-CM | POA: Diagnosis not present

## 2017-09-12 NOTE — Progress Notes (Signed)
EPIC Encounter for ICM Monitoring  Patient Name: MANAS HICKLING is a 62 y.o. male Date: 09/12/2017 Primary Care Physican: Dettinger, Fransisca Kaufmann, MD Primary Cardiologist: Bronson Ing Electrophysiologist: Allred Dry Weight:  281 lbs        Heart Failure questions reviewed, pt asymptomatic.  History of bariatric surgery.    Thoracic impedance normal but was abnormal suggesting fluid accumulation from 08/29/2017 - 09/10/2017.  Prescribed dosage: No diuretic (has taken a diuretic in the past)  Recommendations: No changes.  Reinforced fluid restriction to < 2 L daily and sodium restriction to less than 2000 mg daily.  Encouraged to call for fluid symptoms.  Follow-up plan: ICM clinic phone appointment on 09/28/2017 to recheck fluid levels before office appointment scheduled 09/29/2017 with Dr. Bronson Ing.  Copy of ICM check sent to Dr. Rayann Heman and Dr. Bronson Ing.   3 month ICM trend: 09/11/2017    1 Year ICM trend:       Rosalene Billings, RN 09/12/2017 12:01 PM

## 2017-09-14 ENCOUNTER — Telehealth: Payer: Self-pay

## 2017-09-14 DIAGNOSIS — Z1211 Encounter for screening for malignant neoplasm of colon: Secondary | ICD-10-CM

## 2017-09-14 DIAGNOSIS — M25561 Pain in right knee: Secondary | ICD-10-CM

## 2017-09-14 NOTE — Telephone Encounter (Signed)
Referrals placed and pt is aware.

## 2017-09-14 NOTE — Telephone Encounter (Signed)
Go ahead and do the referrals for him, right knee pain diagnosis for orthopedic gastroenterology for colon cancer screening

## 2017-09-14 NOTE — Addendum Note (Signed)
Addended by: Marylin Crosby on: 09/14/2017 04:42 PM   Modules accepted: Orders

## 2017-09-14 NOTE — Telephone Encounter (Signed)
I have moved to [place] and I need two referrals  One for ortho for r knee pain and another one for colonoscopy

## 2017-09-15 ENCOUNTER — Other Ambulatory Visit: Payer: Self-pay | Admitting: Cardiovascular Disease

## 2017-09-15 ENCOUNTER — Encounter: Payer: Self-pay | Admitting: Gastroenterology

## 2017-09-18 ENCOUNTER — Encounter: Payer: Self-pay | Admitting: Gastroenterology

## 2017-09-19 ENCOUNTER — Telehealth: Payer: Self-pay

## 2017-09-19 NOTE — Telephone Encounter (Signed)
[name],  He does not qualify for care at [facility] and should have his procedure performed in the hospital setting.  Thanks,  [name]

## 2017-09-19 NOTE — Telephone Encounter (Signed)
Pt is schedule for a previst schedule on 6-4. Pt had echo on 04-12-17. EF is 25-30%.  Does this pt need to be schedule at the hospital? M;Jones RN previsit

## 2017-09-20 ENCOUNTER — Telehealth: Payer: Self-pay

## 2017-09-20 NOTE — Telephone Encounter (Signed)
Patient was referred to you for a colonoscopy and was scheduled in the [place] on 10/16/17 and then a new patient appointment with you on 10/17/17.  Just notified this morning that the patient is not eligible for the [facility] and needs to have procedure at the hospital. Do you want him to keep the office appointment and have me schedule the colonoscopy after the OV?  Please advise.  Thank you.

## 2017-09-20 NOTE — Telephone Encounter (Signed)
Dr Lyndel Safe,  Per [name] CRNA, pt needs to have colon at hospital. With this pt's hx do you want an OV or can he be a direct at [facility]?  Please advise, Thanks for your time, Marijean Niemann

## 2017-09-21 ENCOUNTER — Ambulatory Visit (INDEPENDENT_AMBULATORY_CARE_PROVIDER_SITE_OTHER): Payer: Medicare HMO | Admitting: Orthopaedic Surgery

## 2017-09-21 ENCOUNTER — Ambulatory Visit (INDEPENDENT_AMBULATORY_CARE_PROVIDER_SITE_OTHER): Payer: Medicare HMO

## 2017-09-21 VITALS — Ht 70.0 in | Wt 281.0 lb

## 2017-09-21 DIAGNOSIS — M25561 Pain in right knee: Secondary | ICD-10-CM | POA: Diagnosis not present

## 2017-09-21 NOTE — Progress Notes (Addendum)
Office Visit Note   Patient: Russell Werner           Date of Birth: 12/13/1955           MRN: [id number] Visit Date: 09/21/2017              Requested by: Dettinger, Fransisca Kaufmann, MD [place], [place] [id number] PCP: Dettinger, Fransisca Kaufmann, MD   Assessment & Plan: Visit Diagnoses:  1. Acute pain of right knee     Plan: Impression is right knee pain due to acute medial meniscal tear versus arthritis flareup.  Small amount of fluid was aspirated and then injected with cortisone today.  Patient does have a defibrillator so he does not improve from the injection we may need to think about a CT arthrogram.  He will also call us in the future regarding a left total knee replacement which she has wanted to do for a while.  He does have a history of congestive heart failure, COPD, MI.  He does have a defibrillator.  Follow-Up Instructions: Return if symptoms worsen or fail to improve.   Orders:  Orders Placed This Encounter  Procedures  . XR KNEE 3 VIEW RIGHT   No orders of the defined types were placed in this encounter.     Procedures: No procedures performed   Clinical Data: No additional findings.   Subjective: Chief Complaint  Patient presents with  . Right Knee - Pain    Russell Werner is a very pleasant 62 year old gentleman who comes in with 2-week history of right knee pain.  Denies any injuries.  Denies any swelling.  It feels like it wants to give out.  The pain is worse from getting up from sitting.  Denies any numbness and tingling.   Review of Systems  Constitutional: Negative.   All other systems reviewed and are negative.    Objective: Vital Signs: Ht 5\' 10"  (1.778 m)   Wt 281 lb (127.5 kg)   BMI 40.32 kg/m   Physical Exam  Constitutional: He is oriented to person, place, and time. He appears well-developed and well-nourished.  HENT:  Head: Normocephalic and atraumatic.  Eyes: Pupils are equal, round, and reactive to light.  Neck: Neck supple.    Pulmonary/Chest: Effort normal.  Abdominal: Soft.  Musculoskeletal: Normal range of motion.  Neurological: He is alert and oriented to person, place, and time.  Skin: Skin is warm.  Psychiatric: He has a normal mood and affect. His behavior is normal. Judgment and thought content normal.  Nursing note and vitals reviewed.   Ortho Exam Right knee exam shows a small effusion.  Medial joint line tenderness.  Collaterals and cruciates are stable. Specialty Comments:  No specialty comments available.  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  Overall preserved medial and lateral compartments.  Arthritic changes of the patellofemoral compartment.    PMFS History: Patient Active Problem List   Diagnosis Date Noted  . Nonischemic cardiomyopathy ([place]) 05/03/2017  . Obstructive sleep apnea treated with continuous positive airway pressure (CPAP) 04/14/2017  . Sleep apnea, obstructive 10/12/2016  . CHF (congestive heart failure) ([place]) 10/12/2016  . Hypertension 10/12/2016  . BPH (benign prostatic hyperplasia) 10/12/2016  . History of melanoma 10/12/2016   Past Medical History:  Diagnosis Date  . 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  . Arthritis    "fingers" (05/03/2017)  . CHF (congestive heart failure) ([place])   . COPD (  chronic obstructive pulmonary disease) ([place])   . Enlarged prostate   . High cholesterol   . Hypertension   . Melanoma of back ([place]) 2013  . Myocardial infarction ([place]) 1998   mild  . OSA on CPAP    "severe" (05/03/2017)    Family History  Problem Relation Age of Onset  . Early death Mother   . Cancer Sister        breast  . HIV Brother   . Cancer Maternal Grandmother        breast  . Stroke Maternal Grandfather   . Cancer Sister        melanoma    Past Surgical History:  Procedure Laterality Date  . CARDIAC CATHETERIZATION  X 2  . CARDIAC DEFIBRILLATOR PLACEMENT   05/03/2017  . CYST REMOVAL LEG Bilateral 2013  . HERNIA REPAIR    . ICD IMPLANT N/A 05/03/2017   Procedure: ICD IMPLANT;  Surgeon: Thompson Grayer, MD;  Location: [facility];  Service: Cardiovascular;  Laterality: N/A;  . KNEE CARTILAGE SURGERY Left 1972  . LAPAROSCOPIC CHOLECYSTECTOMY  2015  . LAPAROSCOPIC GASTRIC BAND REMOVAL WITH LAPAROSCOPIC GASTRIC SLEEVE RESECTION  11/05/2014  . MELANOMA EXCISION  2013   "back"  . UMBILICAL HERNIA REPAIR  2014   Social History   Occupational History  . Not on file  Tobacco Use  . Smoking status: Former Smoker    Packs/day: 1.00    Years: 47.00    Pack years: 47.00    Types: Cigarettes    Start date: 06/24/1967    Last attempt to quit: 05/02/2014    Years since quitting: 3.3  . Smokeless tobacco: Never Used  Substance and Sexual Activity  . Alcohol use: Yes    Comment: 05/03/2017 "1-2 glasses of wine/month"  . Drug use: No  . Sexual activity: Yes

## 2017-09-21 NOTE — Telephone Encounter (Signed)
I spoke with the patient this morning and he was in agreement with cancelling the pre-visit (10/03/17) and the colonoscopy (10/16/17) and keeping his new patient appointment with Dr Lyndel Safe already scheduled for 10/17/17.  He can have his appointment for a colonoscopy at the hospital made at that time.

## 2017-09-25 NOTE — Telephone Encounter (Signed)
Lets see him in the office first

## 2017-09-26 ENCOUNTER — Telehealth (INDEPENDENT_AMBULATORY_CARE_PROVIDER_SITE_OTHER): Payer: Self-pay | Admitting: Orthopaedic Surgery

## 2017-09-26 NOTE — Telephone Encounter (Signed)
Please advise 

## 2017-09-26 NOTE — Telephone Encounter (Signed)
Patient called stating that the injections did not work.  He stated that he hurt his knee again over the weekend.  He stated that he can not be any weight on it at all.  He wants to know what needs to be done next.  CB#[phone].  Thank you.

## 2017-09-26 NOTE — Telephone Encounter (Signed)
He has a new patient appointment 10/17/17 and the colonoscopy has been cancelled.

## 2017-09-26 NOTE — Telephone Encounter (Signed)
CT arthrogram of his knee to r/o structural abnl.  Thanks.

## 2017-09-27 ENCOUNTER — Other Ambulatory Visit (INDEPENDENT_AMBULATORY_CARE_PROVIDER_SITE_OTHER): Payer: Self-pay | Admitting: Orthopaedic Surgery

## 2017-09-27 ENCOUNTER — Other Ambulatory Visit (INDEPENDENT_AMBULATORY_CARE_PROVIDER_SITE_OTHER): Payer: Self-pay

## 2017-09-27 DIAGNOSIS — M25561 Pain in right knee: Principal | ICD-10-CM

## 2017-09-27 DIAGNOSIS — G8929 Other chronic pain: Secondary | ICD-10-CM

## 2017-09-27 NOTE — Telephone Encounter (Signed)
I will correct it, but for future reference, when you are needing arthogram its always with contrast and the code for arthogram [id number]. It all good I will take care of it.

## 2017-09-27 NOTE — Telephone Encounter (Signed)
I don't know if I put this in correctly

## 2017-09-28 ENCOUNTER — Ambulatory Visit (INDEPENDENT_AMBULATORY_CARE_PROVIDER_SITE_OTHER): Payer: Self-pay

## 2017-09-28 DIAGNOSIS — I5022 Chronic systolic (congestive) heart failure: Secondary | ICD-10-CM

## 2017-09-28 DIAGNOSIS — Z95810 Presence of automatic (implantable) cardiac defibrillator: Secondary | ICD-10-CM

## 2017-09-28 NOTE — Progress Notes (Signed)
EPIC Encounter for ICM Monitoring  Patient Name: Russell Werner is a 62 y.o. male Date: 09/28/2017 Primary Care Physican: Dettinger, Fransisca Kaufmann, MD Primary Cardiologist:Koneswaran Electrophysiologist:Allred Dry Weight: 281lbs                                                  Heart Failure questions reviewed, pt asymptomatic.   Thoracic impedance returned to normal since last ICM transmission on 09/11/2017  No diuretic (has taken a diuretic in the past)  Recommendations: No changes.   Encouraged to call for fluid symptoms.  Follow-up plan: ICM clinic phone appointment on 10/18/2017.  Office appointment scheduled 09/29/2017 with Dr. Bronson Ing.  Copy of ICM check sent to Dr. Rayann Heman and Dr. Bronson Ing.   3 month ICM trend: 09/28/2017    1 Year ICM trend:       Rosalene Billings, RN 09/28/2017 5:08 PM

## 2017-09-29 ENCOUNTER — Ambulatory Visit (INDEPENDENT_AMBULATORY_CARE_PROVIDER_SITE_OTHER): Payer: Medicare HMO | Admitting: Cardiovascular Disease

## 2017-09-29 ENCOUNTER — Encounter: Payer: Self-pay | Admitting: Cardiovascular Disease

## 2017-09-29 VITALS — BP 120/78 | HR 55 | Ht 70.0 in | Wt 284.4 lb

## 2017-09-29 DIAGNOSIS — I428 Other cardiomyopathies: Secondary | ICD-10-CM

## 2017-09-29 DIAGNOSIS — I5022 Chronic systolic (congestive) heart failure: Secondary | ICD-10-CM

## 2017-09-29 DIAGNOSIS — I10 Essential (primary) hypertension: Secondary | ICD-10-CM

## 2017-09-29 DIAGNOSIS — M25561 Pain in right knee: Secondary | ICD-10-CM

## 2017-09-29 DIAGNOSIS — G4733 Obstructive sleep apnea (adult) (pediatric): Secondary | ICD-10-CM

## 2017-09-29 DIAGNOSIS — Z95810 Presence of automatic (implantable) cardiac defibrillator: Secondary | ICD-10-CM

## 2017-09-29 NOTE — Patient Instructions (Signed)

## 2017-09-29 NOTE — Progress Notes (Signed)
SUBJECTIVE: The patient presents for routine follow-up.  Thoracic impedance was normal yesterday.  He underwent implantation of a [employer] single-chamber ICD on 05/03/2017 by Dr. Rayann Heman.  He has chronic systolic heart failure.  Echocardiogram 04/12/2017: Severely reduced left ventricular systolic function with moderate left ventricular chamber enlargement, LVEF 25 to 30%.  The patient denies any symptoms of chest pain, palpitations, shortness of breath, lightheadedness, dizziness, leg swelling, orthopnea, PND, and syncope.  His biggest issue has been right knee pain.  He requires right knee replacement surgery in the near future and eventually left knee replacement surgery likely in the winter.    Review of Systems: As per "subjective", otherwise negative.  No Known Allergies  Current Outpatient Medications  Medication Sig Dispense Refill  . bisoprolol (ZEBETA) 5 MG tablet Take 0.5 tablets (2.5 mg total) by mouth daily. Take 1/2 of 5 mg tablet daily 45 tablet 3  . Calcium Carbonate (CALTRATE 600 PO) Take 2 tablets by mouth 2 (two) times daily.     . calcium-vitamin D (OSCAL WITH D) 500-200 MG-UNIT tablet Take 1 tablet by mouth 4 (four) times daily.    Marland Kitchen ENTRESTO 24-26 MG TAKE 1 TABLET BY MOUTH TWICE A DAY 60 tablet 6  . Multiple Vitamin (MULTIVITAMIN) capsule Take 1 capsule by mouth daily.    . rosuvastatin (CRESTOR) 20 MG tablet Take 1 tablet (20 mg total) by mouth daily. 90 tablet 1  . tamsulosin (FLOMAX) 0.4 MG CAPS capsule Take 1 capsule (0.4 mg total) by mouth at bedtime. 90 capsule 1   No current facility-administered medications for this visit.     Past Medical History:  Diagnosis Date  . 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  . Arthritis    "fingers" (05/03/2017)  . CHF (congestive heart failure) ([place])   . COPD (chronic obstructive pulmonary disease) ([place])   . Enlarged prostate   . High cholesterol   . Hypertension   . Melanoma of back  ([place]) 2013  . Myocardial infarction ([place]) 1998   mild  . OSA on CPAP    "severe" (05/03/2017)    Past Surgical History:  Procedure Laterality Date  . CARDIAC CATHETERIZATION  X 2  . CARDIAC DEFIBRILLATOR PLACEMENT  05/03/2017  . CYST REMOVAL LEG Bilateral 2013  . HERNIA REPAIR    . ICD IMPLANT N/A 05/03/2017   Procedure: ICD IMPLANT;  Surgeon: Thompson Grayer, MD;  Location: [facility];  Service: Cardiovascular;  Laterality: N/A;  . KNEE CARTILAGE SURGERY Left 1972  . LAPAROSCOPIC CHOLECYSTECTOMY  2015  . LAPAROSCOPIC GASTRIC BAND REMOVAL WITH LAPAROSCOPIC GASTRIC SLEEVE RESECTION  11/05/2014  . MELANOMA EXCISION  2013   "back"  . UMBILICAL HERNIA REPAIR  2014    Social History   Socioeconomic History  . Marital status: Divorced    Spouse name: Not on file  . Number of children: Not on file  . Years of education: Not on file  . Highest education level: Not on file  Occupational History  . Not on file  Social Needs  . Financial resource strain: Not on file  . Food insecurity:    Worry: Not on file    Inability: Not on file  . Transportation needs:    Medical: Not on file    Non-medical: Not on file  Tobacco Use  . Smoking status: Former Smoker    Packs/day: 1.00    Years: 47.00    Pack years: 47.00    Types: Cigarettes  Start date: 06/24/1967    Last attempt to quit: 05/02/2014    Years since quitting: 3.4  . Smokeless tobacco: Never Used  Substance and Sexual Activity  . Alcohol use: Yes    Comment: 05/03/2017 "1-2 glasses of wine/month"  . Drug use: No  . Sexual activity: Yes  Lifestyle  . Physical activity:    Days per week: Not on file    Minutes per session: Not on file  . Stress: Not on file  Relationships  . Social connections:    Talks on phone: Not on file    Gets together: Not on file    Attends religious service: Not on file    Active member of club or organization: Not on file    Attends meetings of clubs or organizations: Not on file     Relationship status: Not on file  . Intimate partner violence:    Fear of current or ex partner: Not on file    Emotionally abused: Not on file    Physically abused: Not on file    Forced sexual activity: Not on file  Other Topics Concern  . Not on file  Social History Narrative  . Not on file     Vitals:   09/29/17 1100  BP: 120/78  Pulse: (!) 55  SpO2: 93%  Weight: 284 lb 6.4 oz (129 kg)  Height: 5\' 10"  (1.778 m)    Wt Readings from Last 3 Encounters:  09/29/17 284 lb 6.4 oz (129 kg)  09/21/17 281 lb (127.5 kg)  07/19/17 281 lb 4 oz (127.6 kg)     PHYSICAL EXAM General: NAD HEENT: Normal. Neck: No JVD, no thyromegaly. Lungs: Clear to auscultation bilaterally with normal respiratory effort. CV: Regular rate and rhythm, normal S1/S2, no S3/S4, no murmur. No pretibial or periankle edema.   Abdomen: Soft, nontender, no distention.  Neurologic: Alert and oriented.  Psych: Normal affect. Skin: Normal. Musculoskeletal: Right knee in brace.    ECG: Most recent ECG reviewed.   Labs: Lab Results  Component Value Date/Time   K 4.5 04/19/2017 11:34 AM   BUN 16 04/19/2017 11:34 AM   CREATININE 0.78 04/19/2017 11:34 AM   ALT 19 04/13/2017 03:19 PM   TSH 2.330 11/22/2016 12:00 AM   HGB 13.6 04/19/2017 11:34 AM     Lipids: Lab Results  Component Value Date/Time   LDLCALC 142 (H) 04/13/2017 03:19 PM   CHOL 225 (H) 04/13/2017 03:19 PM   TRIG 176 (H) 04/13/2017 03:19 PM   HDL 48 04/13/2017 03:19 PM       ASSESSMENT AND PLAN:  1. Cardiomyopathy/chronic systolic heart failure: He is on bisoprolol and Entresto.  He now has a defibrillator as noted above. He is symptomatically stable. He has not needed diuretics since undergoing bariatric surgery.  2. Hypertension: Controlled on present therapy. No changes.  3. Severe sleep apnea: Uses CPAP.  4.  Right knee pain: He requires right knee replacement surgery.  He can proceed without noninvasive cardiac  testing.  5.  ICD: No shocks from device.    Disposition: Follow up 6 months   Kate Sable, M.D., F.A.C.C.

## 2017-10-02 ENCOUNTER — Other Ambulatory Visit: Payer: Self-pay

## 2017-10-02 ENCOUNTER — Emergency Department (HOSPITAL_BASED_OUTPATIENT_CLINIC_OR_DEPARTMENT_OTHER): Payer: Medicare HMO

## 2017-10-02 ENCOUNTER — Emergency Department (HOSPITAL_BASED_OUTPATIENT_CLINIC_OR_DEPARTMENT_OTHER)
Admission: EM | Admit: 2017-10-02 | Discharge: 2017-10-02 | Disposition: A | Discharge status: Home / Self Care | Payer: Medicare HMO | Attending: Emergency Medicine | Admitting: Emergency Medicine

## 2017-10-02 ENCOUNTER — Encounter (HOSPITAL_BASED_OUTPATIENT_CLINIC_OR_DEPARTMENT_OTHER): Payer: Self-pay | Admitting: Emergency Medicine

## 2017-10-02 ENCOUNTER — Telehealth (INDEPENDENT_AMBULATORY_CARE_PROVIDER_SITE_OTHER): Payer: Self-pay | Admitting: *Deleted

## 2017-10-02 DIAGNOSIS — Z87891 Personal history of nicotine dependence: Secondary | ICD-10-CM | POA: Insufficient documentation

## 2017-10-02 DIAGNOSIS — I509 Heart failure, unspecified: Secondary | ICD-10-CM | POA: Insufficient documentation

## 2017-10-02 DIAGNOSIS — Z79899 Other long term (current) drug therapy: Secondary | ICD-10-CM | POA: Insufficient documentation

## 2017-10-02 DIAGNOSIS — I110 Hypertensive heart disease with heart failure: Secondary | ICD-10-CM | POA: Insufficient documentation

## 2017-10-02 DIAGNOSIS — M25561 Pain in right knee: Secondary | ICD-10-CM

## 2017-10-02 DIAGNOSIS — S8991XA Unspecified injury of right lower leg, initial encounter: Secondary | ICD-10-CM | POA: Diagnosis not present

## 2017-10-02 DIAGNOSIS — J449 Chronic obstructive pulmonary disease, unspecified: Secondary | ICD-10-CM | POA: Insufficient documentation

## 2017-10-02 MED ORDER — HYDROCODONE-ACETAMINOPHEN 5-325 MG PO TABS: 1.0000 | ORAL_TABLET | ORAL | 0 refills | Status: DC | PRN

## 2017-10-02 MED FILL — HYDROCODON-APAP 5-325: 5-325 | 2 days supply | Qty: 10 | Fill #0

## 2017-10-02 NOTE — ED Provider Notes (Signed)
[facility] EMERGENCY DEPARTMENT Provider Note   CSN: [id number] Arrival date & time: 10/02/17  6270     History   Chief Complaint Chief Complaint  Patient presents with  . Knee Pain    HPI Russell Werner is a 62 y.o. male.  The history is provided by the patient and medical records.  Knee Pain   This is a recurrent problem. The current episode started yesterday. The problem occurs constantly. The problem has not changed since onset.The pain is present in the right knee. The quality of the pain is described as aching and sharp. The pain is at a severity of 10/10. The pain is severe. Pertinent negatives include no numbness and no tingling. The symptoms are aggravated by standing and contact. He has tried nothing for the symptoms. The treatment provided no relief.    Past Medical History:  Diagnosis Date  . 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  . Arthritis    "fingers" (05/03/2017)  . CHF (congestive heart failure) ([place])   . COPD (chronic obstructive pulmonary disease) ([place])   . Enlarged prostate   . High cholesterol   . Hypertension   . Melanoma of back ([place]) 2013  . Myocardial infarction ([place]) 1998   mild  . OSA on CPAP    "severe" (05/03/2017)    Patient Active Problem List   Diagnosis Date Noted  . Nonischemic cardiomyopathy ([place]) 05/03/2017  . Obstructive sleep apnea treated with continuous positive airway pressure (CPAP) 04/14/2017  . Sleep apnea, obstructive 10/12/2016  . CHF (congestive heart failure) ([place]) 10/12/2016  . Hypertension 10/12/2016  . BPH (benign prostatic hyperplasia) 10/12/2016  . History of melanoma 10/12/2016    Past Surgical History:  Procedure Laterality Date  . CARDIAC CATHETERIZATION  X 2  . CARDIAC DEFIBRILLATOR PLACEMENT  05/03/2017  . CYST REMOVAL LEG Bilateral 2013  . HERNIA REPAIR    . ICD IMPLANT N/A 05/03/2017   Procedure: ICD IMPLANT;  Surgeon: Thompson Grayer, MD;  Location: [facility];   Service: Cardiovascular;  Laterality: N/A;  . KNEE CARTILAGE SURGERY Left 1972  . LAPAROSCOPIC CHOLECYSTECTOMY  2015  . LAPAROSCOPIC GASTRIC BAND REMOVAL WITH LAPAROSCOPIC GASTRIC SLEEVE RESECTION  11/05/2014  . MELANOMA EXCISION  2013   "back"  . UMBILICAL HERNIA REPAIR  2014        Home Medications    Prior to Admission medications   Medication Sig Start Date End Date Taking? Authorizing Provider  bisoprolol (ZEBETA) 5 MG tablet Take 0.5 tablets (2.5 mg total) by mouth daily. Take 1/2 of 5 mg tablet daily 05/19/17   Herminio Commons, MD  Calcium Carbonate (CALTRATE 600 PO) Take 2 tablets by mouth 2 (two) times daily.     [provider]  calcium-vitamin D (OSCAL WITH D) 500-200 MG-UNIT tablet Take 1 tablet by mouth 4 (four) times daily.    [provider]  ENTRESTO 24-26 MG TAKE 1 TABLET BY MOUTH TWICE A DAY 09/15/17   Herminio Commons, MD  Multiple Vitamin (MULTIVITAMIN) capsule Take 1 capsule by mouth daily.    [provider]  rosuvastatin (CRESTOR) 20 MG tablet Take 1 tablet (20 mg total) by mouth daily. 04/17/17   Dettinger, Fransisca Kaufmann, MD  tamsulosin (FLOMAX) 0.4 MG CAPS capsule Take 1 capsule (0.4 mg total) by mouth at bedtime. 08/16/17   Dettinger, Fransisca Kaufmann, MD    Family History Family History  Problem Relation Age of Onset  . Early death Mother   .  Cancer Sister        breast  . HIV Brother   . Cancer Maternal Grandmother        breast  . Stroke Maternal Grandfather   . Cancer Sister        melanoma    Social History Social History   Tobacco Use  . Smoking status: Former Smoker    Packs/day: 1.00    Years: 47.00    Pack years: 47.00    Types: Cigarettes    Start date: 06/24/1967    Last attempt to quit: 05/02/2014    Years since quitting: 3.4  . Smokeless tobacco: Never Used  Substance Use Topics  . Alcohol use: Yes    Comment: 05/03/2017 "1-2 glasses of wine/month"  . Drug use: No     Allergies   Patient has no known  allergies.   Review of Systems Review of Systems  Constitutional: Negative for activity change, chills, diaphoresis, fatigue and fever.  HENT: Negative for congestion and rhinorrhea.   Eyes: Negative for visual disturbance.  Respiratory: Negative for cough, chest tightness, shortness of breath, wheezing and stridor.   Cardiovascular: Negative for chest pain, palpitations and leg swelling.  Gastrointestinal: Negative for abdominal distention, abdominal pain, blood in stool, constipation, diarrhea, nausea and vomiting.  Genitourinary: Negative for difficulty urinating, dysuria and flank pain.  Musculoskeletal: Negative for back pain, neck pain and neck stiffness.  Skin: Negative for rash and wound.  Neurological: Negative for dizziness, tingling, weakness, light-headedness, numbness and headaches.  Psychiatric/Behavioral: Negative for agitation.  All other systems reviewed and are negative.    Physical Exam Updated Vital Signs BP 125/74 (BP Location: Left Arm)   Pulse 66   Temp 98.7 F (37.1 C) (Oral)   Resp 18   Ht 5\' 10"  (1.778 m)   Wt 127 kg (280 lb)   SpO2 100%   BMI 40.18 kg/m   Physical Exam  Constitutional: He is oriented to person, place, and time. He appears well-developed and well-nourished. No distress.  HENT:  Head: Normocephalic and atraumatic.  Nose: Nose normal.  Eyes: Conjunctivae are normal.  Cardiovascular: Normal rate and regular rhythm.  No murmur heard. Pulmonary/Chest: Effort normal and breath sounds normal. No respiratory distress. He has no wheezes. He exhibits no tenderness.  Abdominal: Soft. There is no tenderness.  Musculoskeletal: He exhibits tenderness. He exhibits no edema or deformity.       Right knee: He exhibits bony tenderness. He exhibits normal range of motion, no swelling, no effusion, no deformity, no laceration and no erythema. Tenderness found. Medial joint line tenderness noted.       Legs: Neurological: He is alert and oriented to  person, place, and time. No sensory deficit. He exhibits normal muscle tone.  Skin: Skin is warm and dry. Capillary refill takes less than 2 seconds. He is not diaphoretic. No erythema. No pallor.  Psychiatric: He has a normal mood and affect.  Nursing note and vitals reviewed.    ED Treatments / Results  Labs (all labs ordered are listed, but only abnormal results are displayed) Labs Reviewed - No data to display  EKG None  Radiology Ct Knee Right Wo Contrast  Result Date: 10/02/2017 CLINICAL DATA:  Status post fall 1 week ago with an injury to the right knee. The patient suffered a subsequent twisting injury of the right knee last night when he tried to pick up a motorcycle. Initial encounter. EXAM: CT OF THE RIGHT KNEE WITHOUT CONTRAST TECHNIQUE: Multidetector CT  imaging of the right knee was performed according to the standard protocol. Multiplanar CT image reconstructions were also generated. COMPARISON:  Plain films of the knees 09/21/2017. FINDINGS: Bones/Joint/Cartilage No acute or focal abnormality is identified. Small osteophytes are present about the knee. Joint spaces are preserved. Ligaments Suboptimally assessed by CT. The cruciate and collateral ligaments appear intact. Muscles and Tendons Normal. Soft tissues No joint effusion or Baker's cyst.  Negative. IMPRESSION: No acute or focal abnormality. Please note that CT scan is not sensitive for the detection of meniscal abnormalities. Ligamentous structures appear intact on this examination although ligaments are also suboptimally assessed by CT. Mild osteoarthritis. Electronically Signed   By: Inge Rise M.D.   On: 10/02/2017 11:24   Dg Knee Complete 4 Views Right  Result Date: 10/02/2017 CLINICAL DATA:  Twisting injury yesterday with medial knee pain, initial encounter EXAM: RIGHT KNEE - COMPLETE 4+ VIEW COMPARISON:  None. FINDINGS: Mild degenerative changes are noted in the medial joint space and patellofemoral space. No  joint effusion is seen. No acute fracture or dislocation is noted. IMPRESSION: Mild degenerative change without acute abnormality. Electronically Signed   By: Inez Catalina M.D.   On: 10/02/2017 09:49    Procedures Procedures (including critical care time)  Medications Ordered in ED Medications - No data to display   Initial Impression / Assessment and Plan / ED Course  I have reviewed the triage vital signs and the nursing notes.  Pertinent labs & imaging results that were available during my care of the patient were reviewed by me and considered in my medical decision making (see chart for details).     JERMON CHALFANT is a 62 y.o. male with a past medical history significant for hypertension, COPD, CHF status post defibrillator, MI, and recent right knee injury who presents with right knee pain.  Patient reports that he has been seen by Dr. Erlinda Hong with orthopedics recently for a right knee injury concerning for right meniscus tear.  He reports that Dr. Erlinda Hong recommended CT scan.  He is in the process of waiting for the imaging but last night he reinjured it.  He reports that he was trying to bear weight on his right knee when he felt it suddenly get worse and he has been having difficulty bearing weight since.  He reports the pain is up to a 10 out of 10.  He denies numbness or tingling distally and denies any discoloration.  He reports that he is having no other areas of pain including no hip pain, abdominal pain or chest pain.  No falls.  He cannot get MRI due to his defibrillator by report.   On exam, patient has right medial knee tenderness.  Tenderness with knee manipulation and movement.  Normal sensation and strength distally.  No tenderness laterally.  Lungs clear and chest nontender.  Abdomen nontender.  No hip tenderness.  Based on his symptoms I am concerned for meniscal injury.  As patient is in the process of being managed by orthopedics, will likely send patient home for further  outpatient management however x-ray was obtained in triage showing no acute fracture or dislocation.  Given the patient's report that he is awaiting a CT scan of his knee (as MRI is not an option with his defibrillator)  and with the reinjury last night with worsened pain than before, CT scan was ordered to look for new occult injury.  Patient drove and does not want pain medicine at this time.  Anticipate  reassessment after imaging.  11:32 AM CT scan did not show any acute abnormalities aside from osteoarthritis.  Patient will remain in his knee brace and use pain medicine ice elevation and compression to treat it.  Patient will follow-up with his orthopedist for further management.  Patient will be discharged for further management by orthopedics team.   Final Clinical Impressions(s) / ED Diagnoses   Final diagnoses:  Acute pain of right knee    ED Discharge Orders    None      Clinical Impression: 1. Acute pain of right knee     Disposition: Discharge  Condition: Good  I have discussed the results, Dx and Tx plan with the pt(& family if present). He/she/they expressed understanding and agree(s) with the plan. Discharge instructions discussed at great length. Strict return precautions discussed and pt &/or family have verbalized understanding of the instructions. No further questions at time of discharge.    New Prescriptions   HYDROCODONE-ACETAMINOPHEN (NORCO/VICODIN) 5-325 MG TABLET    Take 1 tablet by mouth every 4 (four) hours as needed.    Follow Up: Dettinger, Fransisca Kaufmann, MD [place] [address] [phone]     [place] [address] [id number] XE [facility] [place] [phone]       Quinley Nesler, Gwenyth Allegra, MD 10/02/17 1140

## 2017-10-02 NOTE — Telephone Encounter (Signed)
Pt called left vm on my vm wanting to know what the status is on the appeal for the CT/MRI, I C pt back and advised him that is still in appeal and should hear something soon on the decision, pt states that last night he twisted his knee and now can not bear weight on it and it going to the ED in [facility] and have them to check it out and hopefully they will do the CT and will let me know if he had it.I told pt I understood and to let me know if I can assist him in anything.

## 2017-10-02 NOTE — Discharge Instructions (Signed)
Please continue follow-up with your orthopedist for further management of her right knee injury.  Today's images did not show any acute ab normality aside from arthritis.  If any symptoms change or worsen, please return to the nearest emergency department.  Please use the knee brace, ice, and elevate to help with the discomfort.

## 2017-10-03 ENCOUNTER — Telehealth (INDEPENDENT_AMBULATORY_CARE_PROVIDER_SITE_OTHER): Payer: Self-pay | Admitting: Orthopaedic Surgery

## 2017-10-03 NOTE — Telephone Encounter (Signed)
Patient called wanting results from [facility].  Wants someone to call w/results  Please call patient to advise

## 2017-10-03 NOTE — Telephone Encounter (Signed)
Shows mild arthritis.  No fractures.

## 2017-10-03 NOTE — Telephone Encounter (Signed)
See message below °

## 2017-10-04 ENCOUNTER — Other Ambulatory Visit (INDEPENDENT_AMBULATORY_CARE_PROVIDER_SITE_OTHER): Payer: Self-pay

## 2017-10-04 MED ORDER — HYDROCODONE-ACETAMINOPHEN 5-325 MG PO TABS: 1.0000 | ORAL_TABLET | Freq: Every day | ORAL | 0 refills | Status: DC

## 2017-10-04 NOTE — Telephone Encounter (Signed)
One time RX only

## 2017-10-04 NOTE — Telephone Encounter (Signed)
Yes.  Let's have him come in sooner

## 2017-10-04 NOTE — Telephone Encounter (Signed)
See message below. Has a scheduled appt 10/10/17.

## 2017-10-04 NOTE — Telephone Encounter (Signed)
Rx ready for pick up ok per Dr Erlinda Hong

## 2017-10-04 NOTE — Telephone Encounter (Signed)
Med refill  Hydrocodone-acetaminophen(Norco/Vicodin)5-325 mg tablet    Pt called requesting med refill pt is in a lot of pain

## 2017-10-04 NOTE — Telephone Encounter (Signed)
Can he come in before 6/11?>

## 2017-10-04 NOTE — Telephone Encounter (Signed)
See message below. Do you want him to come in for an appt?

## 2017-10-04 NOTE — Telephone Encounter (Signed)
Patient wants to know what Dr. Erlinda Hong will do about his knee, surgery etc. Also, patient is requesting refill on vicodin, states he can barely walk. [phone]

## 2017-10-04 NOTE — Telephone Encounter (Signed)
Scheduled for 6/11

## 2017-10-04 NOTE — Telephone Encounter (Signed)
Please call patient and schedule appt with Dr Erlinda Hong. Will need to be seen first. Thank you.

## 2017-10-10 ENCOUNTER — Encounter (INDEPENDENT_AMBULATORY_CARE_PROVIDER_SITE_OTHER): Payer: Self-pay | Admitting: Orthopaedic Surgery

## 2017-10-10 ENCOUNTER — Ambulatory Visit (INDEPENDENT_AMBULATORY_CARE_PROVIDER_SITE_OTHER): Payer: Medicare HMO | Admitting: Orthopaedic Surgery

## 2017-10-10 DIAGNOSIS — S83241A Other tear of medial meniscus, current injury, right knee, initial encounter: Secondary | ICD-10-CM | POA: Diagnosis not present

## 2017-10-10 NOTE — Progress Notes (Signed)
Office Visit Note   Patient: Russell Werner           Date of Birth: November 17, 1955           MRN: [id number] Visit Date: 10/10/2017              Requested by: Dettinger, Fransisca Kaufmann, MD [place], [place] [address] PCP: Dettinger, Fransisca Kaufmann, MD   Assessment & Plan: Visit Diagnoses:  1. Acute medial meniscus tear of right knee, initial encounter     Plan: At this point patient is presenting with a symptomatic medial meniscal tear.  His x-rays and CT scans have been negative for acute bony abnormalities.  He does not have any significant degenerative joint disease based on CT or x-rays.  Given his defibrillator he is not a candidate for an MRI.  At this point he is presenting clinically with a medial meniscal tear.  We discussed arthroscopic knee surgery with partial medial meniscectomy and debridement as indicated.  Patient understands the risks and benefits of the surgery and wished to proceed.  We will get him scheduled in the near future.  Follow-Up Instructions: Return if symptoms worsen or fail to improve.   Orders:  No orders of the defined types were placed in this encounter.  No orders of the defined types were placed in this encounter.     Procedures: No procedures performed   Clinical Data: No additional findings.   Subjective: Chief Complaint  Patient presents with  . Right Knee - Pain, Follow-up    [name] follows up today for continued right knee pain.  He recently visited the ED and had a CT scan which was negative for fracture.  He also recently saw his cardiologist who cleared him for surgery.   Review of Systems  Constitutional: Negative.   All other systems reviewed and are negative.    Objective: Vital Signs: There were no vitals taken for this visit.  Physical Exam  Constitutional: He is oriented to person, place, and time. He appears well-developed and well-nourished.  Pulmonary/Chest: Effort normal.  Abdominal: Soft.  Neurological: He is  alert and oriented to person, place, and time.  Skin: Skin is warm.  Psychiatric: He has a normal mood and affect. His behavior is normal. Judgment and thought content normal.  Nursing note and vitals reviewed.   Ortho Exam Right knee exam is exquisitely tender along the medial joint line.  He has a trace effusion.  Rest of the knee exam is unremarkable. Specialty Comments:  No specialty comments available.  Imaging: No results found.   PMFS History: Patient Active Problem List   Diagnosis Date Noted  . Acute medial meniscus tear of right knee 10/10/2017  . Nonischemic cardiomyopathy ([place]) 05/03/2017  . Obstructive sleep apnea treated with continuous positive airway pressure (CPAP) 04/14/2017  . Sleep apnea, obstructive 10/12/2016  . CHF (congestive heart failure) ([place]) 10/12/2016  . Hypertension 10/12/2016  . BPH (benign prostatic hyperplasia) 10/12/2016  . History of melanoma 10/12/2016   Past Medical History:  Diagnosis Date  . 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  . Arthritis    "fingers" (05/03/2017)  . CHF (congestive heart failure) ([place])   . COPD (chronic obstructive pulmonary disease) ([place])   . Enlarged prostate   . High cholesterol   . Hypertension   . Melanoma of back ([place]) 2013  . Myocardial infarction ([place]) 1998   mild  . OSA on CPAP    "severe" (05/03/2017)  Family History  Problem Relation Age of Onset  . Early death Mother   . Cancer Sister        breast  . HIV Brother   . Cancer Maternal Grandmother        breast  . Stroke Maternal Grandfather   . Cancer Sister        melanoma    Past Surgical History:  Procedure Laterality Date  . CARDIAC CATHETERIZATION  X 2  . CARDIAC DEFIBRILLATOR PLACEMENT  05/03/2017  . CYST REMOVAL LEG Bilateral 2013  . HERNIA REPAIR    . ICD IMPLANT N/A 05/03/2017   Procedure: ICD IMPLANT;  Surgeon: Thompson Grayer, MD;  Location: [facility];  Service: Cardiovascular;  Laterality: N/A;    . KNEE CARTILAGE SURGERY Left 1972  . LAPAROSCOPIC CHOLECYSTECTOMY  2015  . LAPAROSCOPIC GASTRIC BAND REMOVAL WITH LAPAROSCOPIC GASTRIC SLEEVE RESECTION  11/05/2014  . MELANOMA EXCISION  2013   "back"  . UMBILICAL HERNIA REPAIR  2014   Social History   Occupational History  . Not on file  Tobacco Use  . Smoking status: Former Smoker    Packs/day: 1.00    Years: 47.00    Pack years: 47.00    Types: Cigarettes    Start date: 06/24/1967    Last attempt to quit: 05/02/2014    Years since quitting: 3.4  . Smokeless tobacco: Never Used  Substance and Sexual Activity  . Alcohol use: Yes    Comment: 05/03/2017 "1-2 glasses of wine/month"  . Drug use: No  . Sexual activity: Yes

## 2017-10-10 NOTE — Progress Notes (Deleted)
[facility]  PATIENT: Russell Werner DOB: 09/26/1955   REASON FOR VISIT: Follow-up for obstructive sleep apnea with initial CPAP compliance HISTORY FROM: Patient    HISTORY OF PRESENT ILLNESS:UPDATE 12/14/2018CM [name], 62 year old male returns for follow-up with obstructive sleep apnea initial CPAP compliance.  He claims he has been compliant feels much better during the day and now can attribute his decreased energy level to heart issues and will be getting a pacemaker in the future.  Compliance data dated 03/14/2017-04/12/2017 shows compliance greater than 4 hours 83%.  Average usage 5 hours 49 minutes.  Set pressure 5-10 cm.  EPR level 3 AHI 4.8.  ESS 6 which is now within the normal range.  He returns for reevaluation  11/24/16 CDGary A Diemer is a 62 y.o. male , seen here as in a referral/ revisit  from Dr. Warrick Parisian for a sleep coinsultation. [name] is a 62 year old Caucasian gentleman who presents with a copy of his previous sleep studies. The patient used to be super obese, and his peak his weight was 440 pounds. He was diagnosed with obesity hypoventilation, congestive heart failure, cardiomyopathy, hypertension and COPD. His last polysomnography was performed as a split night in [place], [place]. His total AHI was 79.8, the oxygen nadir was 69% during REM and 71% in non-REM sleep, and he spent 40.5 minutes in desaturation. His RDI was 101.7 he qualified for split-night protocol and was titrated to BiPAP as he had previously failed CPAP. Beginning at 12 cm water over 8 he was titrated to a final pressure of 18/14. Achieved an AHI of 0.0, but without any sleep at that setting! The interpreting physician stated that also there was severe obstructive sleep apnea and hypoxemia present no optimal pressure was reached. Also that there was a high air leak through a face mask attributed to facial hair. He was given BiPAP at 25/21 and he couldn't tolerate it.   He stopped using it, instead pursued weight loss, and quit smoking. He lost about 100 pounds by using vitamin B supplements, followed by a bariatric surgery 2 years ago. He underwent sleeve surgery, and since then has lost another another additional 130 pounds. He regained 60 pounds over the last 5 months and his wife now has noted him again to snore and has some apneic breathing.   Sleep habits are as follows: Because to bed between 10 and 11:30 pm, he is usually asleep rather promptly, he sleeps on his right side. He has nocturia 1 on average.  Sleeps on one pillow, reports no vivid dreams. Usually he leaves for the bathroom break at around 3 AM but can go back to sleep. He rises at 5 AM. He averages 6 hours of sleep at night, but only if he doesn't sleep in daytime.A daytime nap the last about 45 minutes, usually after lunch   REVIEW OF SYSTEMS: Full 14 system review of systems performed and notable only for those listed, all others are neg:  Constitutional: neg  Cardiovascular: neg Ear/Nose/Throat: neg  Skin: neg Eyes: neg Respiratory: neg Gastroitestinal: neg  Hematology/Lymphatic: neg  Endocrine: neg Musculoskeletal:neg Allergy/Immunology: neg Neurological: neg Psychiatric: neg Sleep : Obstructive sleep apnea with CPAP   ALLERGIES: No Known Allergies  HOME MEDICATIONS: Outpatient Medications Prior to Visit  Medication Sig Dispense Refill  . bisoprolol (ZEBETA) 5 MG tablet Take 0.5 tablets (2.5 mg total) by mouth daily. Take 1/2 of 5 mg tablet daily 45 tablet 3  . Calcium Carbonate (CALTRATE 600  PO) Take 2 tablets by mouth 2 (two) times daily.     . calcium-vitamin D (OSCAL WITH D) 500-200 MG-UNIT tablet Take 1 tablet by mouth 4 (four) times daily.    Marland Kitchen ENTRESTO 24-26 MG TAKE 1 TABLET BY MOUTH TWICE A DAY 60 tablet 6  . HYDROcodone-acetaminophen (NORCO/VICODIN) 5-325 MG tablet Take 1 tablet by mouth daily. 5 tablet 0  . Multiple Vitamin (MULTIVITAMIN) capsule Take 1  capsule by mouth daily.    . rosuvastatin (CRESTOR) 20 MG tablet Take 1 tablet (20 mg total) by mouth daily. 90 tablet 1  . tamsulosin (FLOMAX) 0.4 MG CAPS capsule Take 1 capsule (0.4 mg total) by mouth at bedtime. 90 capsule 1   No facility-administered medications prior to visit.     PAST MEDICAL HISTORY: Past Medical History:  Diagnosis Date  . 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  . Arthritis    "fingers" (05/03/2017)  . CHF (congestive heart failure) ([place])   . COPD (chronic obstructive pulmonary disease) ([place])   . Enlarged prostate   . High cholesterol   . Hypertension   . Melanoma of back ([place]) 2013  . Myocardial infarction ([place]) 1998   mild  . OSA on CPAP    "severe" (05/03/2017)    PAST SURGICAL HISTORY: Past Surgical History:  Procedure Laterality Date  . CARDIAC CATHETERIZATION  X 2  . CARDIAC DEFIBRILLATOR PLACEMENT  05/03/2017  . CYST REMOVAL LEG Bilateral 2013  . HERNIA REPAIR    . ICD IMPLANT N/A 05/03/2017   Procedure: ICD IMPLANT;  Surgeon: Thompson Grayer, MD;  Location: [facility];  Service: Cardiovascular;  Laterality: N/A;  . KNEE CARTILAGE SURGERY Left 1972  . LAPAROSCOPIC CHOLECYSTECTOMY  2015  . LAPAROSCOPIC GASTRIC BAND REMOVAL WITH LAPAROSCOPIC GASTRIC SLEEVE RESECTION  11/05/2014  . MELANOMA EXCISION  2013   "back"  . UMBILICAL HERNIA REPAIR  2014    FAMILY HISTORY: Family History  Problem Relation Age of Onset  . Early death Mother   . Cancer Sister        breast  . HIV Brother   . Cancer Maternal Grandmother        breast  . Stroke Maternal Grandfather   . Cancer Sister        melanoma    SOCIAL HISTORY: Social History   Socioeconomic History  . Marital status: Divorced    Spouse name: Not on file  . Number of children: Not on file  . Years of education: Not on file  . Highest education level: Not on file  Occupational History  . Not on file  Social Needs  . Financial resource strain: Not on file   . Food insecurity:    Worry: Not on file    Inability: Not on file  . Transportation needs:    Medical: Not on file    Non-medical: Not on file  Tobacco Use  . Smoking status: Former Smoker    Packs/day: 1.00    Years: 47.00    Pack years: 47.00    Types: Cigarettes    Start date: 06/24/1967    Last attempt to quit: 05/02/2014    Years since quitting: 3.4  . Smokeless tobacco: Never Used  Substance and Sexual Activity  . Alcohol use: Yes    Comment: 05/03/2017 "1-2 glasses of wine/month"  . Drug use: No  . Sexual activity: Yes  Lifestyle  . Physical activity:    Days per week: Not on file    Minutes  per session: Not on file  . Stress: Not on file  Relationships  . Social connections:    Talks on phone: Not on file    Gets together: Not on file    Attends religious service: Not on file    Active member of club or organization: Not on file    Attends meetings of clubs or organizations: Not on file    Relationship status: Not on file  . Intimate partner violence:    Fear of current or ex partner: Not on file    Emotionally abused: Not on file    Physically abused: Not on file    Forced sexual activity: Not on file  Other Topics Concern  . Not on file  Social History Narrative  . Not on file     PHYSICAL EXAM  There were no vitals filed for this visit. There is no height or weight on file to calculate BMI.  Generalized: Well developed, morbidly obese male in no acute distress  Head: normocephalic and atraumatic,. Oropharynx benign  Neck: Supple,  Musculoskeletal: No deformity   Neurological examination   Mentation: Alert oriented to time, place, history taking. Attention span and concentration appropriate. Recent and remote memory intact.  Follows all commands speech and language fluent.   Cranial nerve II-XII: .Pupils were equal round reactive to light extraocular movements were full, visual field were full on confrontational test. Facial sensation and strength  were normal. hearing was intact to finger rubbing bilaterally. Uvula tongue midline. head turning and shoulder shrug were normal and symmetric.Tongue protrusion into cheek strength was normal. Motor: normal bulk and tone, full strength in the BUE, BLE,  Sensory: normal and symmetric to light touch,   Coordination: finger-nose-finger, heel-to-shin bilaterally, no dysmetria Gait and Station: Rising up from seated position without assistance, normal stance,  moderate stride, good arm swing, smooth turning, able to perform tiptoe, and heel walking without difficulty. Tandem gait is steady  DIAGNOSTIC DATA (LABS, IMAGING, TESTING) - I reviewed patient records, labs, notes, testing and imaging myself where available.  Lab Results  Component Value Date   WBC 7.2 04/19/2017   HGB 13.6 04/19/2017   HCT 38.9 04/19/2017   MCV 87 04/19/2017   PLT 205 04/19/2017      Component Value Date/Time   NA 142 04/19/2017 1134   K 4.5 04/19/2017 1134   CL 104 04/19/2017 1134   CO2 26 04/19/2017 1134   GLUCOSE 109 (H) 04/19/2017 1134   BUN 16 04/19/2017 1134   CREATININE 0.78 04/19/2017 1134   CALCIUM 8.9 04/19/2017 1134   PROT 6.0 04/13/2017 1519   ALBUMIN 3.7 04/13/2017 1519   AST 16 04/13/2017 1519   ALT 19 04/13/2017 1519   ALKPHOS 61 04/13/2017 1519   BILITOT 0.3 04/13/2017 1519   GFRNONAA 96 04/19/2017 1134   GFRAA 111 04/19/2017 1134   Lab Results  Component Value Date   CHOL 225 (H) 04/13/2017   HDL 48 04/13/2017   LDLCALC 142 (H) 04/13/2017   TRIG 176 (H) 04/13/2017   CHOLHDL 4.7 04/13/2017    Lab Results  Component Value Date   TSH 2.330 11/22/2016      ASSESSMENT AND PLAN  62 y.o. year old male  has a past medical Sleep apnea. here to follow-up for initial CPAP compliance.Compliance data dated 03/14/2017-04/12/2017 shows compliance greater than 4 hours 83%.  Average usage 5 hours 49 minutes.  Set pressure 5-10 cm.  EPR level 3 AHI 4.8.  ESS 6 which is  now within the normal  range. The patient is a current patient of Dr. Brett Fairy  who is out of the office today . This note is sent to the work in doctor.     CPAP compliance 83%, reviewed and explained results to patient Continue same settings Follow-up in 6 months for repeat compliance Dennie Bible, [facility], [facility], APRN  [facility] [address], [place], [place] [address] [phone]

## 2017-10-11 ENCOUNTER — Encounter: Payer: Self-pay | Admitting: Nurse Practitioner

## 2017-10-11 ENCOUNTER — Ambulatory Visit: Payer: Medicare HMO | Admitting: Nurse Practitioner

## 2017-10-11 DIAGNOSIS — G4733 Obstructive sleep apnea (adult) (pediatric): Secondary | ICD-10-CM | POA: Diagnosis not present

## 2017-10-12 ENCOUNTER — Encounter: Payer: Self-pay | Admitting: Family Medicine

## 2017-10-12 ENCOUNTER — Ambulatory Visit (INDEPENDENT_AMBULATORY_CARE_PROVIDER_SITE_OTHER): Payer: Medicare HMO | Admitting: Family Medicine

## 2017-10-12 ENCOUNTER — Ambulatory Visit: Payer: Medicare HMO

## 2017-10-12 VITALS — BP 137/74 | HR 82 | Temp 97.1°F | Ht 70.0 in | Wt 284.0 lb

## 2017-10-12 DIAGNOSIS — I5042 Chronic combined systolic (congestive) and diastolic (congestive) heart failure: Secondary | ICD-10-CM | POA: Diagnosis not present

## 2017-10-12 DIAGNOSIS — N529 Male erectile dysfunction, unspecified: Secondary | ICD-10-CM

## 2017-10-12 DIAGNOSIS — S83241A Other tear of medial meniscus, current injury, right knee, initial encounter: Secondary | ICD-10-CM | POA: Diagnosis not present

## 2017-10-12 DIAGNOSIS — I10 Essential (primary) hypertension: Secondary | ICD-10-CM

## 2017-10-12 DIAGNOSIS — N400 Enlarged prostate without lower urinary tract symptoms: Secondary | ICD-10-CM

## 2017-10-12 MED ORDER — HYDROCODONE-ACETAMINOPHEN 5-325 MG PO TABS: 1.0000 | ORAL_TABLET | Freq: Two times a day (BID) | ORAL | 0 refills | Status: DC | PRN

## 2017-10-12 MED ORDER — SILDENAFIL CITRATE 20 MG PO TABS: 20.0000 mg | ORAL_TABLET | Freq: Every day | ORAL | 1 refills | Status: DC | PRN

## 2017-10-12 NOTE — Progress Notes (Signed)
BP 137/74   Pulse 82   Temp (!) 97.1 F (36.2 C) (Oral)   Ht '5\' 10"'  (1.778 m)   Wt 284 lb (128.8 kg)   BMI 40.75 kg/m    Subjective:    Patient ID: Russell Werner, male    DOB: 04/04/1956, 62 y.o.   MRN: [id number]  HPI: Russell Werner is a 62 y.o. male presenting on 10/12/2017 for Hypertension (6 mo follow up) and Congestive Heart Failure   HPI Hypertension Patient is currently on bisoprolol and Entresto, and their blood pressure today is 137/74. Patient denies any lightheadedness or dizziness. Patient denies headaches, blurred vision, chest pains, shortness of breath, or weakness. Denies any side effects from medication and is content with current medication.  Patient does have CHF but his weight is actually down from where it was last year and he denies any fluid or shortness of breath.  He gets this checked up regularly with Dr. Stacy Gardner and has recently had his pacer evaluated as well.  Right meniscal tear Patient is coming in with complaints of significant pain in his right medial knee from a right meniscal tear.  He has plans to have it surgically repaired next week and is just wondering if he can get something to help with the pain in the meantime.  He does have popping and catching and giving way and has imaging and then surgery for next week.  He denies any fevers or chills or redness or warmth.  He does have some swelling in that knee and is wearing a brace currently.  BPH Patient has known BPH and is currently taking Flomax for it and denies any major issues with nighttime urination currently and feels like he has it under control.  He does have erectile dysfunction and whether that is correlated to his chronic disease and CHF or to his prostate issues it is difficult to discern but he would like to try something to help with erectile dysfunction.  From a cardiac standpoint he has been doing very well and looks like everything stable and so we will go ahead and try  sildenafil.  Relevant past medical, surgical, family and social history reviewed and updated as indicated. Interim medical history since our last visit reviewed. Allergies and medications reviewed and updated.  Review of Systems  Constitutional: Negative for chills and fever.  Eyes: Negative for discharge.  Respiratory: Negative for shortness of breath and wheezing.   Cardiovascular: Negative for chest pain, palpitations and leg swelling.  Genitourinary: Positive for frequency.  Musculoskeletal: Positive for arthralgias and joint swelling. Negative for back pain and gait problem.  Skin: Negative for rash.  Neurological: Negative for dizziness, weakness, light-headedness and numbness.  All other systems reviewed and are negative.   Per HPI unless specifically indicated above   Allergies as of 10/12/2017   No Known Allergies     Medication List        Accurate as of 10/12/17  8:03 AM. Always use your most recent med list.          bisoprolol 5 MG tablet Commonly known as:  ZEBETA Take 0.5 tablets (2.5 mg total) by mouth daily. Take 1/2 of 5 mg tablet daily   calcium-vitamin D 500-200 MG-UNIT tablet Commonly known as:  OSCAL WITH D Take 1 tablet by mouth 4 (four) times daily.   CALTRATE 600 PO Take 2 tablets by mouth 2 (two) times daily.   ENTRESTO 24-26 MG Generic drug:  sacubitril-valsartan TAKE  1 TABLET BY MOUTH TWICE A DAY   HYDROcodone-acetaminophen 5-325 MG tablet Commonly known as:  NORCO/VICODIN Take 1 tablet by mouth daily.   multivitamin capsule Take 1 capsule by mouth daily.   rosuvastatin 20 MG tablet Commonly known as:  CRESTOR Take 1 tablet (20 mg total) by mouth daily.   tamsulosin 0.4 MG Caps capsule Commonly known as:  FLOMAX Take 1 capsule (0.4 mg total) by mouth at bedtime.          Objective:    BP 137/74   Pulse 82   Temp (!) 97.1 F (36.2 C) (Oral)   Ht '5\' 10"'  (1.778 m)   Wt 284 lb (128.8 kg)   BMI 40.75 kg/m   Wt Readings  from Last 3 Encounters:  10/12/17 284 lb (128.8 kg)  10/02/17 280 lb (127 kg)  09/29/17 284 lb 6.4 oz (129 kg)    Physical Exam  Constitutional: He is oriented to person, place, and time. He appears well-developed and well-nourished. No distress.  Eyes: Conjunctivae are normal. No scleral icterus.  Neck: Neck supple. No thyromegaly present.  Cardiovascular: Normal rate, regular rhythm, normal heart sounds and intact distal pulses.  No murmur heard. Pulmonary/Chest: Effort normal and breath sounds normal. No respiratory distress. He has no wheezes. He has no rales.  Musculoskeletal: Normal range of motion. He exhibits no edema.  Patient has brace on right knee, did not have them removed for exam as its being managed by orthopedic already  Lymphadenopathy:    He has no cervical adenopathy.  Neurological: He is alert and oriented to person, place, and time. Coordination normal.  Skin: Skin is warm and dry. No rash noted. He is not diaphoretic.  Psychiatric: He has a normal mood and affect. His behavior is normal.  Nursing note and vitals reviewed.       Assessment & Plan:   Problem List Items Addressed This Visit      Cardiovascular and Mediastinum   CHF (congestive heart failure) ([place])   Relevant Medications   sildenafil (REVATIO) 20 MG tablet   Other Relevant Orders   CBC with Differential/Platelet   Lipid panel   Hypertension - Primary   Relevant Medications   sildenafil (REVATIO) 20 MG tablet   Other Relevant Orders   CBC with Differential/Platelet   CMP14+EGFR   Lipid panel     Musculoskeletal and Integument   Acute medial meniscus tear of right knee   Relevant Medications   HYDROcodone-acetaminophen (NORCO/VICODIN) 5-325 MG tablet     Genitourinary   BPH (benign prostatic hyperplasia)   Relevant Orders   PSA, total and free     Other   Morbid obesity ([place])    Other Visit Diagnoses    Erectile dysfunction, unspecified erectile dysfunction type        Relevant Medications   sildenafil (REVATIO) 20 MG tablet       Follow up plan: Return in about 6 months (around 04/13/2018), or if symptoms worsen or fail to improve, for Hypertension BPH recheck.  Counseling provided for all of the vaccine components Orders Placed This Encounter  Procedures  . CBC with Differential/Platelet  . CMP14+EGFR  . Lipid panel  . PSA, total and free    Caryl Pina, MD [facility] 10/12/2017, 8:03 AM

## 2017-10-13 ENCOUNTER — Other Ambulatory Visit: Payer: Self-pay | Admitting: Family Medicine

## 2017-10-13 LAB — CBC WITH DIFFERENTIAL/PLATELET
BASOS ABS: 0.1 10*3/uL (ref 0.0–0.2)
Basos: 1 %
EOS (ABSOLUTE): 0.2 10*3/uL (ref 0.0–0.4)
Eos: 3 %
HEMOGLOBIN: 14.3 g/dL (ref 13.0–17.7)
Hematocrit: 42.4 % (ref 37.5–51.0)
Immature Grans (Abs): 0 10*3/uL (ref 0.0–0.1)
Immature Granulocytes: 0 %
Lymphocytes Absolute: 1.9 10*3/uL (ref 0.7–3.1)
Lymphs: 23 %
MCH: 30 pg (ref 26.6–33.0)
MCHC: 33.7 g/dL (ref 31.5–35.7)
MCV: 89 fL (ref 79–97)
MONOCYTES: 7 %
MONOS ABS: 0.5 10*3/uL (ref 0.1–0.9)
NEUTROS ABS: 5.4 10*3/uL (ref 1.4–7.0)
Neutrophils: 66 %
Platelets: 198 10*3/uL (ref 150–450)
RBC: 4.76 x10E6/uL (ref 4.14–5.80)
RDW: 13.5 % (ref 12.3–15.4)
WBC: 8.1 10*3/uL (ref 3.4–10.8)

## 2017-10-13 LAB — LIPID PANEL
CHOLESTEROL TOTAL: 133 mg/dL (ref 100–199)
Chol/HDL Ratio: 2.4 ratio (ref 0.0–5.0)
HDL: 55 mg/dL (ref >39)
LDL Calculated: 62 mg/dL (ref 0–99)
TRIGLYCERIDES: 81 mg/dL (ref 0–149)
VLDL Cholesterol Cal: 16 mg/dL (ref 5–40)

## 2017-10-13 LAB — CMP14+EGFR
ALBUMIN: 4.1 g/dL (ref 3.6–4.8)
ALK PHOS: 56 IU/L (ref 39–117)
ALT: 25 IU/L (ref 0–44)
AST: 18 IU/L (ref 0–40)
Albumin/Globulin Ratio: 2 (ref 1.2–2.2)
BILIRUBIN TOTAL: 0.4 mg/dL (ref 0.0–1.2)
BUN / CREAT RATIO: 23 (ref 10–24)
BUN: 17 mg/dL (ref 8–27)
CHLORIDE: 105 mmol/L (ref 96–106)
CO2: 24 mmol/L (ref 20–29)
Calcium: 8.8 mg/dL (ref 8.6–10.2)
Creatinine, Ser: 0.75 mg/dL — ABNORMAL LOW (ref 0.76–1.27)
GFR calc Af Amer: 112 mL/min/{1.73_m2} (ref >59)
GFR calc non Af Amer: 97 mL/min/{1.73_m2} (ref >59)
GLUCOSE: 108 mg/dL — AB (ref 65–99)
Globulin, Total: 2.1 g/dL (ref 1.5–4.5)
Potassium: 4.3 mmol/L (ref 3.5–5.2)
SODIUM: 144 mmol/L (ref 134–144)
Total Protein: 6.2 g/dL (ref 6.0–8.5)

## 2017-10-13 LAB — PSA, TOTAL AND FREE
PSA FREE PCT: 33.3 %
PSA FREE: 0.7 ng/mL
Prostate Specific Ag, Serum: 2.1 ng/mL (ref 0.0–4.0)

## 2017-10-16 ENCOUNTER — Encounter: Payer: Medicare HMO | Admitting: Gastroenterology

## 2017-10-17 ENCOUNTER — Encounter (HOSPITAL_COMMUNITY): Payer: Self-pay | Admitting: *Deleted

## 2017-10-17 ENCOUNTER — Telehealth: Payer: Self-pay

## 2017-10-17 ENCOUNTER — Other Ambulatory Visit: Payer: Self-pay

## 2017-10-17 ENCOUNTER — Telehealth (INDEPENDENT_AMBULATORY_CARE_PROVIDER_SITE_OTHER): Payer: Self-pay | Admitting: *Deleted

## 2017-10-17 ENCOUNTER — Ambulatory Visit: Payer: Medicare HMO | Admitting: Gastroenterology

## 2017-10-17 MED ORDER — DEXTROSE 5 % IV SOLN
3.0000 g | INTRAVENOUS | Status: AC
  Administered 2017-10-18: 3 g via INTRAVENOUS
  Filled 2017-10-17: qty 3

## 2017-10-17 NOTE — Progress Notes (Signed)
Pt denies any acute cardiopulmonary issues. Pt under the care of Dr. Bronson Ing, Cardiology. Pt made aware to stop taking vitamins, fish oil and herbal medications. Do not take any NSAIDs ie: Ibuprofen, Advil, Naproxen(Aleve), motrin, BC and Goody Powder. Pt verbalized understanding of all pre-op instructions. Peri-op prescription for ICD initiated; awaiting response. Anesthesia asked to review pt history.

## 2017-10-17 NOTE — Progress Notes (Signed)
Anesthesia Chart Review:   Case:  [id number] Date/Time:  10/18/17 0815   Procedure:  RIGHT KNEE ARTHROSCOPY WITH PARTIAL MEDIAL MENISCECTOMY (Right )   Anesthesia type:  General   Pre-op diagnosis:  right knee medial meniscal tear   Location:  [facility] OR ROOM 03 / [place] [facility]   Surgeon:  Leandrew Koyanagi, MD      DISCUSSION: - Pt is a 62 year old male with hx chronic systolic HF, cardiomyopathy, EF 25-30%, AICD ([employer], implanted 05/03/17), nonobstructive CAD    PROVIDERS: PCP is [place], Fransisca Kaufmann, MD Cardiologist is Kate Sable, MD. Pt cleared for surgery at last office visit 09/29/17 EP cardiologist is Thompson Grayer, MD. Last office visit 07/19/17   LABS: labs from PCP's office 10/12/17 reviewed.  - CMP and CBC acceptable for surgery   IMAGES:  CXR 05/04/17: No postprocedure complication following permanent pacemaker placement.   EKG 07/19/17: NSR. LAD. Nonspecific IV block. Nonspecific T wave abnormality   CV:  Echo 04/12/17:  - Left ventricle: The cavity size was moderately dilated. Wall thickness was increased in a pattern of mild LVH. Systolic function was severely reduced. The estimated ejection fraction was in the range of 25% to 30%. Diffuse hypokinesis. There is akinesis of the basal-midanteroseptal, inferolateral, and inferior myocardium. The study is not technically sufficient to allow evaluation of LV diastolic function. - Aortic valve: Mildly calcified annulus. Trileaflet. There was trivial regurgitation. - Aortic root: The aortic root was mildly ectatic. - Mitral valve: Mildly calcified annulus. There was mild regurgitation. - Left atrium: The atrium was mildly dilated. - Right atrium: Central venous pressure (est): 8 mm Hg. - Tricuspid valve: There was trivial regurgitation. - Pericardium, extracardiac: There was no pericardial effusion. - Impressions: Mild LVH with moderate LV chamber dilatation and LVEF 25-30%. There is diffuse hypokinesis with relative akinesis of the  mid to basal inferior. inferoseptal, and anteroseptal walls. Indeterminate diastolic function. Mild left atrial enlargement. Mild mitral regurgitation. Mildly ectatic aortic root. Trivial aortic regurgitation. Trivial tricuspid regurgitation with estimated PASP 29 mmHg.  Nuclear stress test 07/13/14 (in media tab 03/01/2017): 1.  Nuclear stress test was negative for evidence of inducible ischemia. 2.  Findings consistent with dilated cardiomyopathy. 3.  Fixed inferior wall defect consistent with diaphragmatic attenuation artifact.  Cardiac cath 11/15/07 (care everywhere):  -Severe nonischemic cardia myopathy. - Nonobstructive CAD (LM, LAD, CX normal; mid RCA with long 2cm plaque).   Past Medical History:  Diagnosis Date  . 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  . Arthritis    "fingers" (05/03/2017)  . CHF (congestive heart failure) ([place])   . COPD (chronic obstructive pulmonary disease) ([place])   . Coronary artery disease    nonobstructive mid RCA by 2009 cath at [facility] in [place]  . Enlarged prostate   . High cholesterol   . Hypertension   . Melanoma of back ([place]) 2013  . Meniscal injury   . Myocardial infarction ([place]) 1998   mild  . OSA on CPAP    "severe" (05/03/2017)  . Right knee meniscal tear     Past Surgical History:  Procedure Laterality Date  . CARDIAC CATHETERIZATION  X 2  . CARDIAC DEFIBRILLATOR PLACEMENT  05/03/2017  . CYST REMOVAL LEG Bilateral 2013  . HERNIA REPAIR    . ICD IMPLANT N/A 05/03/2017   Procedure: ICD IMPLANT;  Surgeon: Thompson Grayer, MD;  Location: [facility];  Service: Cardiovascular;  Laterality: N/A;  . KNEE CARTILAGE SURGERY Left 1972  .  LAPAROSCOPIC CHOLECYSTECTOMY  2015  . LAPAROSCOPIC GASTRIC BAND REMOVAL WITH LAPAROSCOPIC GASTRIC SLEEVE RESECTION  11/05/2014  . MELANOMA EXCISION  2013   "back"  . UMBILICAL HERNIA REPAIR  2014    MEDICATIONS: No current facility-administered medications for  this encounter.    . bisoprolol (ZEBETA) 5 MG tablet  . calcium-vitamin D (OSCAL WITH D) 500-200 MG-UNIT tablet  . ENTRESTO 24-26 MG  . HYDROcodone-acetaminophen (NORCO/VICODIN) 5-325 MG tablet  . Multiple Vitamin (MULTIVITAMIN) capsule  . rosuvastatin (CRESTOR) 20 MG tablet  . sildenafil (REVATIO) 20 MG tablet  . tamsulosin (FLOMAX) 0.4 MG CAPS capsule    If no changes, I anticipate pt can proceed with surgery as scheduled.   Willeen Cass, FNP-BC [facility]/Anesthesiology Phone: [phone] 10/17/2017 11:17 AM

## 2017-10-17 NOTE — Telephone Encounter (Signed)
Received fax from [facility] stating appeal has been approved for CT can 73701 for Knee with authoriation # [id number] from June 14-09/14/19 for [facility] imaging. Appeal case # [id number]

## 2017-10-17 NOTE — Telephone Encounter (Signed)
Insurance denied prior auth for Sildenafil  Medicare does not cover this

## 2017-10-18 ENCOUNTER — Encounter (HOSPITAL_COMMUNITY)
Admission: RE | Disposition: A | Discharge status: Home / Self Care | Payer: Self-pay | Source: Ambulatory Visit | Attending: Orthopaedic Surgery

## 2017-10-18 ENCOUNTER — Ambulatory Visit (HOSPITAL_COMMUNITY): Payer: Medicare HMO | Admitting: Emergency Medicine

## 2017-10-18 ENCOUNTER — Ambulatory Visit (HOSPITAL_COMMUNITY)
Admission: RE | Admit: 2017-10-18 | Discharge: 2017-10-18 | Disposition: A | Discharge status: Home / Self Care | Payer: Medicare HMO | Source: Ambulatory Visit | Attending: Orthopaedic Surgery | Admitting: Orthopaedic Surgery

## 2017-10-18 ENCOUNTER — Ambulatory Visit (INDEPENDENT_AMBULATORY_CARE_PROVIDER_SITE_OTHER): Payer: Medicare HMO | Admitting: *Deleted

## 2017-10-18 ENCOUNTER — Encounter (HOSPITAL_COMMUNITY): Payer: Self-pay | Admitting: Surgery

## 2017-10-18 DIAGNOSIS — M659 Synovitis and tenosynovitis, unspecified: Secondary | ICD-10-CM | POA: Diagnosis not present

## 2017-10-18 DIAGNOSIS — M94261 Chondromalacia, right knee: Secondary | ICD-10-CM | POA: Diagnosis not present

## 2017-10-18 DIAGNOSIS — I509 Heart failure, unspecified: Secondary | ICD-10-CM | POA: Diagnosis not present

## 2017-10-18 DIAGNOSIS — J449 Chronic obstructive pulmonary disease, unspecified: Secondary | ICD-10-CM | POA: Diagnosis not present

## 2017-10-18 DIAGNOSIS — M65161 Other infective (teno)synovitis, right knee: Secondary | ICD-10-CM | POA: Diagnosis not present

## 2017-10-18 DIAGNOSIS — Z79891 Long term (current) use of opiate analgesic: Secondary | ICD-10-CM | POA: Insufficient documentation

## 2017-10-18 DIAGNOSIS — X58XXXA Exposure to other specified factors, initial encounter: Secondary | ICD-10-CM | POA: Insufficient documentation

## 2017-10-18 DIAGNOSIS — I2510 Atherosclerotic heart disease of native coronary artery without angina pectoris: Secondary | ICD-10-CM | POA: Insufficient documentation

## 2017-10-18 DIAGNOSIS — I252 Old myocardial infarction: Secondary | ICD-10-CM | POA: Insufficient documentation

## 2017-10-18 DIAGNOSIS — E7800 Pure hypercholesterolemia, unspecified: Secondary | ICD-10-CM | POA: Diagnosis not present

## 2017-10-18 DIAGNOSIS — I10 Essential (primary) hypertension: Secondary | ICD-10-CM | POA: Diagnosis not present

## 2017-10-18 DIAGNOSIS — N400 Enlarged prostate without lower urinary tract symptoms: Secondary | ICD-10-CM | POA: Diagnosis not present

## 2017-10-18 DIAGNOSIS — Z87891 Personal history of nicotine dependence: Secondary | ICD-10-CM | POA: Insufficient documentation

## 2017-10-18 DIAGNOSIS — S83231A Complex tear of medial meniscus, current injury, right knee, initial encounter: Secondary | ICD-10-CM | POA: Insufficient documentation

## 2017-10-18 DIAGNOSIS — I5022 Chronic systolic (congestive) heart failure: Secondary | ICD-10-CM | POA: Diagnosis not present

## 2017-10-18 DIAGNOSIS — I428 Other cardiomyopathies: Secondary | ICD-10-CM

## 2017-10-18 DIAGNOSIS — M23303 Other meniscus derangements, unspecified medial meniscus, right knee: Secondary | ICD-10-CM | POA: Diagnosis not present

## 2017-10-18 DIAGNOSIS — Z79899 Other long term (current) drug therapy: Secondary | ICD-10-CM | POA: Diagnosis not present

## 2017-10-18 DIAGNOSIS — Z95810 Presence of automatic (implantable) cardiac defibrillator: Secondary | ICD-10-CM | POA: Diagnosis not present

## 2017-10-18 DIAGNOSIS — G4733 Obstructive sleep apnea (adult) (pediatric): Secondary | ICD-10-CM | POA: Diagnosis not present

## 2017-10-18 DIAGNOSIS — S83241A Other tear of medial meniscus, current injury, right knee, initial encounter: Secondary | ICD-10-CM | POA: Diagnosis not present

## 2017-10-18 DIAGNOSIS — Z85820 Personal history of malignant melanoma of skin: Secondary | ICD-10-CM | POA: Diagnosis not present

## 2017-10-18 DIAGNOSIS — I110 Hypertensive heart disease with heart failure: Secondary | ICD-10-CM | POA: Diagnosis not present

## 2017-10-18 HISTORY — PX: KNEE ARTHROSCOPY WITH MEDIAL MENISECTOMY: SHX5651

## 2017-10-18 HISTORY — DX: Atherosclerotic heart disease of native coronary artery without angina pectoris: I25.10

## 2017-10-18 HISTORY — DX: Unspecified tear of unspecified meniscus, current injury, right knee, initial encounter: S83.206A

## 2017-10-18 SURGERY — ARTHROSCOPY, KNEE, WITH MEDIAL MENISCECTOMY
Anesthesia: General | Site: Knee | Laterality: Right

## 2017-10-18 MED ORDER — LACTATED RINGERS IV SOLN: INTRAVENOUS | Status: DC

## 2017-10-18 MED ORDER — ONDANSETRON HCL 4 MG PO TABS: 4.0000 mg | ORAL_TABLET | Freq: Three times a day (TID) | ORAL | 0 refills | Status: DC | PRN

## 2017-10-18 MED ORDER — ONDANSETRON HCL 4 MG/2ML IJ SOLN
INTRAMUSCULAR | Status: DC | PRN
  Administered 2017-10-18: 4 mg via INTRAVENOUS

## 2017-10-18 MED ORDER — SODIUM CHLORIDE 0.9 % IR SOLN
Status: DC | PRN
  Administered 2017-10-18: 3000 mL

## 2017-10-18 MED ORDER — FENTANYL CITRATE (PF) 100 MCG/2ML IJ SOLN
INTRAMUSCULAR | Status: AC
  Filled 2017-10-18: qty 2

## 2017-10-18 MED ORDER — PROPOFOL 10 MG/ML IV BOLUS
INTRAVENOUS | Status: AC
  Filled 2017-10-18: qty 20

## 2017-10-18 MED ORDER — CHLORHEXIDINE GLUCONATE 4 % EX LIQD: 60.0000 mL | Freq: Once | CUTANEOUS | Status: DC

## 2017-10-18 MED ORDER — METOCLOPRAMIDE HCL 5 MG/ML IJ SOLN: 10.0000 mg | Freq: Once | INTRAMUSCULAR | Status: DC | PRN

## 2017-10-18 MED ORDER — FENTANYL CITRATE (PF) 100 MCG/2ML IJ SOLN
25.0000 ug | INTRAMUSCULAR | Status: DC | PRN
  Administered 2017-10-18 (×2): 50 ug via INTRAVENOUS

## 2017-10-18 MED ORDER — HYDROCODONE-ACETAMINOPHEN 5-325 MG PO TABS
ORAL_TABLET | ORAL | Status: AC
  Filled 2017-10-18: qty 2

## 2017-10-18 MED ORDER — HYDROCODONE-ACETAMINOPHEN 5-325 MG PO TABS
2.0000 | ORAL_TABLET | Freq: Four times a day (QID) | ORAL | Status: DC | PRN
  Administered 2017-10-18: 2 via ORAL

## 2017-10-18 MED ORDER — SENNOSIDES-DOCUSATE SODIUM 8.6-50 MG PO TABS: 1.0000 | ORAL_TABLET | Freq: Every evening | ORAL | 1 refills | Status: DC | PRN

## 2017-10-18 MED ORDER — FENTANYL CITRATE (PF) 250 MCG/5ML IJ SOLN
INTRAMUSCULAR | Status: DC | PRN
  Administered 2017-10-18 (×2): 25 ug via INTRAVENOUS
  Administered 2017-10-18: 50 ug via INTRAVENOUS

## 2017-10-18 MED ORDER — LIDOCAINE 2% (20 MG/ML) 5 ML SYRINGE
INTRAMUSCULAR | Status: DC | PRN
  Administered 2017-10-18: 100 mg via INTRAVENOUS

## 2017-10-18 MED ORDER — FENTANYL CITRATE (PF) 250 MCG/5ML IJ SOLN
INTRAMUSCULAR | Status: AC
  Filled 2017-10-18: qty 5

## 2017-10-18 MED ORDER — LACTATED RINGERS IV SOLN
INTRAVENOUS | Status: DC
  Administered 2017-10-18: 08:00:00 via INTRAVENOUS

## 2017-10-18 MED ORDER — BUPIVACAINE HCL (PF) 0.25 % IJ SOLN
INTRAMUSCULAR | Status: DC | PRN
  Administered 2017-10-18: 20 mL

## 2017-10-18 MED ORDER — BUPIVACAINE HCL (PF) 0.25 % IJ SOLN
INTRAMUSCULAR | Status: AC
  Filled 2017-10-18: qty 30

## 2017-10-18 MED ORDER — GLYCOPYRROLATE PF 0.2 MG/ML IJ SOSY
PREFILLED_SYRINGE | INTRAMUSCULAR | Status: DC | PRN
  Administered 2017-10-18: .1 mg via INTRAVENOUS

## 2017-10-18 MED ORDER — LIDOCAINE 2% (20 MG/ML) 5 ML SYRINGE
INTRAMUSCULAR | Status: AC
  Filled 2017-10-18: qty 5

## 2017-10-18 MED ORDER — MIDAZOLAM HCL 5 MG/5ML IJ SOLN
INTRAMUSCULAR | Status: DC | PRN
  Administered 2017-10-18: 2 mg via INTRAVENOUS

## 2017-10-18 MED ORDER — PROPOFOL 10 MG/ML IV BOLUS
INTRAVENOUS | Status: DC | PRN
  Administered 2017-10-18: 40 mg via INTRAVENOUS
  Administered 2017-10-18: 100 mg via INTRAVENOUS

## 2017-10-18 MED ORDER — DEXAMETHASONE SODIUM PHOSPHATE 10 MG/ML IJ SOLN
INTRAMUSCULAR | Status: DC | PRN
  Administered 2017-10-18: 10 mg via INTRAVENOUS

## 2017-10-18 MED ORDER — MIDAZOLAM HCL 2 MG/2ML IJ SOLN
INTRAMUSCULAR | Status: AC
Start: 2017-10-18 — End: ?
  Filled 2017-10-18: qty 2

## 2017-10-18 MED ORDER — PHENYLEPHRINE HCL 10 MG/ML IJ SOLN
INTRAMUSCULAR | Status: DC | PRN
  Administered 2017-10-18: 20 ug/min via INTRAVENOUS

## 2017-10-18 MED ORDER — GLYCOPYRROLATE PF 0.2 MG/ML IJ SOSY
PREFILLED_SYRINGE | INTRAMUSCULAR | Status: AC
Start: 2017-10-18 — End: ?
  Filled 2017-10-18: qty 1

## 2017-10-18 MED ORDER — HYDROCODONE-ACETAMINOPHEN 5-325 MG PO TABS: 1.0000 | ORAL_TABLET | Freq: Four times a day (QID) | ORAL | 0 refills | Status: DC | PRN

## 2017-10-18 MED ORDER — MEPERIDINE HCL 50 MG/ML IJ SOLN: 6.2500 mg | INTRAMUSCULAR | Status: DC | PRN

## 2017-10-18 SURGICAL SUPPLY — 44 items
BANDAGE ACE 6X5 VEL STRL LF (GAUZE/BANDAGES/DRESSINGS) ×2 IMPLANT
BANDAGE ELASTIC 6 VELCRO ST LF (GAUZE/BANDAGES/DRESSINGS) ×4 IMPLANT
BANDAGE ESMARK 6X9 LF (GAUZE/BANDAGES/DRESSINGS) ×1 IMPLANT
BLADE CLIPPER SURG (BLADE) IMPLANT
BLADE CUDA 5.5 (BLADE) IMPLANT
BLADE CUTTER GATOR 3.5 (BLADE) ×2 IMPLANT
BLADE GREAT WHITE 4.2 (BLADE) IMPLANT
BLADE SURG 11 STRL SS (BLADE) IMPLANT
BNDG ESMARK 6X9 LF (GAUZE/BANDAGES/DRESSINGS) ×2
BUR OVAL 6.0 (BURR) IMPLANT
COVER SURGICAL LIGHT HANDLE (MISCELLANEOUS) ×2 IMPLANT
CUFF TOURNIQUET SINGLE 34IN LL (TOURNIQUET CUFF) IMPLANT
CUFF TOURNIQUET SINGLE 44IN (TOURNIQUET CUFF) IMPLANT
DRAPE ARTHROSCOPY W/POUCH 114 (DRAPES) ×2 IMPLANT
DRAPE SURG 17X23 STRL (DRAPES) ×4 IMPLANT
DRAPE U-SHAPE 47X51 STRL (DRAPES) ×2 IMPLANT
DRSG PAD ABDOMINAL 8X10 ST (GAUZE/BANDAGES/DRESSINGS) IMPLANT
DURAPREP 26ML APPLICATOR (WOUND CARE) ×2 IMPLANT
ELECT CAUTERY BLADE 6.4 (BLADE) IMPLANT
FACESHIELD WRAPAROUND (MASK) ×2 IMPLANT
GAUZE SPONGE 4X4 12PLY STRL (GAUZE/BANDAGES/DRESSINGS) ×2 IMPLANT
GAUZE XEROFORM 1X8 LF (GAUZE/BANDAGES/DRESSINGS) ×2 IMPLANT
GLOVE BIOGEL PI IND STRL 7.0 (GLOVE) ×1 IMPLANT
GLOVE BIOGEL PI INDICATOR 7.0 (GLOVE) ×1
GLOVE ECLIPSE 7.0 STRL STRAW (GLOVE) ×2 IMPLANT
GLOVE SKINSENSE NS SZ7.5 (GLOVE) ×2
GLOVE SKINSENSE STRL SZ7.5 (GLOVE) ×2 IMPLANT
GOWN STRL REIN XL XLG (GOWN DISPOSABLE) ×2 IMPLANT
KIT TURNOVER KIT B (KITS) ×2 IMPLANT
MANIFOLD NEPTUNE II (INSTRUMENTS) IMPLANT
NS IRRIG 1000ML POUR BTL (IV SOLUTION) IMPLANT
PACK ARTHROSCOPY DSU (CUSTOM PROCEDURE TRAY) ×2 IMPLANT
PAD ABD 8X10 STRL (GAUZE/BANDAGES/DRESSINGS) ×2 IMPLANT
PAD ARMBOARD 7.5X6 YLW CONV (MISCELLANEOUS) ×4 IMPLANT
PADDING CAST COTTON 6X4 STRL (CAST SUPPLIES) ×2 IMPLANT
SET ARTHROSCOPY TUBING (MISCELLANEOUS) ×1
SET ARTHROSCOPY TUBING LN (MISCELLANEOUS) ×1 IMPLANT
SHAVER 4.2 MM LANZA 9391A (BLADE) ×2 IMPLANT
SPONGE LAP 4X18 RFD (DISPOSABLE) ×2 IMPLANT
SUT ETHILON 3 0 PS 1 (SUTURE) ×2 IMPLANT
TOWEL OR 17X24 6PK STRL BLUE (TOWEL DISPOSABLE) ×2 IMPLANT
TOWEL OR 17X26 10 PK STRL BLUE (TOWEL DISPOSABLE) ×2 IMPLANT
WAND HAND CNTRL MULTIVAC 90 (MISCELLANEOUS) IMPLANT
WATER STERILE IRR 1000ML POUR (IV SOLUTION) ×2 IMPLANT

## 2017-10-18 NOTE — Anesthesia Preprocedure Evaluation (Signed)
Anesthesia Evaluation  Patient identified by MRN, date of birth, ID band Patient awake    Reviewed: Allergy & Precautions, NPO status , Patient's Chart, lab work & pertinent test results  Airway Mallampati: II  TM Distance: >3 FB Neck ROM: Full    Dental no notable dental hx.    Pulmonary sleep apnea , COPD, former smoker,    Pulmonary exam normal breath sounds clear to auscultation       Cardiovascular hypertension, Pt. on medications +CHF (EF 25%)  Normal cardiovascular exam+ Cardiac Defibrillator  Rhythm:Regular Rate:Normal     Neuro/Psych negative neurological ROS  negative psych ROS   GI/Hepatic negative GI ROS, Neg liver ROS,   Endo/Other  negative endocrine ROS  Renal/GU negative Renal ROS  negative genitourinary   Musculoskeletal negative musculoskeletal ROS (+)   Abdominal   Peds negative pediatric ROS (+)  Hematology negative hematology ROS (+)   Anesthesia Other Findings   Reproductive/Obstetrics negative OB ROS                             Anesthesia Physical Anesthesia Plan  ASA: III  Anesthesia Plan: General   Post-op Pain Management:    Induction: Intravenous  PONV Risk Score and Plan: 2 and Ondansetron and Treatment may vary due to age or medical condition  Airway Management Planned: LMA  Additional Equipment:   Intra-op Plan:   Post-operative Plan: Extubation in OR  Informed Consent: I have reviewed the patients History and Physical, chart, labs and discussed the procedure including the risks, benefits and alternatives for the proposed anesthesia with the patient or authorized representative who has indicated his/her understanding and acceptance.   Dental advisory given  Plan Discussed with: CRNA  Anesthesia Plan Comments:         Anesthesia Quick Evaluation

## 2017-10-18 NOTE — Anesthesia Postprocedure Evaluation (Signed)
Anesthesia Post Note  Patient: Russell Werner  Procedure(s) Performed: RIGHT KNEE ARTHROSCOPY WITH PARTIAL MEDIAL MENISCECTOMY (Right Knee)     Patient location during evaluation: PACU Anesthesia Type: General Level of consciousness: awake and alert Pain management: pain level controlled Vital Signs Assessment: post-procedure vital signs reviewed and stable Respiratory status: spontaneous breathing, nonlabored ventilation, respiratory function stable and patient connected to nasal cannula oxygen Cardiovascular status: blood pressure returned to baseline and stable Postop Assessment: no apparent nausea or vomiting Anesthetic complications: no    Last Vitals:  Vitals:   10/18/17 1010 10/18/17 1017  BP:  (!) 118/58  Pulse: 62 63  Resp: 19 18  Temp: 36.5 C   SpO2: 98% 99%    Last Pain:  Vitals:   10/18/17 1010  TempSrc:   PainSc: 5                  Montez Hageman

## 2017-10-18 NOTE — Telephone Encounter (Signed)
Tell patient to look it up on good [email or web address] and usually he can get it for cheap with a coupon printed from there

## 2017-10-18 NOTE — Progress Notes (Signed)
Remote ICD transmission.   

## 2017-10-18 NOTE — H&P (Signed)
PREOPERATIVE H&P  Chief Complaint: right knee medial meniscal tear  HPI: Russell Werner is a 62 y.o. male who presents for surgical treatment of right knee medial meniscal tear.  He denies any changes in medical history.  Past Medical History:  Diagnosis Date  . 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  . Arthritis    "fingers" (05/03/2017)  . CHF (congestive heart failure) ([place])   . COPD (chronic obstructive pulmonary disease) ([place])   . Coronary artery disease    nonobstructive mid RCA by 2009 cath at [facility] in [place]  . Enlarged prostate   . High cholesterol   . Hypertension   . Melanoma of back ([place]) 2013  . Meniscal injury   . Myocardial infarction ([place]) 1998   mild  . OSA on CPAP    "severe" (05/03/2017)  . Right knee meniscal tear    Past Surgical History:  Procedure Laterality Date  . CARDIAC CATHETERIZATION  X 2  . CARDIAC DEFIBRILLATOR PLACEMENT  05/03/2017  . CYST REMOVAL LEG Bilateral 2013  . HERNIA REPAIR    . ICD IMPLANT N/A 05/03/2017   Procedure: ICD IMPLANT;  Surgeon: Thompson Grayer, MD;  Location: [facility];  Service: Cardiovascular;  Laterality: N/A;  . KNEE CARTILAGE SURGERY Left 1972  . LAPAROSCOPIC CHOLECYSTECTOMY  2015  . LAPAROSCOPIC GASTRIC BAND REMOVAL WITH LAPAROSCOPIC GASTRIC SLEEVE RESECTION  11/05/2014  . MELANOMA EXCISION  2013   "back"  . UMBILICAL HERNIA REPAIR  2014   Social History   Socioeconomic History  . Marital status: Divorced    Spouse name: Not on file  . Number of children: Not on file  . Years of education: Not on file  . Highest education level: Not on file  Occupational History  . Not on file  Social Needs  . Financial resource strain: Not on file  . Food insecurity:    Worry: Not on file    Inability: Not on file  . Transportation needs:    Medical: Not on file    Non-medical: Not on file  Tobacco Use  . Smoking status: Former Smoker    Packs/day: 1.00   Years: 47.00    Pack years: 47.00    Types: Cigarettes    Start date: 06/24/1967    Last attempt to quit: 05/02/2013    Years since quitting: 4.4  . Smokeless tobacco: Never Used  Substance and Sexual Activity  . Alcohol use: Yes    Comment:  "1-2 glasses of wine/month"  . Drug use: No  . Sexual activity: Yes  Lifestyle  . Physical activity:    Days per week: Not on file    Minutes per session: Not on file  . Stress: Not on file  Relationships  . Social connections:    Talks on phone: Not on file    Gets together: Not on file    Attends religious service: Not on file    Active member of club or organization: Not on file    Attends meetings of clubs or organizations: Not on file    Relationship status: Not on file  Other Topics Concern  . Not on file  Social History Narrative  . Not on file   Family History  Problem Relation Age of Onset  . Early death Mother   . Cancer Sister        breast  . HIV Brother   . Cancer Maternal Grandmother  breast  . Stroke Maternal Grandfather   . Cancer Sister        melanoma   No Known Allergies Prior to Admission medications   Medication Sig Start Date End Date Taking? Authorizing Provider  bisoprolol (ZEBETA) 5 MG tablet Take 0.5 tablets (2.5 mg total) by mouth daily. Take 1/2 of 5 mg tablet daily 05/19/17  Yes Herminio Commons, MD  calcium-vitamin D (OSCAL WITH D) 500-200 MG-UNIT tablet Take 2 tablets by mouth 2 (two) times daily.    Yes [provider]  ENTRESTO 24-26 MG TAKE 1 TABLET BY MOUTH TWICE A DAY 09/15/17  Yes Herminio Commons, MD  HYDROcodone-acetaminophen (NORCO/VICODIN) 5-325 MG tablet Take 1 tablet by mouth 2 (two) times daily as needed for moderate pain. 10/12/17  Yes Dettinger, Fransisca Kaufmann, MD  Multiple Vitamin (MULTIVITAMIN) capsule Take 1 capsule by mouth 2 (two) times daily.    Yes [provider]  rosuvastatin (CRESTOR) 20 MG tablet TAKE 1 TABLET BY MOUTH EVERY DAY 10/13/17  Yes Dettinger,  Fransisca Kaufmann, MD  tamsulosin (FLOMAX) 0.4 MG CAPS capsule Take 1 capsule (0.4 mg total) by mouth at bedtime. 08/16/17  Yes Dettinger, Fransisca Kaufmann, MD  sildenafil (REVATIO) 20 MG tablet Take 1-3 tablets (20-60 mg total) by mouth daily as needed. 10/12/17   Dettinger, Fransisca Kaufmann, MD     Positive ROS: All other systems have been reviewed and were otherwise negative with the exception of those mentioned in the HPI and as above.  Physical Exam: General: Alert, no acute distress Cardiovascular: No pedal edema Respiratory: No cyanosis, no use of accessory musculature GI: abdomen soft Skin: No lesions in the area of chief complaint Neurologic: Sensation intact distally Psychiatric: Patient is competent for consent with normal mood and affect Lymphatic: no lymphedema  MUSCULOSKELETAL: exam stable  Assessment: right knee medial meniscal tear  Plan: Plan for Procedure(s): RIGHT KNEE ARTHROSCOPY WITH PARTIAL MEDIAL MENISCECTOMY  The risks benefits and alternatives were discussed with the patient including but not limited to the risks of nonoperative treatment, versus surgical intervention including infection, bleeding, nerve injury,  blood clots, cardiopulmonary complications, morbidity, mortality, among others, and they were willing to proceed.   Eduard Roux, MD   10/18/2017 8:07 AM

## 2017-10-18 NOTE — Discharge Instructions (Signed)
° ° °Post-operative patient instructions  °Knee Arthroscopy  ° °• Ice:  Place intermittent ice or cooler pack over your knee, 30 minutes on and 30 minutes off.  Continue this for the first 72 hours after surgery, then save ice for use after therapy sessions or on more active days.   °• Weight:  You may bear weight on your leg as your symptoms allow. °• Crutches:  Use crutches (or walker) to assist in walking until told to discontinue by your physical therapist or physician. This will help to reduce pain. °• Strengthening:  Perform simple thigh squeezes (isometric quad contractions) and straight leg lifts as you are able (3 sets of 5 to 10 repetitions, 3 times a day).  For the leg lifts, have someone support under your ankle in the beginning until you have increased strength enough to do this on your own.  To help get started on thigh squeezes, place a pillow under your knee and push down on the pillow with back of knee (sometimes easier to do than with your leg fully straight). °• Motion:  Perform gentle knee motion as tolerated - this is gentle bending and straightening of the knee. Seated heel slides: you can start by sitting in a chair, remove your brace, and gently slide your heel back on the floor - allowing your knee to bend. Have someone help you straighten your knee (or use your other leg/foot hooked under your ankle.  °• Dressing:  Perform 1st dressing change at 2 days postoperative. A moderate amount of blood tinged drainage is to be expected.  So if you bleed through the dressing on the first or second day or if you have fevers, it is fine to change the dressing/check the wounds early and redress wound. Elevate your leg.  If it bleeds through again, or if the incisions are leaking frank blood, please call the office. May change dressing every 1-2 days thereafter to help watch wounds. Can purchase Tegaderm (or 3M Nexcare) water resistant dressings at local pharmacy / [facility]. °• Shower:  Light shower is  ok after 2 days.  Please take shower, NO bath. Recover with gauze and ace wrap to help keep wounds protected.   °• Pain medication:  A narcotic pain medication has been prescribed.  Take as directed.  Typically you need narcotic pain medication more regularly during the first 3 to 5 days after surgery.  Decrease your use of the medication as the pain improves.  Narcotics can sometimes cause constipation, even after a few doses.  If you have problems with constipation, you can take an over the counter stool softener or light laxative.  If you have persistent problems, please notify your physician’s office. °• Physical therapy: Additional activity guidelines to be provided by your physician or physical therapist at follow-up visits.  °• Driving: Do not recommend driving x 2 weeks post surgical, especially if surgery performed on right side. Should not drive while taking narcotic pain medications. It typically takes at least 2 weeks to restore sufficient neuromuscular function for normal reaction times for driving safety.  °• Call [phone] for questions or problems. Evenings you will be forwarded to the hospital operator.  Ask for the orthopaedic physician on call. Please call if you experience:  °  °o Redness, foul smelling, or persistent drainage from the surgical site  °o worsening knee pain and swelling not responsive to medication  °o any calf pain and or swelling of the lower leg  °o temperatures greater than   101.5 F o other questions or concerns   Thank you for allowing us to be a part of your care.  

## 2017-10-18 NOTE — Transfer of Care (Signed)
Immediate Anesthesia Transfer of Care Note  Patient: Russell Werner  Procedure(s) Performed: RIGHT KNEE ARTHROSCOPY WITH PARTIAL MEDIAL MENISCECTOMY (Right Knee)  Patient Location: PACU  Anesthesia Type:General  Level of Consciousness: awake, alert  and patient cooperative  Airway & Oxygen Therapy: Patient Spontanous Breathing and Patient connected to face mask oxygen  Post-op Assessment: Report given to RN, Post -op Vital signs reviewed and stable and Patient moving all extremities X 4  Post vital signs: Reviewed and stable  Last Vitals:  Vitals Value Taken Time  BP 106/47 10/18/2017  9:29 AM  Temp    Pulse 55 10/18/2017  9:30 AM  Resp 12 10/18/2017  9:30 AM  SpO2 100 % 10/18/2017  9:30 AM  Vitals shown include unvalidated device data.  Last Pain:  Vitals:   10/18/17 0644  TempSrc:   PainSc: 0-No pain      Patients Stated Pain Goal: 3 (02/33/43 5686)  Complications: No apparent anesthesia complications

## 2017-10-18 NOTE — Anesthesia Procedure Notes (Signed)
Procedure Name: LMA Insertion Date/Time: 10/18/2017 8:42 AM Performed by: Freddie Breech, CRNA Pre-anesthesia Checklist: Patient identified, Emergency Drugs available, Suction available and Patient being monitored Patient Re-evaluated:Patient Re-evaluated prior to induction Oxygen Delivery Method: Circle System Utilized Preoxygenation: Pre-oxygenation with 100% oxygen Induction Type: IV induction Ventilation: Mask ventilation without difficulty LMA: LMA inserted and LMA with gastric port inserted LMA Size: 5.0 Number of attempts: 1 Airway Equipment and Method: Bite block Placement Confirmation: positive ETCO2 Tube secured with: Tape Dental Injury: Teeth and Oropharynx as per pre-operative assessment

## 2017-10-18 NOTE — Telephone Encounter (Signed)
Left details on voicemail

## 2017-10-18 NOTE — Op Note (Signed)
   Surgery Date: 10/18/2017  Surgeon(s): Leandrew Koyanagi, MD  ASSIST: Madalyn Rob, PA-C; necessary for the timely completion of procedure and due to complexity of procedure.  ANESTHESIA:  general  FLUIDS: Per anesthesia record.   ESTIMATED BLOOD LOSS: minimal  PREOPERATIVE DIAGNOSES:  1. Right knee medial meniscus tear 2. Right knee synovitis 3. Right knee chondromalacia  POSTOPERATIVE DIAGNOSES:  same  PROCEDURES PERFORMED:  1. Right knee arthroscopy with major synovectomy in all 3 compartments 2. Right knee arthroscopy with arthroscopic partial medial meniscectomy 3. Right knee arthroscopy with arthroscopic chondroplasty medial femoral condyle and medial tibial plateau. Femoral trochlea, patella.  DESCRIPTION OF PROCEDURE: Russell Werner is a 62 y.o.-year-old male with right knee medial meniscus tear. Plans are to proceed with partial medial meniscectomy and diagnostic arthroscopy with debridement as indicated. Full discussion held regarding risks benefits alternatives and complications related surgical intervention. Conservative care options reviewed. All questions answered.  The patient was identified in the preoperative holding area and the operative extremity was marked. The patient was brought to the operating room and transferred to operating table in a supine position. Satisfactory general anesthesia was induced by anesthesiology.    Standard anterolateral, anteromedial arthroscopy portals were obtained. The anteromedial portal was obtained with a spinal needle for localization under direct visualization with subsequent diagnostic findings.   We performed a major synovectomy in all 3 compartments of the knee with an oscillating shaver.  After this was done we then addressed the medial compartment first.  There was widespread grade IV chondromalacia with unstable cartilage of the medial femoral condyle and medial tibial plateau.  Chondroplasty was performed using a  full-radius shaver back to stable border.  I then used a probe to assess the medial meniscus which did show a complex tear of the mid body into the posterior horn.  The root of the medial meniscus was intact.  Partial medial meniscectomy was performed back to stable border.  We then repositioned the arthroscope into the lateral compartment which was unremarkable.  We then extended the knee and repositioned the arthroscope in the patellofemoral compartment.  There was widespread grade IV chondromalacia of the femoral trochlea and grade II chondromalacia of the patella.  Suprapatellar pouch and gutters: moderate synovitis or debris. Patella chondral surface: Grade 2 Trochlear chondral surface: Grade 4 Patellofemoral tracking: normal Medial meniscus: complex tear of midbody and posterior horn.  Medial femoral condyle flexion bearing surface: Grade 4 Medial femoral condyle extension bearing surface: Grade 4 Medial tibial plateau: Grade 4 Anterior cruciate ligament:stable Posterior cruciate ligament:stable Lateral meniscus: normal.   Lateral femoral condyle flexion bearing surface: Grade 1 Lateral femoral condyle extension bearing surface: Grade 1 Lateral tibial plateau: Grade 1  DISPOSITION: The patient was awakened from general anesthetic, extubated, taken to the recovery room in medically stable condition, no apparent complications. The patient may be weightbearing as tolerated to the operative lower extremity.  Range of motion of right knee as tolerated.  Russell Cecil, MD [place] 9:27 AM

## 2017-10-19 ENCOUNTER — Encounter (HOSPITAL_COMMUNITY): Payer: Self-pay | Admitting: Orthopaedic Surgery

## 2017-10-19 NOTE — Progress Notes (Signed)
Can hold off on diuretics for now. If he were to develop symptoms, can try Lasix 20 mg daily x 2 days and reassess. Please call him back on 10/23/17.

## 2017-10-19 NOTE — Progress Notes (Signed)
EPIC Encounter for ICM Monitoring  Patient Name: Russell Werner is a 62 y.o. male Date: 10/19/2017 Primary Care Physican: Dettinger, Fransisca Kaufmann, MD Primary Cardiologist:Koneswaran Electrophysiologist:Allred Dry Weight:280lbs      Heart Failure questions reviewed, pt asymptomatic.  Patient had knee arthroscopy 10/18/2017 and received IV fluids.  He says he is doing well and no fluid symptoms at this time.    Thoracic impedance abnormal suggesting fluid accumulation starting 10/14/2017.  No diuretic (has taken a diuretic in the past)  Recommendations:  Reinforced fluid restriction to < 2 L daily and sodium restriction to less than 2000 mg daily.  Encouraged to call for fluid symptoms.  Follow-up plan: ICM clinic phone appointment on 10/27/2017 to recheck fluid symptoms.    Copy of ICM check sent to Dr. Rayann Heman and Dr Bronson Ing for review and will call back with any recommendations.   3 month ICM trend: 10/19/2017    1 Year ICM trend:       Rosalene Billings, RN 10/19/2017 2:34 PM

## 2017-10-23 ENCOUNTER — Ambulatory Visit (INDEPENDENT_AMBULATORY_CARE_PROVIDER_SITE_OTHER): Payer: Self-pay

## 2017-10-23 ENCOUNTER — Telehealth: Payer: Self-pay

## 2017-10-23 DIAGNOSIS — Z95810 Presence of automatic (implantable) cardiac defibrillator: Secondary | ICD-10-CM

## 2017-10-23 DIAGNOSIS — I5022 Chronic systolic (congestive) heart failure: Secondary | ICD-10-CM

## 2017-10-23 NOTE — Telephone Encounter (Signed)
Attempted ICM follow up call requesting to send remote transmission today for review.  Left number for call back.

## 2017-10-23 NOTE — Progress Notes (Signed)
Attempted call back to patient for follow.  Left message for call back and requesting him to send remote transmission today.

## 2017-10-24 ENCOUNTER — Ambulatory Visit (INDEPENDENT_AMBULATORY_CARE_PROVIDER_SITE_OTHER): Payer: Medicare HMO | Admitting: Orthopaedic Surgery

## 2017-10-24 NOTE — Progress Notes (Signed)
EPIC Encounter for ICM Monitoring  Patient Name: Russell Werner is a 62 y.o. male Date: 10/24/2017 Primary Care Physican: Dettinger, Fransisca Kaufmann, MD Primary Cardiologist:Koneswaran Electrophysiologist:Allred Dry Weight:280lbs      Heart Failure questions reviewed, pt asymptomatic.  Weight decreased from 289 lb after knee surgery to 280 lbs today.  He says he is getting rid of the fluid that had accumulated.   Thoracic impedance returned to normal after surgery.  No diuretic   Recommendations: No changes.   Encouraged to call for fluid symptoms.  Follow-up plan: ICM clinic phone appointment on 11/20/2017.    Copy of ICM check sent to Dr. Bronson Ing and Dr. Rayann Heman.   3 month ICM trend: 10/24/2017    1 Year ICM trend:       Rosalene Billings, RN 10/24/2017 10:36 AM

## 2017-10-27 ENCOUNTER — Encounter (INDEPENDENT_AMBULATORY_CARE_PROVIDER_SITE_OTHER): Payer: Self-pay | Admitting: Orthopaedic Surgery

## 2017-10-27 ENCOUNTER — Ambulatory Visit (INDEPENDENT_AMBULATORY_CARE_PROVIDER_SITE_OTHER): Payer: Medicare HMO | Admitting: Orthopaedic Surgery

## 2017-10-27 DIAGNOSIS — Z6841 Body Mass Index (BMI) 40.0 and over, adult: Secondary | ICD-10-CM | POA: Insufficient documentation

## 2017-10-27 DIAGNOSIS — Z9889 Other specified postprocedural states: Secondary | ICD-10-CM | POA: Insufficient documentation

## 2017-10-27 NOTE — Progress Notes (Signed)
Post-Op Visit Note   Patient: Russell Werner           Date of Birth: 05-23-1955           MRN: [id number] Visit Date: 10/27/2017 PCP: Dettinger, Fransisca Kaufmann, MD   Assessment & Plan:  Chief Complaint:  Chief Complaint  Patient presents with  . Right Knee - Routine Post Op    10/18/17 arthroscopy with PMM   Visit Diagnoses:  1. S/P right knee arthroscopy   2. Body mass index 40.0-44.9, adult ([place])   3. Morbid obesity ([place])     Plan: Patient is a pleasant 62 year old gentleman who presents to our clinic today 9 days status post right knee arthroscopic debridement medial meniscus, date of surgery 10/18/2017.  He has been doing excellent since surgery.  No pain.  No fevers, chills or any other systemic symptoms.  Examination of his right knee shows well-healing surgical portals with nylon sutures in place.  No evidence of infection or cellulitis.  His calf is soft and nontender.  He is neurovascularly intact distally.  Today, sutures were removed.  He will gradually increase activity as tolerated.  He was provided with range of motion exercises for the knee.  He will follow-up with [place] in 4 weeks time for recheck.  He is doing well at that point without any concerns he will call and let us know and cancel his appointment.  Follow-Up Instructions: Return in about 1 month (around 11/24/2017).   Orders:  No orders of the defined types were placed in this encounter.  No orders of the defined types were placed in this encounter.   Imaging: No results found.  PMFS History: Patient Active Problem List   Diagnosis Date Noted  . S/P right knee arthroscopy 10/27/2017  . Body mass index 40.0-44.9, adult ([place]) 10/27/2017  . Morbid obesity ([place]) 10/12/2017  . Acute medial meniscus tear of right knee 10/10/2017  . Nonischemic cardiomyopathy ([place]) 05/03/2017  . Obstructive sleep apnea treated with continuous positive airway pressure (CPAP) 04/14/2017  . Sleep apnea, obstructive 10/12/2016  .  CHF (congestive heart failure) ([place]) 10/12/2016  . Hypertension 10/12/2016  . BPH (benign prostatic hyperplasia) 10/12/2016  . History of melanoma 10/12/2016   Past Medical History:  Diagnosis Date  . 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  . Arthritis    "fingers" (05/03/2017)  . CHF (congestive heart failure) ([place])   . COPD (chronic obstructive pulmonary disease) ([place])   . Coronary artery disease    nonobstructive mid RCA by 2009 cath at [facility] in [place]  . Enlarged prostate   . High cholesterol   . Hypertension   . Melanoma of back ([place]) 2013  . Meniscal injury   . Myocardial infarction ([place]) 1998   mild  . OSA on CPAP    "severe" (05/03/2017)  . Right knee meniscal tear     Family History  Problem Relation Age of Onset  . Early death Mother   . Cancer Sister        breast  . HIV Brother   . Cancer Maternal Grandmother        breast  . Stroke Maternal Grandfather   . Cancer Sister        melanoma    Past Surgical History:  Procedure Laterality Date  . CARDIAC CATHETERIZATION  X 2  . CARDIAC DEFIBRILLATOR PLACEMENT  05/03/2017  . CYST REMOVAL LEG Bilateral 2013  . HERNIA REPAIR    . ICD IMPLANT  N/A 05/03/2017   Procedure: ICD IMPLANT;  Surgeon: Thompson Grayer, MD;  Location: [facility];  Service: Cardiovascular;  Laterality: N/A;  . KNEE ARTHROSCOPY WITH MEDIAL MENISECTOMY Right 10/18/2017   Procedure: RIGHT KNEE ARTHROSCOPY WITH PARTIAL MEDIAL MENISCECTOMY;  Surgeon: Leandrew Koyanagi, MD;  Location: [facility];  Service: Orthopedics;  Laterality: Right;  . KNEE CARTILAGE SURGERY Left 1972  . LAPAROSCOPIC CHOLECYSTECTOMY  2015  . LAPAROSCOPIC GASTRIC BAND REMOVAL WITH LAPAROSCOPIC GASTRIC SLEEVE RESECTION  11/05/2014  . MELANOMA EXCISION  2013   "back"  . UMBILICAL HERNIA REPAIR  2014   Social History   Occupational History  . Not on file  Tobacco Use  . Smoking status: Former Smoker    Packs/day: 1.00    Years: 47.00     Pack years: 47.00    Types: Cigarettes    Start date: 06/24/1967    Last attempt to quit: 05/02/2013    Years since quitting: 4.4  . Smokeless tobacco: Never Used  Substance and Sexual Activity  . Alcohol use: Yes    Comment:  "1-2 glasses of wine/month"  . Drug use: No  . Sexual activity: Yes

## 2017-11-07 LAB — CUP PACEART REMOTE DEVICE CHECK
Battery Remaining Percentage: 93 %
Battery Voltage: 3.19 V
Date Time Interrogation Session: 20190619060016
HIGH POWER IMPEDANCE MEASURED VALUE: 61 Ohm
HighPow Impedance: 61 Ohm
Implantable Lead Implant Date: 20190102
Implantable Lead Location: 753860
Implantable Pulse Generator Implant Date: 20190102
Lead Channel Impedance Value: 510 Ohm
Lead Channel Sensing Intrinsic Amplitude: 11.5 mV
Lead Channel Setting Pacing Amplitude: 2.5 V
MDC IDC MSMT BATTERY REMAINING LONGEVITY: 101 mo
MDC IDC MSMT LEADCHNL RV PACING THRESHOLD AMPLITUDE: 0.5 V
MDC IDC MSMT LEADCHNL RV PACING THRESHOLD PULSEWIDTH: 0.5 ms
MDC IDC SET LEADCHNL RV PACING PULSEWIDTH: 0.5 ms
MDC IDC SET LEADCHNL RV SENSING SENSITIVITY: 0.5 mV
MDC IDC STAT BRADY RV PERCENT PACED: 1 %
Pulse Gen Serial Number: 9786935

## 2017-11-10 DIAGNOSIS — G4733 Obstructive sleep apnea (adult) (pediatric): Secondary | ICD-10-CM | POA: Diagnosis not present

## 2017-11-16 ENCOUNTER — Encounter: Payer: Self-pay | Admitting: Family Medicine

## 2017-11-17 DIAGNOSIS — G4733 Obstructive sleep apnea (adult) (pediatric): Secondary | ICD-10-CM | POA: Diagnosis not present

## 2017-11-20 ENCOUNTER — Ambulatory Visit (INDEPENDENT_AMBULATORY_CARE_PROVIDER_SITE_OTHER): Payer: Medicare HMO

## 2017-11-20 DIAGNOSIS — Z95810 Presence of automatic (implantable) cardiac defibrillator: Secondary | ICD-10-CM

## 2017-11-20 DIAGNOSIS — I5022 Chronic systolic (congestive) heart failure: Secondary | ICD-10-CM | POA: Diagnosis not present

## 2017-11-20 NOTE — Progress Notes (Signed)
EPIC Encounter for ICM Monitoring  Patient Name: Russell Werner is a 62 y.o. male Date: 11/20/2017 Primary Care Physican: Dettinger, Fransisca Kaufmann, MD Primary Cardiologist:Koneswaran Electrophysiologist:Allred Dry Weight:280lbs       Heart Failure questions reviewed, pt asymptomatic.   Thoracic impedance normal but was abnormal suggesting fluid accumulation from 11/05/2017 - 11/18/2017.  No diuretic   Recommendations: No changes.  Encouraged to call for fluid symptoms.  Follow-up plan: ICM clinic phone appointment on 12/21/2017.       Copy of ICM check sent to Dr. Rayann Heman and Dr Bronson Ing.   3 month ICM trend: 11/20/2017     1 Year ICM trend:       Rosalene Billings, RN 11/20/2017 9:40 AM

## 2017-11-24 ENCOUNTER — Ambulatory Visit (INDEPENDENT_AMBULATORY_CARE_PROVIDER_SITE_OTHER): Payer: Medicare HMO | Admitting: Orthopaedic Surgery

## 2017-12-19 ENCOUNTER — Other Ambulatory Visit: Payer: Self-pay | Admitting: Family Medicine

## 2017-12-19 DIAGNOSIS — N529 Male erectile dysfunction, unspecified: Secondary | ICD-10-CM

## 2017-12-21 ENCOUNTER — Ambulatory Visit (INDEPENDENT_AMBULATORY_CARE_PROVIDER_SITE_OTHER): Payer: Medicare HMO

## 2017-12-21 DIAGNOSIS — I5022 Chronic systolic (congestive) heart failure: Secondary | ICD-10-CM

## 2017-12-21 DIAGNOSIS — Z95810 Presence of automatic (implantable) cardiac defibrillator: Secondary | ICD-10-CM | POA: Diagnosis not present

## 2017-12-21 NOTE — Progress Notes (Signed)
EPIC Encounter for ICM Monitoring  Patient Name: Russell Werner is a 62 y.o. male Date: 12/21/2017 Primary Care Physican: Dettinger, Fransisca Kaufmann, MD Primary Cardiologist:Koneswaran Electrophysiologist:Allred Dry Weight: 227 lbs      Heart Failure questions reviewed, pt has some swelling in ankles/feet but has resolved.   He was on vacation for the last week and ate out at restaurants.   Advised restaurant food is high in salt.     Thoracic impedance abnormal suggesting fluid accumulation starting 12/12/2017.  No diuretic  Recommendations:   Reinforced restricting salt intake.  Encouraged to call for fluid symptoms.  Follow-up plan: ICM clinic phone appointment on 12/26/2017 (manual send) to recheck fluid levels.     Copy of ICM check sent to Dr. Rayann Heman and Dr Bronson Ing.   3 month ICM trend: 12/21/2017    1 Year ICM trend:       Rosalene Billings, RN 12/21/2017 3:44 PM

## 2017-12-23 ENCOUNTER — Encounter (HOSPITAL_COMMUNITY): Payer: Self-pay | Admitting: Orthopaedic Surgery

## 2017-12-23 NOTE — OR Nursing (Signed)
Late entry due to correction of data entry error.  

## 2017-12-26 ENCOUNTER — Telehealth: Payer: Self-pay

## 2017-12-26 ENCOUNTER — Ambulatory Visit (INDEPENDENT_AMBULATORY_CARE_PROVIDER_SITE_OTHER): Payer: Medicare HMO

## 2017-12-26 DIAGNOSIS — Z95810 Presence of automatic (implantable) cardiac defibrillator: Secondary | ICD-10-CM

## 2017-12-26 DIAGNOSIS — I5022 Chronic systolic (congestive) heart failure: Secondary | ICD-10-CM

## 2017-12-26 NOTE — Telephone Encounter (Signed)
LMOVM reminding pt to send remote transmission.   

## 2017-12-26 NOTE — Telephone Encounter (Signed)
Medicare denied prior auth for Sildenafil   Not a covered benefit

## 2017-12-27 NOTE — Telephone Encounter (Signed)
Yes I can inform the patient that he might have to pay out of pocket, just let them know that that is what came through an RN.

## 2017-12-27 NOTE — Telephone Encounter (Signed)
Pharmacy notified insurance will not cover Pt will use discount card

## 2017-12-27 NOTE — Progress Notes (Signed)
EPIC Encounter for ICM Monitoring  Patient Name: Russell Werner is a 62 y.o. male Date: 12/27/2017 Primary Care Physican: Dettinger, Fransisca Kaufmann, MD Primary Cardiologist:Koneswaran Electrophysiologist:Allred Dry Weight: 270 lbs          Heart Failure questions reviewed, pt asymptomatic.   Thoracic impedance returned to normal since last remote transmission on 12/21/2017.  Total of 40 days of suggested fluid accumulation since 10/11/17.  No diuretic  Recommendations: No changes.   Encouraged to call for fluid symptoms.  Follow-up plan: ICM clinic phone appointment on 01/25/2018.       Copy of ICM check sent to Dr. Rayann Heman.   3 month ICM trend: 12/27/2017    1 Year ICM trend:       Rosalene Billings, RN 12/27/2017 10:40 AM

## 2018-01-02 ENCOUNTER — Telehealth: Payer: Self-pay | Admitting: Cardiovascular Disease

## 2018-01-02 NOTE — Telephone Encounter (Signed)
MEDICATION ENTRESTO  Said that company is out of funds and can not supply his medication any longer

## 2018-01-02 NOTE — Telephone Encounter (Signed)
[place] patient - will fwd.

## 2018-01-03 NOTE — Telephone Encounter (Signed)
Called pt. No answer/no voicemail. Will try again later.

## 2018-01-05 NOTE — Telephone Encounter (Signed)
Called pt. No answer, left message for pt to return call.  

## 2018-01-08 NOTE — Telephone Encounter (Signed)
Called pt. No answer, left message for pt to return call.  

## 2018-01-09 MED ORDER — LOSARTAN POTASSIUM 25 MG PO TABS: 25.0000 mg | ORAL_TABLET | Freq: Every day | ORAL | 3 refills | Status: DC

## 2018-01-09 NOTE — Telephone Encounter (Signed)
Spoke with pt. He states that he cannot afford the entresto. He was getting assistance, but they have ran out of funds for this year. He would like to switch to a different medication that his medicare will cover. Please advise.

## 2018-01-09 NOTE — Telephone Encounter (Signed)
I would first contact rep to see if they can help this gentleman.

## 2018-01-09 NOTE — Telephone Encounter (Signed)
Pt made aware of switch. He voiced understanding. Sent in RX.

## 2018-01-09 NOTE — Telephone Encounter (Signed)
Switch to losartan 25 mg daily 

## 2018-01-09 NOTE — Telephone Encounter (Signed)
Patient called [facility] office. Will forward message to [place] office.

## 2018-01-09 NOTE — Telephone Encounter (Signed)
He made too much money for the entresto assistance. They set him up with the [employer], which is now out of the money. So, there is no other assistance for this pt at this time and he cannot afford the $180 per month co-pay.

## 2018-01-17 ENCOUNTER — Telehealth: Payer: Self-pay | Admitting: Cardiology

## 2018-01-17 ENCOUNTER — Encounter: Payer: Medicare HMO | Admitting: *Deleted

## 2018-01-17 NOTE — Telephone Encounter (Signed)
LMOVM reminding pt to send remote transmission.   

## 2018-01-18 ENCOUNTER — Encounter: Payer: Self-pay | Admitting: Cardiology

## 2018-01-25 ENCOUNTER — Ambulatory Visit (INDEPENDENT_AMBULATORY_CARE_PROVIDER_SITE_OTHER): Payer: Medicare HMO

## 2018-01-25 DIAGNOSIS — I5022 Chronic systolic (congestive) heart failure: Secondary | ICD-10-CM

## 2018-01-25 DIAGNOSIS — Z95810 Presence of automatic (implantable) cardiac defibrillator: Secondary | ICD-10-CM

## 2018-01-26 ENCOUNTER — Telehealth: Payer: Self-pay

## 2018-01-26 NOTE — Progress Notes (Signed)
EPIC Encounter for ICM Monitoring  Patient Name: Russell Werner is a 62 y.o. male Date: 01/26/2018 Primary Care Physican: Dettinger, Fransisca Kaufmann, MD Primary Cardiologist:Koneswaran Electrophysiologist:Allred Dry Weight:Previous weight 270lbs       Attempted call to patient and unable to reach.  Left detailed message, per DPR, regarding transmission.  Transmission reviewed.    Thoracic impedance normal but was abnormal suggesting fluid accumulation from 01/07/2018 - 01/16/2018.  No diuretic  Recommendations: Left voice mail with ICM number and encouraged to call if experiencing any fluid symptoms.  Follow-up plan: ICM clinic phone appointment on 02/26/2018.   Has recall for office appointment 03/28/2018 with Dr. Bronson Ing.    Copy of ICM check sent to Dr. Rayann Heman.   3 month ICM trend: 01/25/2018    1 Year ICM trend:       Rosalene Billings, RN 01/26/2018 11:41 AM

## 2018-01-26 NOTE — Telephone Encounter (Signed)
Remote ICM transmission received.  Attempted call to patient and left detailed message, per DPR, regarding transmission and next ICM scheduled for 02/26/2018.  Advised to return call for any fluid symptoms or questions.

## 2018-01-31 ENCOUNTER — Other Ambulatory Visit: Payer: Self-pay | Admitting: Cardiovascular Disease

## 2018-02-02 ENCOUNTER — Telehealth: Payer: Self-pay | Admitting: Cardiovascular Disease

## 2018-02-02 ENCOUNTER — Telehealth: Payer: Self-pay | Admitting: *Deleted

## 2018-02-02 NOTE — Telephone Encounter (Signed)
[place] patient.   

## 2018-02-02 NOTE — Telephone Encounter (Signed)
Please call patient in regards to him going back on Entresto. [phone].

## 2018-02-05 MED ORDER — SACUBITRIL-VALSARTAN 24-26 MG PO TABS: 1.0000 | ORAL_TABLET | Freq: Two times a day (BID) | ORAL | 3 refills | Status: DC

## 2018-02-05 NOTE — Telephone Encounter (Signed)
Pt notified. New RX sent.

## 2018-02-05 NOTE — Telephone Encounter (Signed)
Pt returned call. He stated that he had to come off of Entresto earlier this year due to his funding being cut through the [employer]. He received a call last week stating he gets his funding again, and wants to know if Dr. Bronson Ing would rather him stay on Losartan or go back to [facility] 24/26. Please advise.

## 2018-02-05 NOTE — Telephone Encounter (Signed)
Called pt. No answer, lmtcb 

## 2018-02-05 NOTE — Telephone Encounter (Signed)
After a 36 hr washout period, I would prefer he go back on Entresto.

## 2018-02-05 NOTE — Telephone Encounter (Signed)
Pt returned call, pt interested in study.  Will touch base in 2 weeks when he returns home.

## 2018-02-22 ENCOUNTER — Telehealth: Payer: Self-pay | Admitting: *Deleted

## 2018-02-22 NOTE — Telephone Encounter (Signed)
Called pt to follow up about BEAT HF study. Russell Werner stated that he was currently in [place] with his father and would contact [employer] if he returned to [place].

## 2018-03-22 NOTE — Progress Notes (Signed)
No ICM remote transmission received for 02/26/2018 and next ICM transmission scheduled for 04/12/2018.

## 2018-04-12 ENCOUNTER — Ambulatory Visit (INDEPENDENT_AMBULATORY_CARE_PROVIDER_SITE_OTHER): Payer: Medicare HMO

## 2018-04-12 ENCOUNTER — Telehealth: Payer: Self-pay | Admitting: Cardiology

## 2018-04-12 DIAGNOSIS — I5022 Chronic systolic (congestive) heart failure: Secondary | ICD-10-CM | POA: Diagnosis not present

## 2018-04-12 DIAGNOSIS — Z95810 Presence of automatic (implantable) cardiac defibrillator: Secondary | ICD-10-CM

## 2018-04-12 DIAGNOSIS — I428 Other cardiomyopathies: Secondary | ICD-10-CM

## 2018-04-12 NOTE — Telephone Encounter (Signed)
LMOVM reminding pt to send remote transmission.   

## 2018-04-13 ENCOUNTER — Ambulatory Visit: Payer: Medicare HMO | Admitting: Family Medicine

## 2018-04-13 ENCOUNTER — Encounter: Payer: Self-pay | Admitting: Cardiology

## 2018-04-13 NOTE — Progress Notes (Signed)
Remote ICD transmission.   

## 2018-04-13 NOTE — Progress Notes (Signed)
EPIC Encounter for ICM Monitoring  Patient Name: Russell Werner is a 62 y.o. male Date: 04/13/2018 Primary Care Physican: No primary care provider on file. Primary Cardiologist:Koneswaran Electrophysiologist:Allred Last weight 270lbs                                                      Heart failure questions reviewed.  He is temporarily living in [place] to settle fathers estate that recently passed away and plans to permanently move to [place] after leaving [place].     Thoracic impedance normal.  No diuretic  Recommendations:    Follow-up plan: ICM clinic phone appointment on 05/17/2018.   Has recall for office appointment 03/28/2018 with Dr. Bronson Ing.    Copy of ICM check sent to Dr. Rayann Heman.   3 month ICM trend: 04/12/2018    1 Year ICM trend:       Rosalene Billings, RN 04/13/2018 2:08 PM

## 2018-04-23 ENCOUNTER — Other Ambulatory Visit: Payer: Self-pay | Admitting: Cardiovascular Disease

## 2018-04-23 NOTE — Telephone Encounter (Signed)
This is Dr. Koneswaran's pt. °

## 2018-05-14 ENCOUNTER — Other Ambulatory Visit: Payer: Self-pay | Admitting: Family Medicine

## 2018-05-14 DIAGNOSIS — N400 Enlarged prostate without lower urinary tract symptoms: Secondary | ICD-10-CM

## 2018-05-14 NOTE — Telephone Encounter (Signed)
Last seen 10/12/17  Dr Dettinger

## 2018-05-22 NOTE — Progress Notes (Signed)
No ICM remote transmission received for 05/17/2018 and next ICM transmission scheduled for 06/05/2018.

## 2018-05-27 LAB — CUP PACEART REMOTE DEVICE CHECK
Battery Remaining Longevity: 97 mo
Battery Remaining Percentage: 90 %
Battery Voltage: 3.13 V
Brady Statistic RV Percent Paced: 1 %
HIGH POWER IMPEDANCE MEASURED VALUE: 61 Ohm
HIGH POWER IMPEDANCE MEASURED VALUE: 61 Ohm
Lead Channel Impedance Value: 530 Ohm
Lead Channel Pacing Threshold Pulse Width: 0.5 ms
Lead Channel Setting Pacing Amplitude: 2.5 V
Lead Channel Setting Pacing Pulse Width: 0.5 ms
MDC IDC LEAD IMPLANT DT: 20190102
MDC IDC LEAD LOCATION: 753860
MDC IDC MSMT LEADCHNL RV PACING THRESHOLD AMPLITUDE: 0.5 V
MDC IDC MSMT LEADCHNL RV SENSING INTR AMPL: 11.5 mV
MDC IDC PG IMPLANT DT: 20190102
MDC IDC SESS DTM: 20191212193155
MDC IDC SET LEADCHNL RV SENSING SENSITIVITY: 0.5 mV
Pulse Gen Serial Number: 9786935

## 2018-06-01 ENCOUNTER — Other Ambulatory Visit: Payer: Self-pay | Admitting: Cardiovascular Disease

## 2018-06-05 ENCOUNTER — Ambulatory Visit (INDEPENDENT_AMBULATORY_CARE_PROVIDER_SITE_OTHER): Payer: Medicare HMO

## 2018-06-05 DIAGNOSIS — Z95810 Presence of automatic (implantable) cardiac defibrillator: Secondary | ICD-10-CM

## 2018-06-05 DIAGNOSIS — I5022 Chronic systolic (congestive) heart failure: Secondary | ICD-10-CM | POA: Diagnosis not present

## 2018-06-05 NOTE — Progress Notes (Signed)
EPIC Encounter for ICM Monitoring  Patient Name: Russell Werner is a 63 y.o. male Date: 06/05/2018 Primary Care Physican: Dettinger, Fransisca Kaufmann, MD Primary Care Physican: No primary care provider on file. Primary Cardiologist:Koneswaran Electrophysiologist:Allred Last weight285lbs  Heart failure questions reviewed.   Patient asymptomatic.  Patient has moved to [place] and looking for cardiologist.     Thoracic impedance slightly below normal.  No diuretic  Recommendations:   Follow-up plan: ICM clinic phone appointment on3/01/2019.  Copy of ICM check sent to Dr.Allred.   3 month ICM trend: 06/05/2018    1 Year ICM trend:       Rosalene Billings, RN 06/05/2018 11:13 AM

## 2018-06-14 DIAGNOSIS — L0390 Cellulitis, unspecified: Secondary | ICD-10-CM | POA: Insufficient documentation

## 2018-06-14 DIAGNOSIS — M79602 Pain in left arm: Secondary | ICD-10-CM | POA: Diagnosis not present

## 2018-06-14 DIAGNOSIS — I10 Essential (primary) hypertension: Secondary | ICD-10-CM | POA: Diagnosis not present

## 2018-06-14 DIAGNOSIS — L03114 Cellulitis of left upper limb: Secondary | ICD-10-CM | POA: Diagnosis not present

## 2018-06-15 DIAGNOSIS — M79602 Pain in left arm: Secondary | ICD-10-CM | POA: Diagnosis not present

## 2018-06-16 DIAGNOSIS — M79602 Pain in left arm: Secondary | ICD-10-CM | POA: Diagnosis not present

## 2018-06-21 DIAGNOSIS — E785 Hyperlipidemia, unspecified: Secondary | ICD-10-CM | POA: Diagnosis not present

## 2018-06-21 DIAGNOSIS — J449 Chronic obstructive pulmonary disease, unspecified: Secondary | ICD-10-CM | POA: Diagnosis not present

## 2018-06-21 DIAGNOSIS — Z09 Encounter for follow-up examination after completed treatment for conditions other than malignant neoplasm: Secondary | ICD-10-CM | POA: Diagnosis not present

## 2018-06-21 DIAGNOSIS — I509 Heart failure, unspecified: Secondary | ICD-10-CM | POA: Diagnosis not present

## 2018-06-21 DIAGNOSIS — Z1211 Encounter for screening for malignant neoplasm of colon: Secondary | ICD-10-CM | POA: Diagnosis not present

## 2018-06-27 DIAGNOSIS — M1732 Unilateral post-traumatic osteoarthritis, left knee: Secondary | ICD-10-CM | POA: Diagnosis not present

## 2018-06-27 DIAGNOSIS — Z9189 Other specified personal risk factors, not elsewhere classified: Secondary | ICD-10-CM | POA: Insufficient documentation

## 2018-06-27 DIAGNOSIS — I5022 Chronic systolic (congestive) heart failure: Secondary | ICD-10-CM | POA: Diagnosis not present

## 2018-06-27 DIAGNOSIS — E7800 Pure hypercholesterolemia, unspecified: Secondary | ICD-10-CM | POA: Diagnosis not present

## 2018-06-27 DIAGNOSIS — Z95810 Presence of automatic (implantable) cardiac defibrillator: Secondary | ICD-10-CM | POA: Diagnosis not present

## 2018-06-27 DIAGNOSIS — I10 Essential (primary) hypertension: Secondary | ICD-10-CM | POA: Diagnosis not present

## 2018-06-27 DIAGNOSIS — R69 Illness, unspecified: Secondary | ICD-10-CM | POA: Diagnosis not present

## 2018-06-27 DIAGNOSIS — Z9884 Bariatric surgery status: Secondary | ICD-10-CM | POA: Insufficient documentation

## 2018-06-27 DIAGNOSIS — G4733 Obstructive sleep apnea (adult) (pediatric): Secondary | ICD-10-CM | POA: Diagnosis not present

## 2018-07-09 ENCOUNTER — Ambulatory Visit (INDEPENDENT_AMBULATORY_CARE_PROVIDER_SITE_OTHER): Payer: Medicare HMO

## 2018-07-09 DIAGNOSIS — Z95810 Presence of automatic (implantable) cardiac defibrillator: Secondary | ICD-10-CM

## 2018-07-09 DIAGNOSIS — I5022 Chronic systolic (congestive) heart failure: Secondary | ICD-10-CM | POA: Diagnosis not present

## 2018-07-10 NOTE — Progress Notes (Signed)
EPIC Encounter for ICM Monitoring  Patient Name: Russell Werner is a 63 y.o. male Date: 07/10/2018 Primary Care Physican: Dettinger, Fransisca Kaufmann, MD Primary Cardiologist:Koneswaran Electrophysiologist:Allred Lastweight283lbs   Heart failure questions reviewed.  Patient asymptomatic but had cellulitis of left arm.  He has moved and established care with cardiologist but unsure if the physician will be managing the device.  Advised that once he has a physician that will follow him for his device, then the clinic will ask for transfer from out clinic to their clinic. Until then we will continue to follow at this time.  Per epic care everywhere notes, patient has established with cardiologist at [facility]    Thoracic impedance slightly below normal.  No diuretic  Recommendations:He   Follow-up plan: [facility] clinic phone appointment on4/13/2020.  Copy of ICM check sent to Dr.Allred.  3 month ICM trend: 07/09/2018    1 Year ICM trend:       Rosalene Billings, RN 07/10/2018 10:31 AM

## 2018-07-12 ENCOUNTER — Ambulatory Visit (INDEPENDENT_AMBULATORY_CARE_PROVIDER_SITE_OTHER): Payer: Medicare HMO | Admitting: *Deleted

## 2018-07-12 DIAGNOSIS — I428 Other cardiomyopathies: Secondary | ICD-10-CM

## 2018-07-14 LAB — CUP PACEART REMOTE DEVICE CHECK
Battery Remaining Longevity: 96 mo
Battery Voltage: 3.08 V
Date Time Interrogation Session: 20200312060026
HighPow Impedance: 59 Ohm
HighPow Impedance: 59 Ohm
Implantable Lead Location: 753860
Lead Channel Impedance Value: 580 Ohm
Lead Channel Sensing Intrinsic Amplitude: 11.5 mV
Lead Channel Setting Pacing Amplitude: 2.5 V
MDC IDC LEAD IMPLANT DT: 20190102
MDC IDC MSMT BATTERY REMAINING PERCENTAGE: 88 %
MDC IDC MSMT LEADCHNL RV PACING THRESHOLD AMPLITUDE: 0.5 V
MDC IDC MSMT LEADCHNL RV PACING THRESHOLD PULSEWIDTH: 0.5 ms
MDC IDC PG IMPLANT DT: 20190102
MDC IDC SET LEADCHNL RV PACING PULSEWIDTH: 0.5 ms
MDC IDC SET LEADCHNL RV SENSING SENSITIVITY: 0.5 mV
MDC IDC STAT BRADY RV PERCENT PACED: 1 %
Pulse Gen Serial Number: 9786935

## 2018-07-16 DIAGNOSIS — I5022 Chronic systolic (congestive) heart failure: Secondary | ICD-10-CM | POA: Diagnosis not present

## 2018-07-16 DIAGNOSIS — I491 Atrial premature depolarization: Secondary | ICD-10-CM | POA: Diagnosis not present

## 2018-07-16 DIAGNOSIS — Z9189 Other specified personal risk factors, not elsewhere classified: Secondary | ICD-10-CM | POA: Diagnosis not present

## 2018-07-16 DIAGNOSIS — R9439 Abnormal result of other cardiovascular function study: Secondary | ICD-10-CM | POA: Diagnosis not present

## 2018-07-16 DIAGNOSIS — I10 Essential (primary) hypertension: Secondary | ICD-10-CM | POA: Diagnosis not present

## 2018-07-16 DIAGNOSIS — I110 Hypertensive heart disease with heart failure: Secondary | ICD-10-CM | POA: Diagnosis not present

## 2018-07-17 ENCOUNTER — Other Ambulatory Visit: Payer: Self-pay

## 2018-07-17 DIAGNOSIS — Z1211 Encounter for screening for malignant neoplasm of colon: Secondary | ICD-10-CM | POA: Diagnosis not present

## 2018-07-17 DIAGNOSIS — J449 Chronic obstructive pulmonary disease, unspecified: Secondary | ICD-10-CM | POA: Diagnosis not present

## 2018-07-17 DIAGNOSIS — Z95810 Presence of automatic (implantable) cardiac defibrillator: Secondary | ICD-10-CM | POA: Diagnosis not present

## 2018-07-17 DIAGNOSIS — I428 Other cardiomyopathies: Secondary | ICD-10-CM | POA: Diagnosis not present

## 2018-07-17 DIAGNOSIS — I10 Essential (primary) hypertension: Secondary | ICD-10-CM | POA: Diagnosis not present

## 2018-07-17 DIAGNOSIS — Z6841 Body Mass Index (BMI) 40.0 and over, adult: Secondary | ICD-10-CM | POA: Diagnosis not present

## 2018-07-19 ENCOUNTER — Encounter: Payer: Self-pay | Admitting: Cardiology

## 2018-07-19 NOTE — Progress Notes (Signed)
Remote ICD transmission.   

## 2018-08-02 IMAGING — CR DG CHEST 2V
2 series · 2 of 2 positions shown · non-contrast
Comparison: None in PACs

CLINICAL DATA: Permanent pacemaker defibrillator placement.

EXAM:
CHEST  2 VIEW

[chest pa]
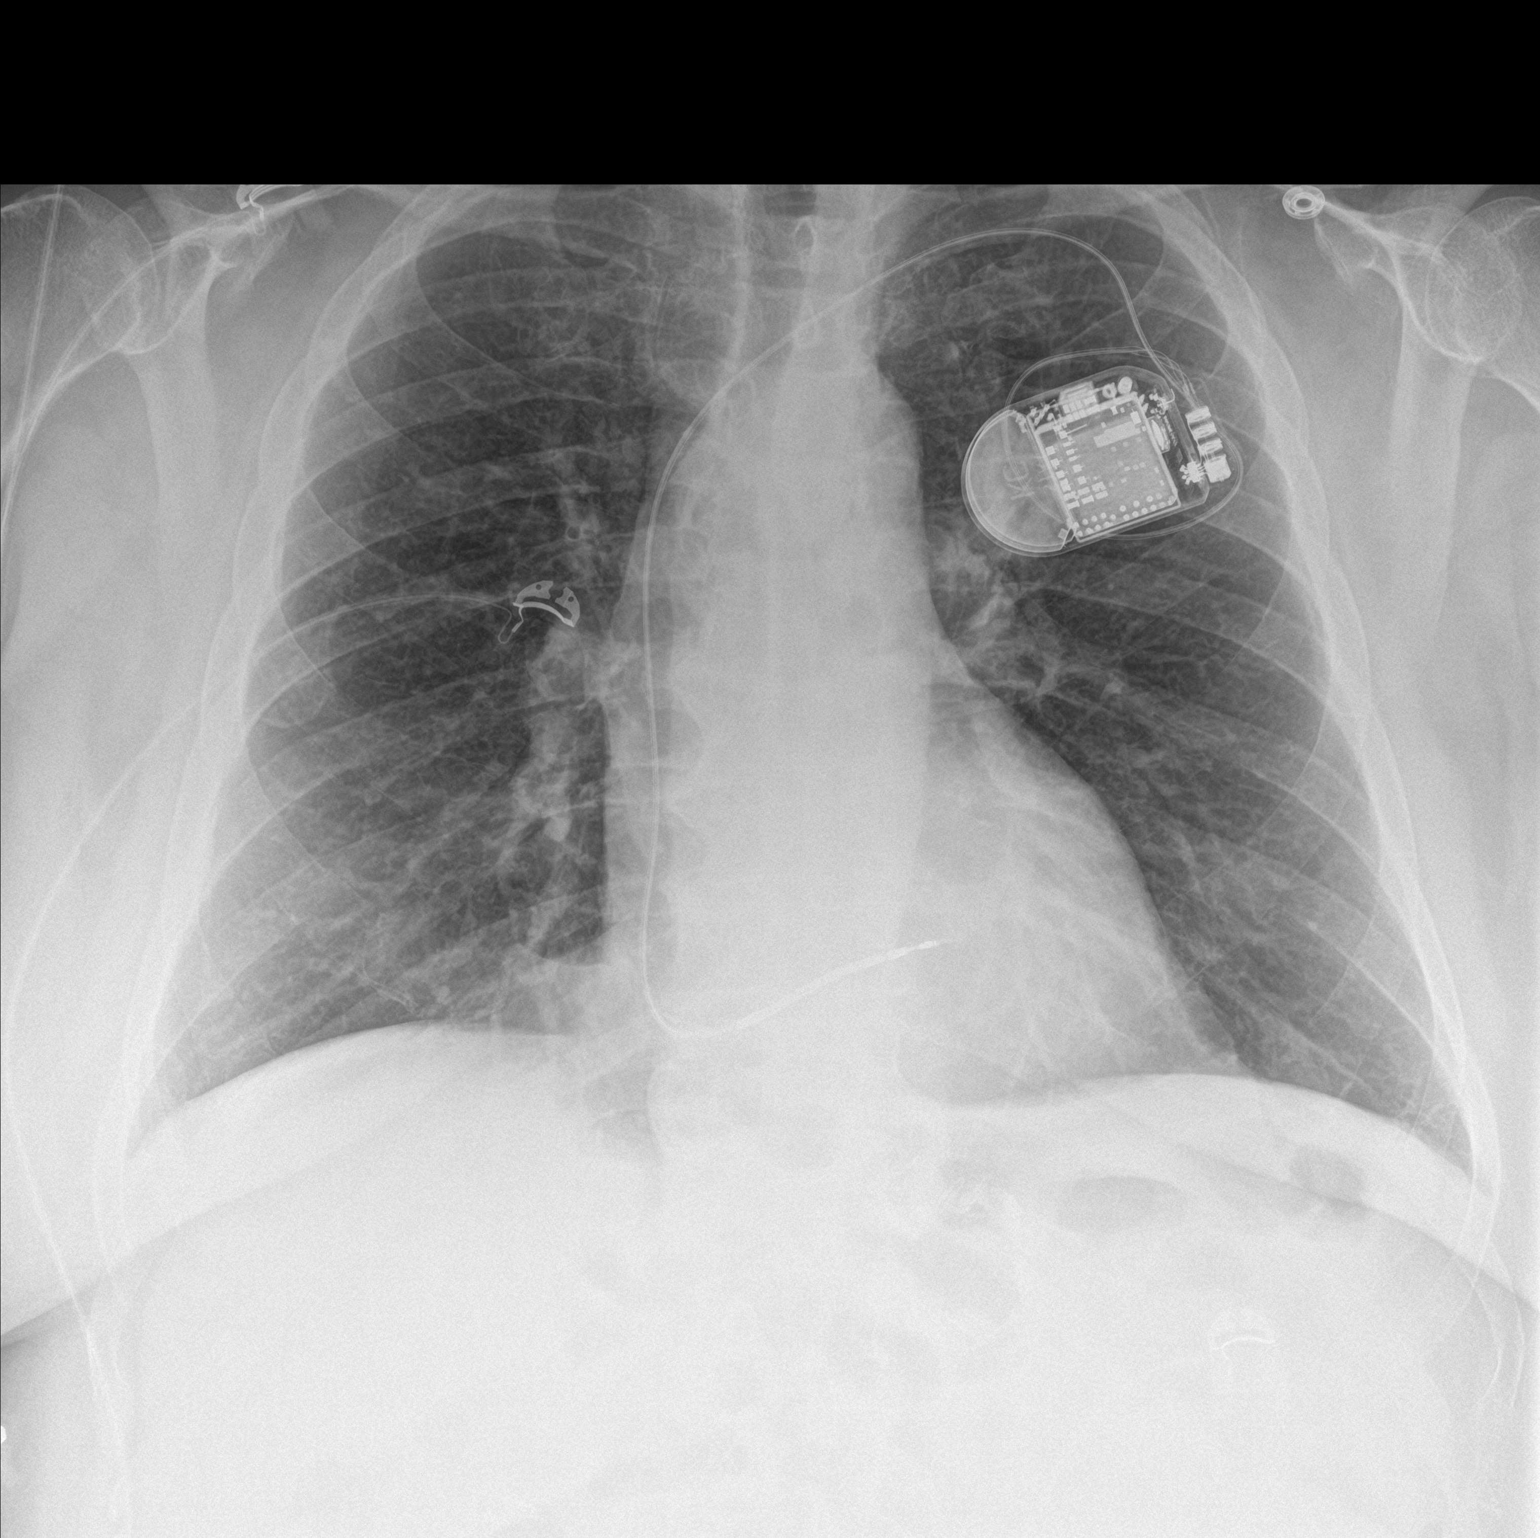

[chest lat]
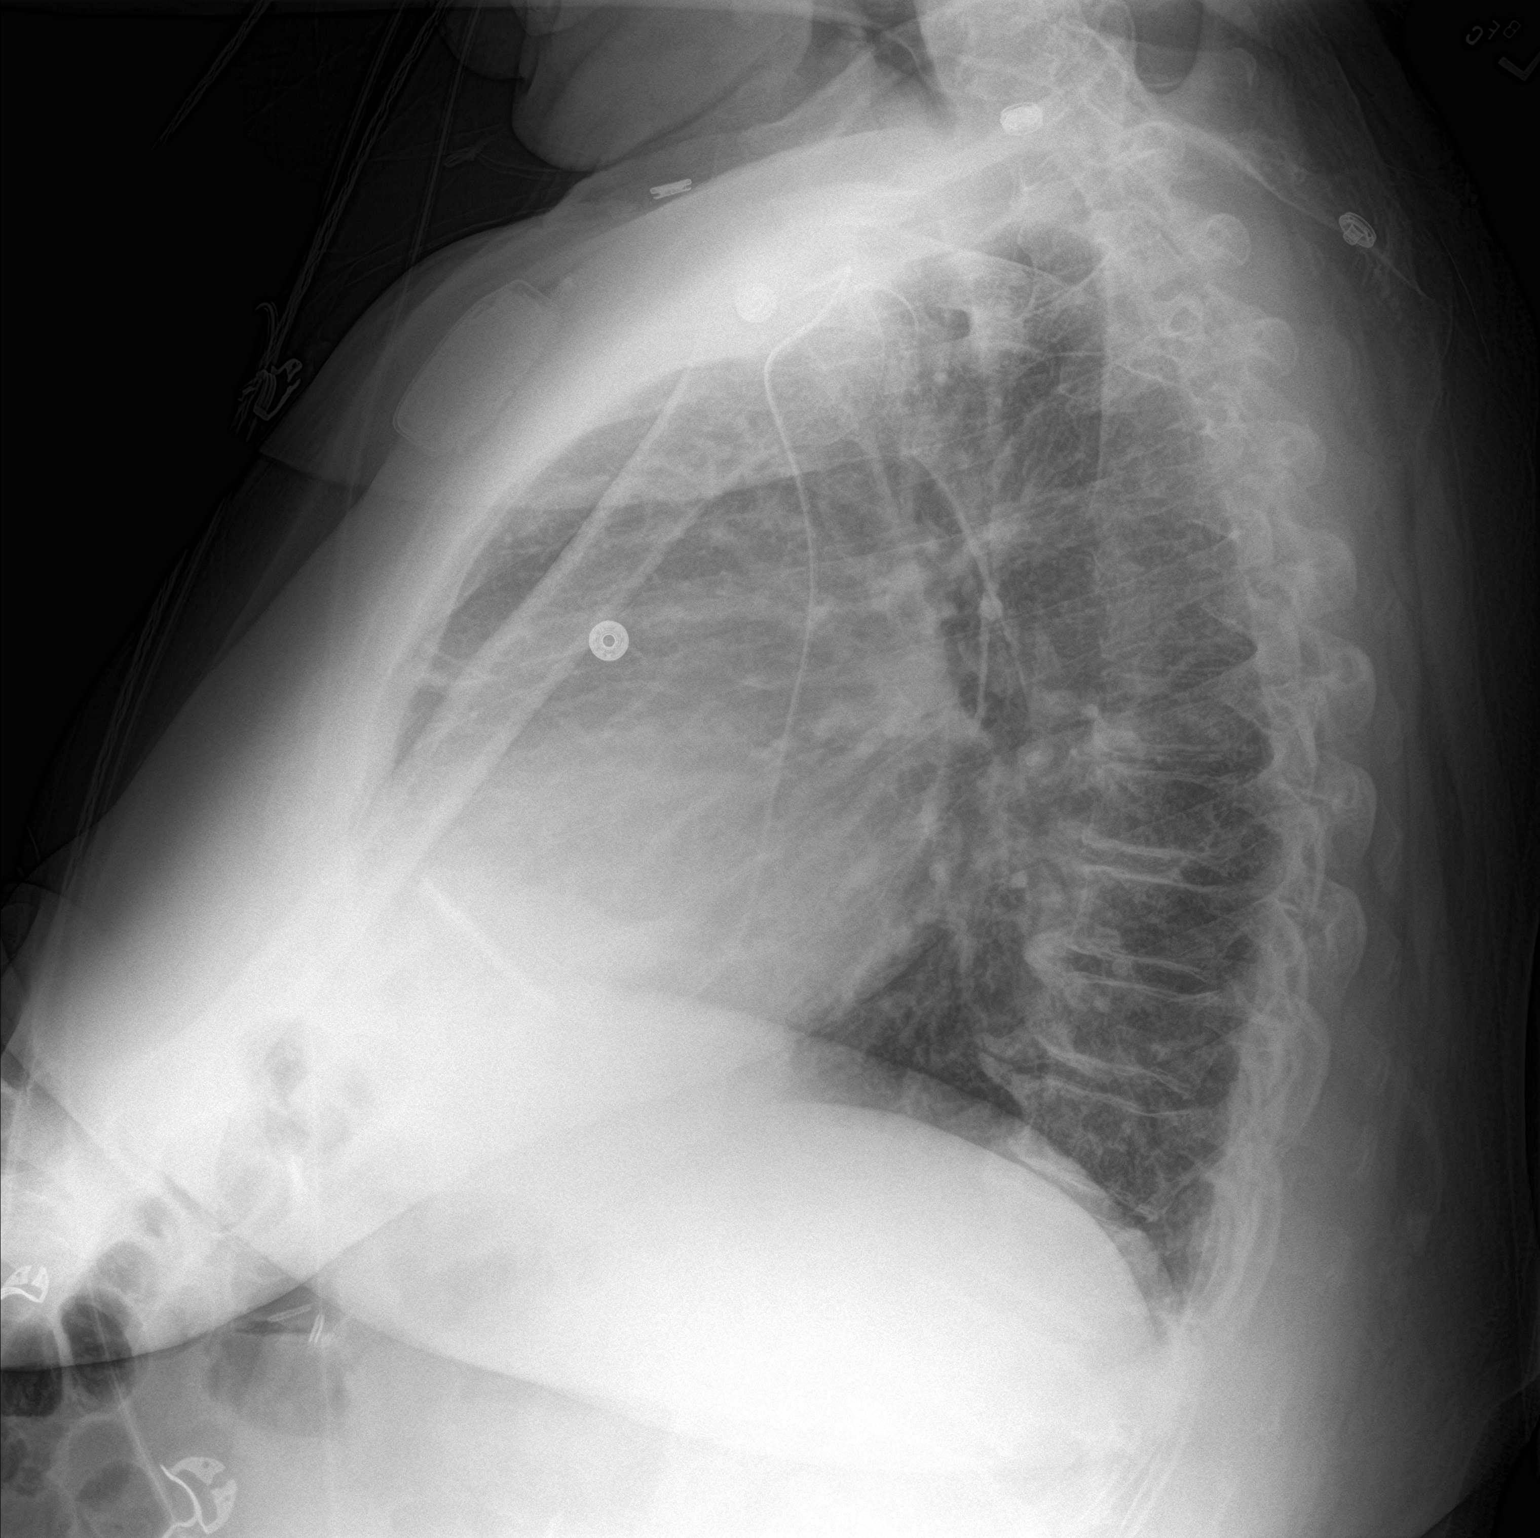

[2 of 2 positions shown; findings below may reference images not displayed]

FINDINGS: The lungs are well-expanded. There is no pneumothorax or hemothorax.
The interstitial markings are coarse. There is no alveolar
infiltrate. The heart and pulmonary vascularity are normal. The
single defibrillator electrode is in reasonable position
radiographically. The mediastinum is normal in width. There is
multilevel degenerative disc disease of the thoracic spine.
IMPRESSION: No postprocedure complication following permanent pacemaker
placement.

## 2018-08-03 ENCOUNTER — Other Ambulatory Visit: Payer: Self-pay | Admitting: Family Medicine

## 2018-08-03 DIAGNOSIS — N400 Enlarged prostate without lower urinary tract symptoms: Secondary | ICD-10-CM

## 2018-08-13 ENCOUNTER — Other Ambulatory Visit: Payer: Self-pay

## 2018-08-13 ENCOUNTER — Ambulatory Visit (INDEPENDENT_AMBULATORY_CARE_PROVIDER_SITE_OTHER): Payer: Medicare HMO

## 2018-08-13 DIAGNOSIS — I5022 Chronic systolic (congestive) heart failure: Secondary | ICD-10-CM

## 2018-08-13 DIAGNOSIS — Z95810 Presence of automatic (implantable) cardiac defibrillator: Secondary | ICD-10-CM

## 2018-08-13 NOTE — Progress Notes (Signed)
EPIC Encounter for ICM Monitoring  Patient Name: Russell Werner is a 63 y.o. male Date: 08/13/2018 Primary Care Physican: Dettinger, Fransisca Kaufmann, MD Primary Cardiologist:Koneswaran Electrophysiologist:Allred Lastweight283lbs   Attempted call to patient and unable to reach.   Transmission reviewed.   Pt has [place] out of state and established care with a cardiologist but unsure if the physician will be managing the device.  Per epic care everywhere notes, patient has established with cardiologist at [facility]   Thoracic impedanceslightly belowabnormal suggesting fluid accumulation starting 07/29/2018 and has returned close to baseline.  No diuretic  Recommendations:Unable to reach.   Follow-up plan: ICM clinic phone appointment on5/14/2020.  Copy of ICM check sent to Dr.Allred.    3 month ICM trend: 08/13/2018    1 Year ICM trend:       Rosalene Billings, RN 08/13/2018 2:11 PM

## 2018-08-15 ENCOUNTER — Telehealth: Payer: Self-pay

## 2018-08-15 NOTE — Telephone Encounter (Signed)
Remote ICM transmission received.  Attempted call to patient regarding ICM remote transmission and no answer.  

## 2018-08-29 DIAGNOSIS — R9439 Abnormal result of other cardiovascular function study: Secondary | ICD-10-CM | POA: Diagnosis not present

## 2018-08-29 DIAGNOSIS — G4733 Obstructive sleep apnea (adult) (pediatric): Secondary | ICD-10-CM | POA: Diagnosis not present

## 2018-08-29 DIAGNOSIS — Z9189 Other specified personal risk factors, not elsewhere classified: Secondary | ICD-10-CM | POA: Diagnosis not present

## 2018-08-29 DIAGNOSIS — I10 Essential (primary) hypertension: Secondary | ICD-10-CM | POA: Diagnosis not present

## 2018-08-29 DIAGNOSIS — Z9989 Dependence on other enabling machines and devices: Secondary | ICD-10-CM | POA: Diagnosis not present

## 2018-08-29 DIAGNOSIS — I5022 Chronic systolic (congestive) heart failure: Secondary | ICD-10-CM | POA: Diagnosis not present

## 2018-08-29 DIAGNOSIS — R69 Illness, unspecified: Secondary | ICD-10-CM | POA: Diagnosis not present

## 2018-08-29 DIAGNOSIS — E7800 Pure hypercholesterolemia, unspecified: Secondary | ICD-10-CM | POA: Diagnosis not present

## 2018-09-02 ENCOUNTER — Other Ambulatory Visit: Payer: Self-pay | Admitting: Family Medicine

## 2018-09-02 DIAGNOSIS — N400 Enlarged prostate without lower urinary tract symptoms: Secondary | ICD-10-CM

## 2018-09-03 NOTE — Telephone Encounter (Signed)
Dettinger. NTBS 30 days given 08/03/18

## 2018-09-12 ENCOUNTER — Other Ambulatory Visit: Payer: Self-pay | Admitting: *Deleted

## 2018-09-13 ENCOUNTER — Ambulatory Visit (INDEPENDENT_AMBULATORY_CARE_PROVIDER_SITE_OTHER): Payer: Self-pay

## 2018-09-13 ENCOUNTER — Other Ambulatory Visit: Payer: Self-pay

## 2018-09-13 DIAGNOSIS — Z95810 Presence of automatic (implantable) cardiac defibrillator: Secondary | ICD-10-CM

## 2018-09-13 DIAGNOSIS — I5022 Chronic systolic (congestive) heart failure: Secondary | ICD-10-CM

## 2018-09-17 NOTE — Progress Notes (Signed)
EPIC Encounter for ICM Monitoring  Patient Name: Russell Werner is a 63 y.o. male Date: 09/17/2018 Primary Care Physican: Dettinger, Fransisca Kaufmann, MD Primary Cardiologist:Koneswaran Electrophysiologist:Allred Lastweight283lbs   Transmission reviewed.    Pt has relocated out of state and established care with a cardiologist and per epic care everywhere notes, patient has established with cardiologist at [facility]  Thoracic impedancenormal  No diuretic  Recommendations:None  Follow-up plan: [facility] clinic phone appointment on6/16/2020.  Copy of ICM check sent to Dr.Allred.    3 month ICM trend: 09/13/2018    1 Year ICM trend:       Russell Billings, RN 09/17/2018 3:41 PM

## 2018-10-11 DIAGNOSIS — K5730 Diverticulosis of large intestine without perforation or abscess without bleeding: Secondary | ICD-10-CM | POA: Insufficient documentation

## 2018-10-15 ENCOUNTER — Ambulatory Visit (INDEPENDENT_AMBULATORY_CARE_PROVIDER_SITE_OTHER): Payer: Self-pay | Admitting: *Deleted

## 2018-10-15 DIAGNOSIS — I428 Other cardiomyopathies: Secondary | ICD-10-CM

## 2018-10-15 LAB — CUP PACEART REMOTE DEVICE CHECK
Battery Remaining Longevity: 94 mo
Battery Remaining Percentage: 85 %
Battery Voltage: 3.05 V
Brady Statistic RV Percent Paced: 1 %
Date Time Interrogation Session: 20200615060015
HighPow Impedance: 66 Ohm
HighPow Impedance: 66 Ohm
Implantable Lead Implant Date: 20190102
Implantable Lead Location: 753860
Implantable Pulse Generator Implant Date: 20190102
Lead Channel Impedance Value: 600 Ohm
Lead Channel Pacing Threshold Amplitude: 0.5 V
Lead Channel Pacing Threshold Pulse Width: 0.5 ms
Lead Channel Sensing Intrinsic Amplitude: 11.5 mV
Lead Channel Setting Pacing Amplitude: 2.5 V
Lead Channel Setting Pacing Pulse Width: 0.5 ms
Lead Channel Setting Sensing Sensitivity: 0.5 mV
Pulse Gen Serial Number: 9786935

## 2018-10-16 ENCOUNTER — Ambulatory Visit (INDEPENDENT_AMBULATORY_CARE_PROVIDER_SITE_OTHER): Payer: Medicare Other

## 2018-10-16 DIAGNOSIS — I5022 Chronic systolic (congestive) heart failure: Secondary | ICD-10-CM

## 2018-10-16 DIAGNOSIS — Z95810 Presence of automatic (implantable) cardiac defibrillator: Secondary | ICD-10-CM

## 2018-10-19 NOTE — Progress Notes (Signed)
EPIC Encounter for ICM Monitoring  Patient Name: Russell Werner is a 63 y.o. male Date: 10/19/2018 Primary Care Physican: Dettinger, Fransisca Kaufmann, MD Primary Cardiologist:Koneswaran Electrophysiologist:Allred Lastweight283lbs   Transmission reviewed.    [place] and established care with acardiologist and per epic care everywhere notes, patient has established with cardiologist at [facility]  Thoracic impedancenormal  No diuretic  Recommendations:None  Follow-up plan: [facility] clinic phone appointment on7/20/2020.  Copy of ICM check sent to Dr.Allred.   3 month ICM trend: 10/15/2018    1 Year ICM trend:       Rosalene Billings, RN 10/19/2018 2:50 PM

## 2018-10-25 ENCOUNTER — Encounter: Payer: Self-pay | Admitting: Cardiology

## 2018-10-25 NOTE — Progress Notes (Signed)
Remote ICD transmission.   

## 2018-11-19 ENCOUNTER — Ambulatory Visit (INDEPENDENT_AMBULATORY_CARE_PROVIDER_SITE_OTHER): Payer: Medicare Other

## 2018-11-19 DIAGNOSIS — Z95810 Presence of automatic (implantable) cardiac defibrillator: Secondary | ICD-10-CM | POA: Diagnosis not present

## 2018-11-19 DIAGNOSIS — I5022 Chronic systolic (congestive) heart failure: Secondary | ICD-10-CM | POA: Diagnosis not present

## 2018-11-21 DIAGNOSIS — R399 Unspecified symptoms and signs involving the genitourinary system: Secondary | ICD-10-CM | POA: Insufficient documentation

## 2018-11-23 NOTE — Progress Notes (Signed)
EPIC Encounter for ICM Monitoring  Patient Name: Russell Werner is a 63 y.o. male Date: 11/23/2018 Primary Care Physican: Dettinger, Fransisca Kaufmann, MD Primary Care Physican: Dettinger, Fransisca Kaufmann, MD Primary Cardiologist:Koneswaran Electrophysiologist:Allred Lastweight283lbs   Transmission reviewed.   CorVue thoracic impedancenormal but suggesting possible dryness  No diuretic  Recommendations:None  Follow-up plan: ICM clinic phone appointment on8/25/2020.  Copy of ICM check sent to Dr.Allred.    Direct Trend View 11/18/2018    Rosalene Billings, RN 11/23/2018 11:36 AM

## 2018-11-27 DIAGNOSIS — N400 Enlarged prostate without lower urinary tract symptoms: Secondary | ICD-10-CM | POA: Insufficient documentation

## 2018-12-17 ENCOUNTER — Telehealth: Payer: Self-pay | Admitting: Family Medicine

## 2018-12-17 ENCOUNTER — Telehealth: Payer: Self-pay | Admitting: Cardiovascular Disease

## 2018-12-17 NOTE — Telephone Encounter (Signed)
Patient called stating that he needs to re-establish with Dr. Bronson Ing. He is stating that he cannot afford to take ENTRESTO 24-26 MG  .

## 2018-12-17 NOTE — Telephone Encounter (Signed)
Okay.  I will hold off on starting anything else until he is reevaluated in the office.

## 2018-12-17 NOTE — Telephone Encounter (Signed)
Patient informed and verbalized understanding of plan. 

## 2018-12-17 NOTE — Telephone Encounter (Signed)
Patient has appt with Dettinger

## 2018-12-25 ENCOUNTER — Telehealth: Payer: Self-pay

## 2018-12-25 NOTE — Telephone Encounter (Signed)
Unable to leave a message for patient to remind of missed remote transmission.  

## 2018-12-31 IMAGING — DX DG KNEE COMPLETE 4+V*R*
4 series · 4 of 4 positions shown · non-contrast
Comparison: None.

CLINICAL DATA: Twisting injury yesterday with medial knee pain,
initial encounter

EXAM:
RIGHT KNEE - COMPLETE 4+ VIEW

[knee ap]
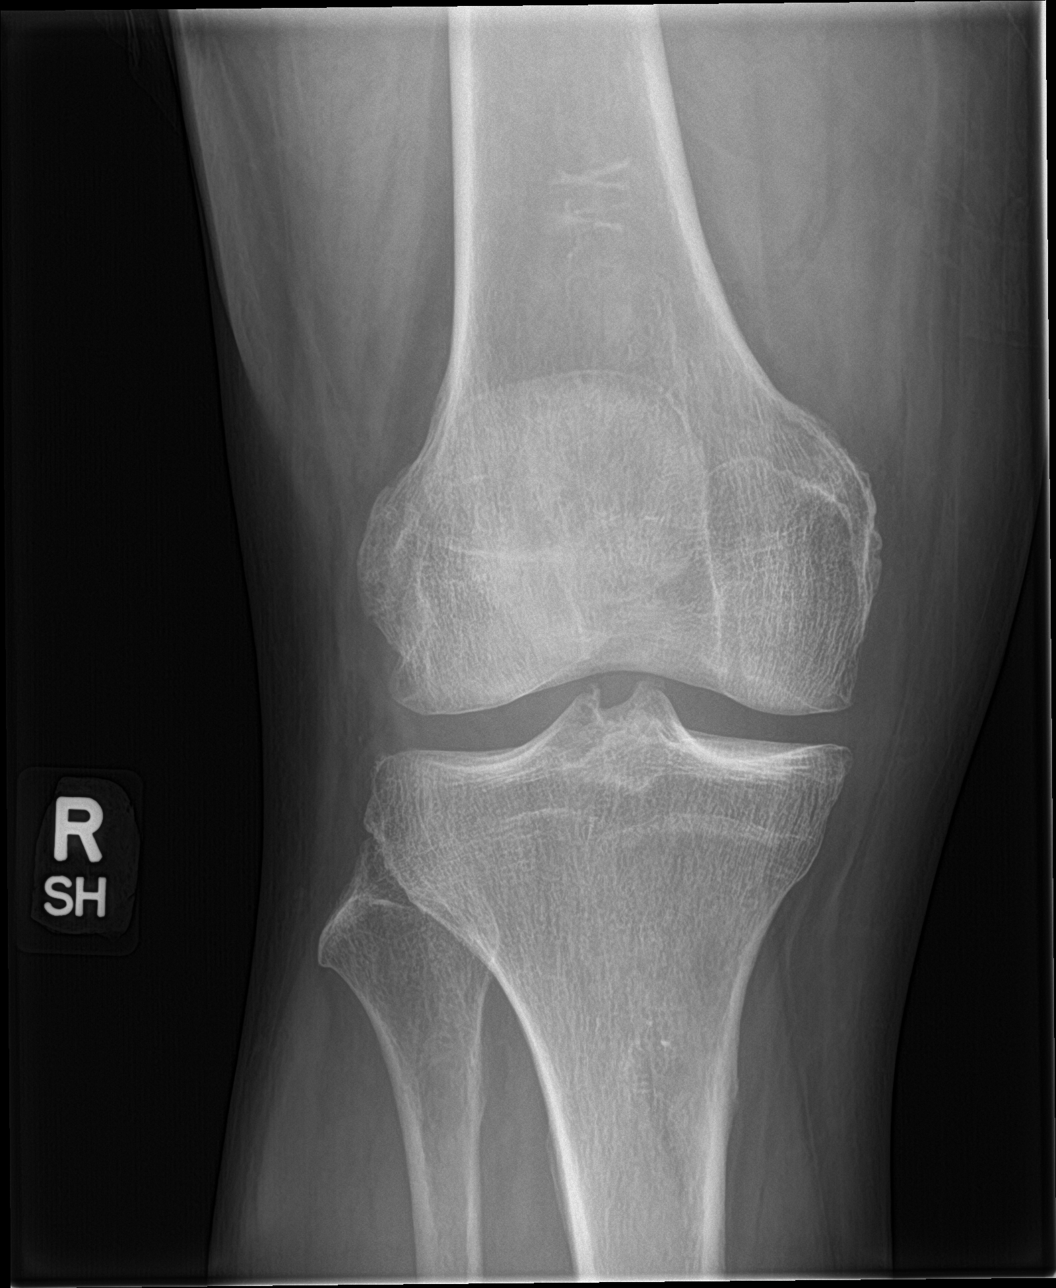

[knee lat]
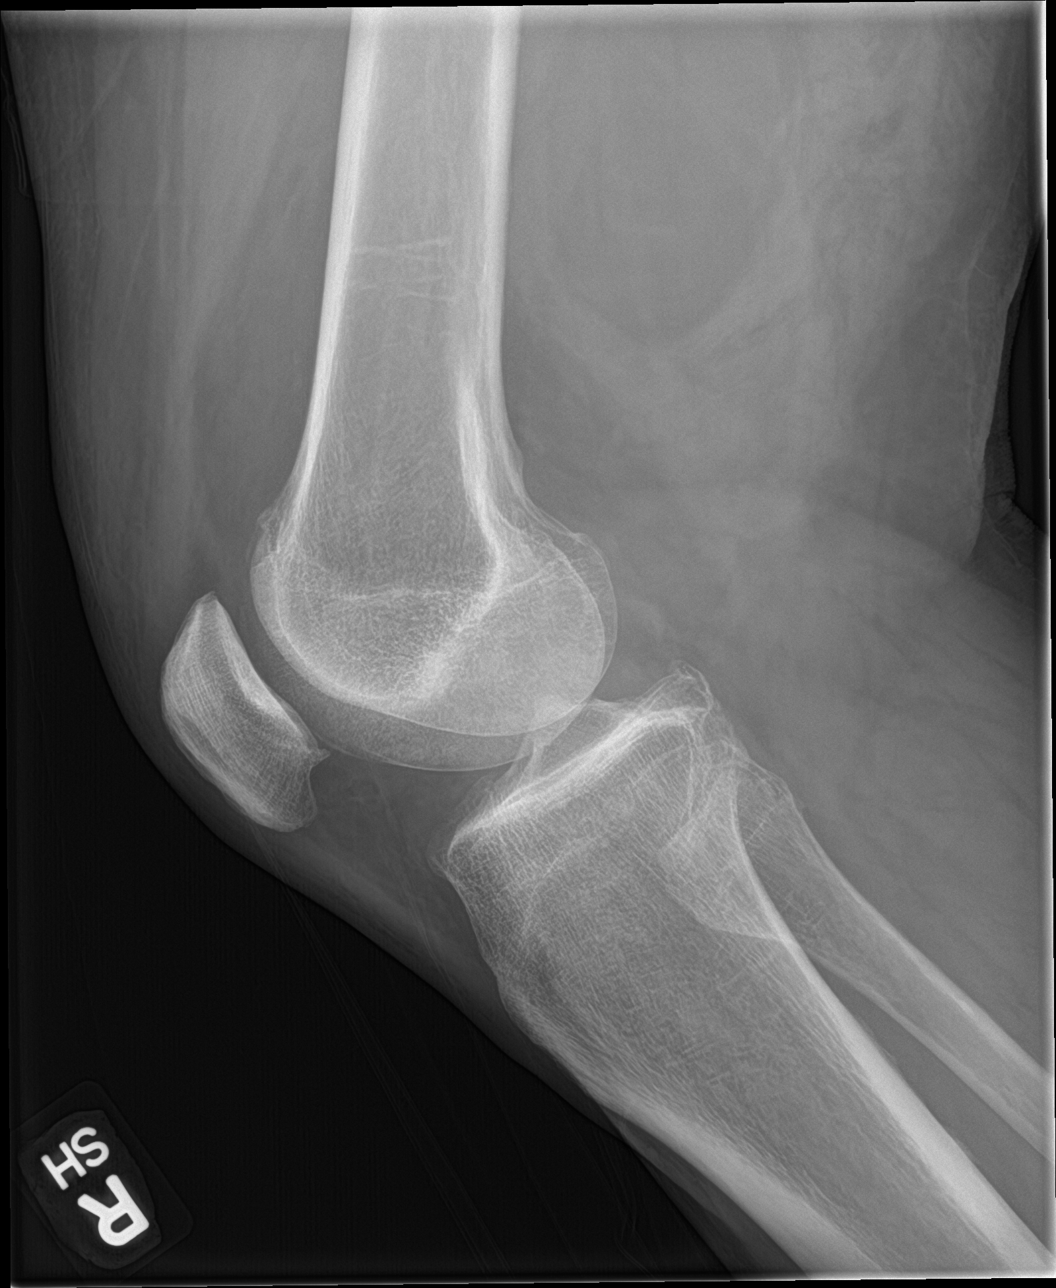

[knee obl (1 of 2)]
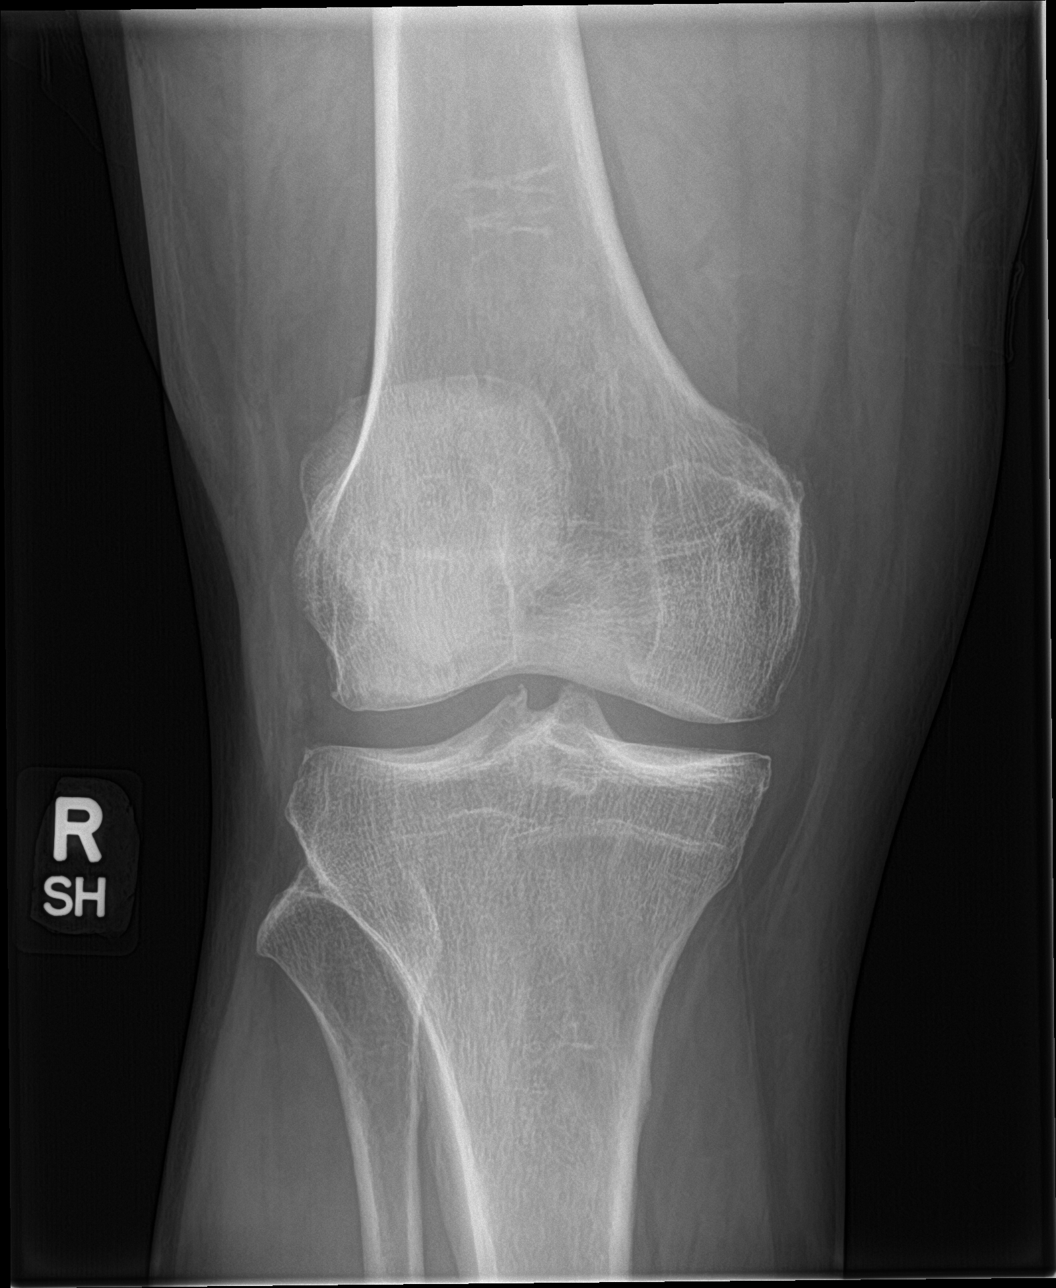

[knee obl (2 of 2)]
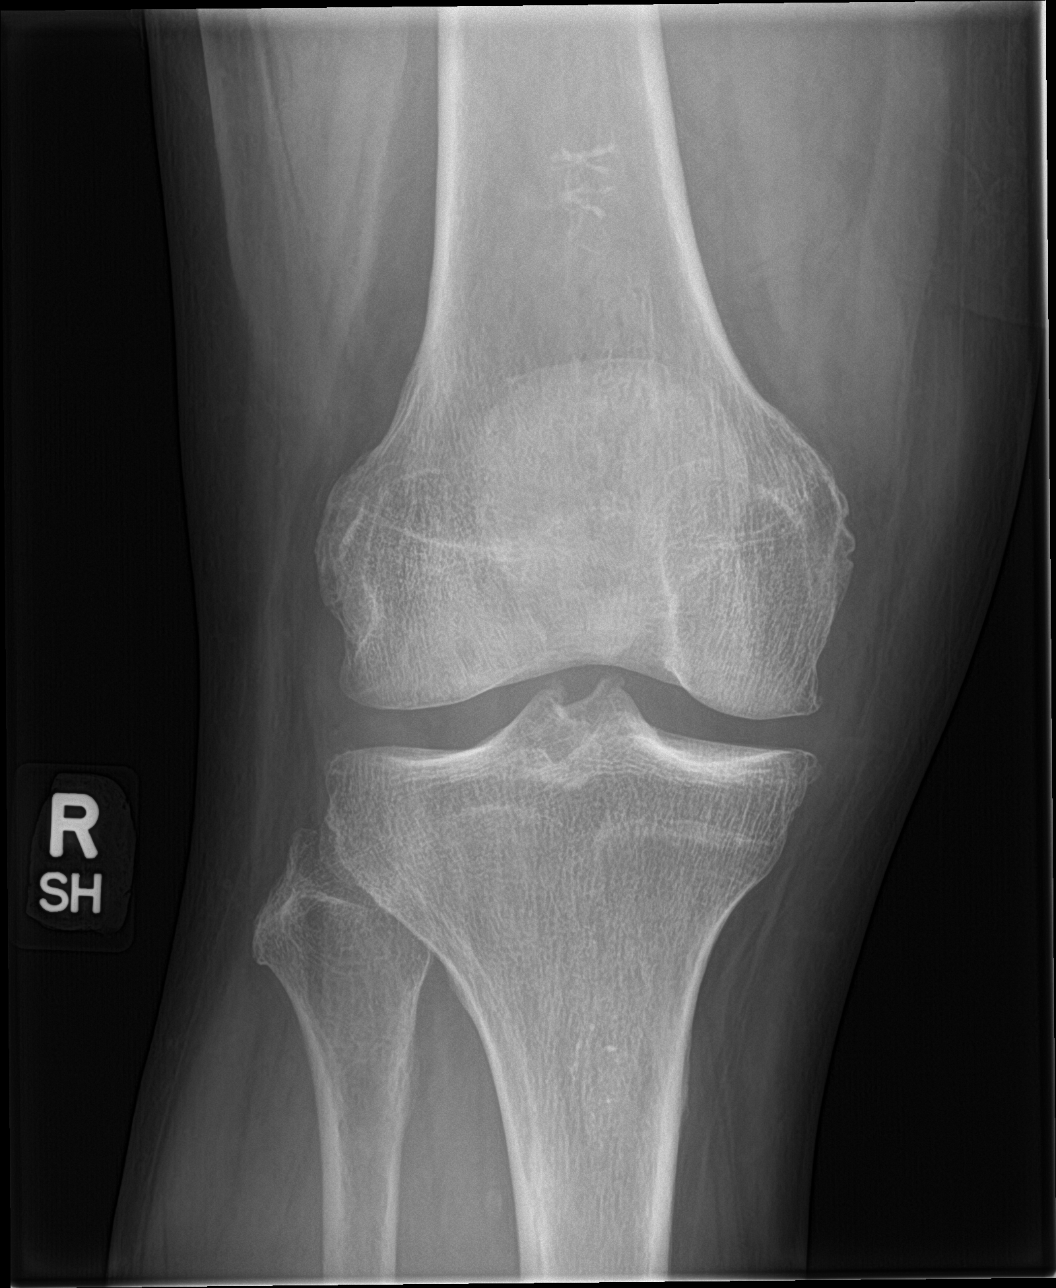

[4 of 4 positions shown; findings below may reference images not displayed]

FINDINGS: Mild degenerative changes are noted in the medial joint space and
patellofemoral space. No joint effusion is seen. No acute fracture
or dislocation is noted.
IMPRESSION: Mild degenerative change without acute abnormality.

## 2019-01-01 ENCOUNTER — Telehealth: Payer: Self-pay | Admitting: Family Medicine

## 2019-01-01 ENCOUNTER — Ambulatory Visit (INDEPENDENT_AMBULATORY_CARE_PROVIDER_SITE_OTHER): Payer: Medicare Other

## 2019-01-01 DIAGNOSIS — I5022 Chronic systolic (congestive) heart failure: Secondary | ICD-10-CM | POA: Diagnosis not present

## 2019-01-01 DIAGNOSIS — Z95810 Presence of automatic (implantable) cardiac defibrillator: Secondary | ICD-10-CM | POA: Diagnosis not present

## 2019-01-01 NOTE — Telephone Encounter (Signed)
Left message to call back  

## 2019-01-02 NOTE — Telephone Encounter (Signed)
Left message to call back  

## 2019-01-04 ENCOUNTER — Telehealth: Payer: Self-pay

## 2019-01-04 NOTE — Telephone Encounter (Signed)
Remote ICM transmission received.  Attempted call to patient regarding ICM remote transmission and left detailed message per DPR.  Advised to return call for any fluid symptoms or questions.  

## 2019-01-04 NOTE — Progress Notes (Signed)
EPIC Encounter for ICM Monitoring  Patient Name: Russell Werner is a 63 y.o. male Date: 01/04/2019 Primary Care Physican: Dettinger, Fransisca Kaufmann, MD Primary [place]   Attempted call to patient and unable to reach.  Left detailed message per DPR regarding transmission. Transmission reviewed.   CorVue thoracic impedancenormal but suggesting possible fluid accumulation 7/26 - 8/13 with exception of few days at baseline and 8/24 - 8/28.  No diuretic  Recommendations: Left voice mail with ICM number and encouraged to call if experiencing any fluid symptoms.  Follow-up plan: ICM clinic phone appointment on 02/11/2019.   Office appt 02/11/2019 with Dr. Bronson Ing.    Copy of ICM check sent to Dr. Rayann Heman.   3 month ICM trend: 12/29/2018    1 Year ICM trend:       Rosalene Billings, RN 01/04/2019 2:08 PM

## 2019-01-04 NOTE — Telephone Encounter (Signed)
Attempted to contact patient- no call back. This encounter will be closed. 

## 2019-01-10 DIAGNOSIS — R972 Elevated prostate specific antigen [PSA]: Secondary | ICD-10-CM | POA: Diagnosis not present

## 2019-01-10 DIAGNOSIS — R3915 Urgency of urination: Secondary | ICD-10-CM | POA: Diagnosis not present

## 2019-01-10 DIAGNOSIS — N5201 Erectile dysfunction due to arterial insufficiency: Secondary | ICD-10-CM | POA: Diagnosis not present

## 2019-01-14 ENCOUNTER — Ambulatory Visit (INDEPENDENT_AMBULATORY_CARE_PROVIDER_SITE_OTHER): Payer: Medicare HMO | Admitting: *Deleted

## 2019-01-14 DIAGNOSIS — I428 Other cardiomyopathies: Secondary | ICD-10-CM | POA: Diagnosis not present

## 2019-01-15 LAB — CUP PACEART REMOTE DEVICE CHECK
Battery Remaining Longevity: 91 mo
Battery Remaining Percentage: 83 %
Battery Voltage: 3.04 V
Brady Statistic RV Percent Paced: 1 %
Date Time Interrogation Session: 20200915030713
HighPow Impedance: 64 Ohm
HighPow Impedance: 64 Ohm
Implantable Lead Implant Date: 20190102
Implantable Lead Location: 753860
Implantable Pulse Generator Implant Date: 20190102
Lead Channel Impedance Value: 550 Ohm
Lead Channel Pacing Threshold Amplitude: 0.5 V
Lead Channel Pacing Threshold Pulse Width: 0.5 ms
Lead Channel Sensing Intrinsic Amplitude: 11.5 mV
Lead Channel Setting Pacing Amplitude: 2.5 V
Lead Channel Setting Pacing Pulse Width: 0.5 ms
Lead Channel Setting Sensing Sensitivity: 0.5 mV
Pulse Gen Serial Number: 9786935

## 2019-01-24 ENCOUNTER — Other Ambulatory Visit: Payer: Self-pay

## 2019-01-25 ENCOUNTER — Ambulatory Visit (INDEPENDENT_AMBULATORY_CARE_PROVIDER_SITE_OTHER): Payer: Medicare HMO | Admitting: Family Medicine

## 2019-01-25 ENCOUNTER — Encounter: Payer: Self-pay | Admitting: Cardiology

## 2019-01-25 ENCOUNTER — Encounter: Payer: Self-pay | Admitting: Family Medicine

## 2019-01-25 DIAGNOSIS — I5042 Chronic combined systolic (congestive) and diastolic (congestive) heart failure: Secondary | ICD-10-CM | POA: Diagnosis not present

## 2019-01-25 DIAGNOSIS — N401 Enlarged prostate with lower urinary tract symptoms: Secondary | ICD-10-CM

## 2019-01-25 DIAGNOSIS — I10 Essential (primary) hypertension: Secondary | ICD-10-CM

## 2019-01-25 DIAGNOSIS — N3281 Overactive bladder: Secondary | ICD-10-CM

## 2019-01-25 DIAGNOSIS — R3914 Feeling of incomplete bladder emptying: Secondary | ICD-10-CM | POA: Diagnosis not present

## 2019-01-25 DIAGNOSIS — I110 Hypertensive heart disease with heart failure: Secondary | ICD-10-CM | POA: Diagnosis not present

## 2019-01-25 LAB — CMP14+EGFR
ALT: 20 IU/L (ref 0–44)
AST: 14 IU/L (ref 0–40)
Albumin/Globulin Ratio: 2 (ref 1.2–2.2)
Albumin: 4.3 g/dL (ref 3.8–4.8)
Alkaline Phosphatase: 71 IU/L (ref 39–117)
BUN/Creatinine Ratio: 20 (ref 10–24)
BUN: 19 mg/dL (ref 8–27)
Bilirubin Total: 0.5 mg/dL (ref 0.0–1.2)
CO2: 25 mmol/L (ref 20–29)
Calcium: 9.2 mg/dL (ref 8.6–10.2)
Chloride: 104 mmol/L (ref 96–106)
Creatinine, Ser: 0.96 mg/dL (ref 0.76–1.27)
GFR calc Af Amer: 95 mL/min/{1.73_m2} (ref >59)
GFR calc non Af Amer: 83 mL/min/{1.73_m2} (ref >59)
Globulin, Total: 2.2 g/dL (ref 1.5–4.5)
Glucose: 110 mg/dL — ABNORMAL HIGH (ref 65–99)
Potassium: 4.6 mmol/L (ref 3.5–5.2)
Sodium: 142 mmol/L (ref 134–144)
Total Protein: 6.5 g/dL (ref 6.0–8.5)

## 2019-01-25 LAB — LIPID PANEL
Chol/HDL Ratio: 2.5 ratio (ref 0.0–5.0)
Cholesterol, Total: 147 mg/dL (ref 100–199)
HDL: 59 mg/dL (ref >39)
LDL Chol Calc (NIH): 63 mg/dL (ref 0–99)
Triglycerides: 147 mg/dL (ref 0–149)
VLDL Cholesterol Cal: 25 mg/dL (ref 5–40)

## 2019-01-25 LAB — CBC WITH DIFFERENTIAL/PLATELET
Basophils Absolute: 0.1 10*3/uL (ref 0.0–0.2)
Basos: 1 %
EOS (ABSOLUTE): 0.1 10*3/uL (ref 0.0–0.4)
Eos: 2 %
Hematocrit: 43.5 % (ref 37.5–51.0)
Hemoglobin: 14.5 g/dL (ref 13.0–17.7)
Immature Grans (Abs): 0 10*3/uL (ref 0.0–0.1)
Immature Granulocytes: 0 %
Lymphocytes Absolute: 1.9 10*3/uL (ref 0.7–3.1)
Lymphs: 28 %
MCH: 30.3 pg (ref 26.6–33.0)
MCHC: 33.3 g/dL (ref 31.5–35.7)
MCV: 91 fL (ref 79–97)
Monocytes Absolute: 0.4 10*3/uL (ref 0.1–0.9)
Monocytes: 6 %
Neutrophils Absolute: 4.5 10*3/uL (ref 1.4–7.0)
Neutrophils: 63 %
Platelets: 201 10*3/uL (ref 150–450)
RBC: 4.79 x10E6/uL (ref 4.14–5.80)
RDW: 12.4 % (ref 11.6–15.4)
WBC: 7 10*3/uL (ref 3.4–10.8)

## 2019-01-25 MED ORDER — MIRABEGRON ER 25 MG PO TB24: 25.0000 mg | ORAL_TABLET | Freq: Every day | ORAL | 3 refills | Status: DC

## 2019-01-25 MED ORDER — ENTRESTO 24-26 MG PO TABS
1.0000 | ORAL_TABLET | Freq: Two times a day (BID) | ORAL | 3 refills | Status: DC
Start: 2019-01-25 — End: 2020-03-31

## 2019-01-25 NOTE — Progress Notes (Signed)
Remote ICD transmission.   

## 2019-01-25 NOTE — Progress Notes (Signed)
BP 134/86   Pulse 65   Temp 98.2 F (36.8 C) (Temporal)   Resp 16   Ht _0  (1.778 m)   Wt 274 lb 9.6 oz (124.6 kg)   SpO2 95%   BMI 39.40 kg/m    Subjective:   Patient ID: Russell Werner, male    DOB: 1955/12/22, 63 y.o.   MRN: [id number]  HPI: Russell Werner is a 63 y.o. male presenting on 01/25/2019 for Hypertension (check up of chronic medical conditions)   HPI Hypertension and CHF Patient is currently on Entresto and bisoprolol and spironolactone and has been on them for some time.  He also has an AICD that is monitored closely by cardiology, and their blood pressure today is 134/86. Patient denies any lightheadedness or dizziness. Patient denies headaches, blurred vision, chest pains, shortness of breath, or weakness. Denies any side effects from medication and is content with current medication.  He has had some difficulties affording the Entresto at times and will try to give him the prescription assistance number to help.  He does take his weights daily.  BPH Patient is doing a lot better with his BPH since the TURP but the discernible Myrbetriq because he was having some leaking and overactive bladder and he says he has not been able to get it due to cost.  Patient also takes finasteride  Discussed weight and obesity patient plans to get back in the gym here now that the gyms are open again.  He recently came back from [place] as he had moved down there for a while and he is back now.  Relevant past medical, surgical, family and social history reviewed and updated as indicated. Interim medical history since our last visit reviewed. Allergies and medications reviewed and updated.  Review of Systems  Constitutional: Negative for chills, fever and unexpected weight change.  Eyes: Negative for visual disturbance.  Respiratory: Negative for shortness of breath and wheezing.   Cardiovascular: Negative for chest pain and leg swelling.  Gastrointestinal: Negative for  abdominal pain.  Genitourinary: Positive for frequency and urgency. Negative for difficulty urinating, flank pain, hematuria and testicular pain.  Musculoskeletal: Negative for back pain and gait problem.  Skin: Negative for rash.  Neurological: Negative for dizziness, weakness and light-headedness.  All other systems reviewed and are negative.   Per HPI unless specifically indicated above   Allergies as of 01/25/2019   No Known Allergies     Medication List       Accurate as of January 25, 2019  8:47 AM. If you have any questions, ask your nurse or doctor.        STOP taking these medications   Entresto 24-26 MG Generic drug: sacubitril-valsartan Stopped by: Worthy Rancher, MD   HYDROcodone-acetaminophen 5-325 MG tablet Commonly known as: Norco Stopped by: Worthy Rancher, MD   ondansetron 4 MG tablet Commonly known as: ZOFRAN Stopped by: Worthy Rancher, MD   senna-docusate 8.6-50 MG tablet Commonly known as: Senokot S Stopped by: Worthy Rancher, MD   sildenafil 20 MG tablet Commonly known as: REVATIO Stopped by: Worthy Rancher, MD   tamsulosin 0.4 MG Caps capsule Commonly known as: FLOMAX Stopped by: Fransisca Kaufmann Dettinger, MD     TAKE these medications   bisoprolol 5 MG tablet Commonly known as: ZEBETA TAKE 0.5 TABLETS (2.5 MG TOTAL) BY MOUTH DAILY.   calcium-vitamin D 500-200 MG-UNIT tablet Commonly known as: OSCAL WITH D Take 2 tablets by mouth  2 (two) times daily.   finasteride 5 MG tablet Commonly known as: PROSCAR Take 5 mg by mouth daily.   multivitamin capsule Take 1 capsule by mouth 2 (two) times daily.   rosuvastatin 20 MG tablet Commonly known as: CRESTOR TAKE 1 TABLET BY MOUTH EVERY DAY   spironolactone 25 MG tablet Commonly known as: ALDACTONE Take 1 tablet by mouth daily.        Objective:   BP 134/86   Pulse 65   Temp 98.2 F (36.8 C) (Temporal)   Resp 16   Ht _0  (1.778 m)   Wt 274 lb 9.6 oz  (124.6 kg)   SpO2 95%   BMI 39.40 kg/m   Wt Readings from Last 3 Encounters:  01/25/19 274 lb 9.6 oz (124.6 kg)  10/18/17 284 lb (128.8 kg)  10/12/17 284 lb (128.8 kg)    Physical Exam Vitals signs and nursing note reviewed.  Constitutional:      General: He is not in acute distress.    Appearance: He is well-developed. He is not diaphoretic.  Eyes:     General: No scleral icterus.    Conjunctiva/sclera: Conjunctivae normal.  Neck:     Musculoskeletal: Neck supple.     Thyroid: No thyromegaly.  Cardiovascular:     Rate and Rhythm: Normal rate and regular rhythm.     Heart sounds: Normal heart sounds. No murmur.  Pulmonary:     Effort: Pulmonary effort is normal. No respiratory distress.     Breath sounds: Normal breath sounds. No wheezing.  Musculoskeletal: Normal range of motion.        General: No swelling.  Lymphadenopathy:     Cervical: No cervical adenopathy.  Skin:    General: Skin is warm and dry.     Findings: No rash.  Neurological:     Mental Status: He is alert and oriented to person, place, and time.     Coordination: Coordination normal.  Psychiatric:        Behavior: Behavior normal.      Assessment & Plan:   Problem List Items Addressed This Visit      Cardiovascular and Mediastinum   CHF (congestive heart failure) (HCC)   Relevant Medications   spironolactone (ALDACTONE) 25 MG tablet   sacubitril-valsartan (ENTRESTO) 24-26 MG   Other Relevant Orders   CBC with Differential/Platelet   Hypertension   Relevant Medications   spironolactone (ALDACTONE) 25 MG tablet   sacubitril-valsartan (ENTRESTO) 24-26 MG   Other Relevant Orders   CMP14+EGFR     Genitourinary   BPH (benign prostatic hyperplasia)   Relevant Medications   finasteride (PROSCAR) 5 MG tablet   mirabegron ER (MYRBETRIQ) 25 MG TB24 tablet     Other   Morbid obesity (HCC) - Primary   Relevant Orders   Lipid panel    Other Visit Diagnoses    OAB (overactive bladder)        Relevant Medications   mirabegron ER (MYRBETRIQ) 25 MG TB24 tablet      Resume Entresto and spironolactone and will try and get him a prescription of Myrbetriq, continue finasteride and bisoprolol, also continue Crestor.  Weights have been stable and he will continue to monitor his weights at home. Follow up plan: Return in about 6 months (around 07/25/2019), or if symptoms worsen or fail to improve, for Hypertension and CHF.  Counseling provided for all of the vaccine components No orders of the defined types were placed in this encounter.   Vonna Kotyk  Dettinger, MD [facility] 01/25/2019, 8:47 AM

## 2019-02-11 ENCOUNTER — Encounter

## 2019-02-11 ENCOUNTER — Ambulatory Visit: Payer: Medicare HMO | Admitting: Cardiovascular Disease

## 2019-02-11 ENCOUNTER — Other Ambulatory Visit: Payer: Self-pay

## 2019-02-11 ENCOUNTER — Ambulatory Visit (INDEPENDENT_AMBULATORY_CARE_PROVIDER_SITE_OTHER): Payer: Medicare HMO

## 2019-02-11 ENCOUNTER — Encounter: Payer: Self-pay | Admitting: Cardiovascular Disease

## 2019-02-11 VITALS — BP 125/80 | HR 55 | Ht 70.0 in | Wt 284.4 lb

## 2019-02-11 DIAGNOSIS — G4733 Obstructive sleep apnea (adult) (pediatric): Secondary | ICD-10-CM

## 2019-02-11 DIAGNOSIS — Z95810 Presence of automatic (implantable) cardiac defibrillator: Secondary | ICD-10-CM

## 2019-02-11 DIAGNOSIS — I428 Other cardiomyopathies: Secondary | ICD-10-CM

## 2019-02-11 DIAGNOSIS — I5022 Chronic systolic (congestive) heart failure: Secondary | ICD-10-CM | POA: Diagnosis not present

## 2019-02-11 DIAGNOSIS — I10 Essential (primary) hypertension: Secondary | ICD-10-CM

## 2019-02-11 NOTE — Progress Notes (Signed)
EPIC Encounter for ICM Monitoring  Patient Name: Russell Werner is a 63 y.o. male Date: 02/11/2019 Primary Care Physican: Dettinger, Fransisca Kaufmann, MD Primary [place] Electrophysiologist:Allred 02/11/2019 [id number]   Spoke with patient.  He keeps track of foods and fluid through myfitness pal including amount of salt intake.  Advised to limit salt to 2000 mg daily.  He has been eating frozen pizza with extra pepperoni for the last few days.  He drinks a lot of fluids and advised to limit to 64 oz daily (fluids include beer, coffee and water).  CorVue thoracic impedancesuggesting possible ongoing fluid accumulation 02/08/2019.  No diuretic.    Labs: 01/25/2019 Creatinine 0.96, BUN 19, Potassium 4.6, Sodium 142, GFR 83-95 10/13/2018 Creatinine 0.75, BUN 17, Potassium 4.3, Sodium 144, GFR 97-112  A complete set of results can be found in Results Review.  Recommendations:  Advised sent Dr Bronson Ing update on fluid levels and recommendations can be given at that time.  Follow-up plan: ICM clinic phone appointment on 02/19/2019 to recheck fluid levels.   91 day device clinic remote transmission 04/15/2019.  Office appt 02/11/2019 with Dr. Bronson Ing.    Copy of ICM check sent to Dr. Rayann Heman and Dr Dwana Curd.   3 month ICM trend: 02/11/2019    1 Year ICM trend:       Rosalene Billings, RN 02/11/2019 10:35 AM

## 2019-02-11 NOTE — Progress Notes (Signed)
SUBJECTIVE: The patient presents for follow-up of chronic systolic heart failure.  I reviewed an encounter for CHF monitoring from earlier this morning.  It appears he has been eating frozen pizza with extra pepperoni for the last few days and drinking a lot of fluids, including beer, coffee and water.  Thoracic impedance suggest possible ongoing fluid accumulation since 02/08/2019.  He underwent implantation of a St. Jude's single-chamber ICD on 05/03/2017 by Dr. Rayann Heman.  He had been on Entresto up until 2 months ago and then ran out and could not afford anymore.  He is applying for assistance from the state to obtain coverage for this.  He told me he ate pizza on Friday and Saturday and added extra cheese.  He said he usually only eats pizza once per month.  Usually grilled vegetables and eats chicken.  He moved to [place], [place], from January of this year and moved back to within the past few months.  He followed a woman down there.  While he was there he followed with Dr. Salvadore Oxford who is with [facility].  At that time Delene Loll was increased to 49-51 mg twice daily.  He underwent an echocardiogram and stress test in March 2020 with results reviewed below.  He denies chest pain, orthopnea, shortness of breath, leg swelling and paroxysmal nocturnal dyspnea.    Review of Systems: As per "subjective", otherwise negative.  No Known Allergies  Current Outpatient Medications  Medication Sig Dispense Refill  . bisoprolol (ZEBETA) 5 MG tablet TAKE 0.5 TABLETS (2.5 MG TOTAL) BY MOUTH DAILY. 45 tablet 2  . calcium-vitamin D (OSCAL WITH D) 500-200 MG-UNIT tablet Take 2 tablets by mouth 2 (two) times daily.     . finasteride (PROSCAR) 5 MG tablet Take 5 mg by mouth daily.    . mirabegron ER (MYRBETRIQ) 25 MG TB24 tablet Take 1 tablet (25 mg total) by mouth daily. 90 tablet 3  . Multiple Vitamin (MULTIVITAMIN) capsule Take 1 capsule by mouth 2 (two) times daily.     .  rosuvastatin (CRESTOR) 20 MG tablet TAKE 1 TABLET BY MOUTH EVERY DAY 90 tablet 1  . spironolactone (ALDACTONE) 25 MG tablet Take 1 tablet by mouth daily.    . sacubitril-valsartan (ENTRESTO) 24-26 MG Take 1 tablet by mouth 2 (two) times daily. (Patient not taking: Reported on 02/11/2019) 180 tablet 3   No current facility-administered medications for this visit.     Past Medical History:  Diagnosis Date  . 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  . Arthritis    "fingers" (05/03/2017)  . CHF (congestive heart failure) ([place])   . COPD (chronic obstructive pulmonary disease) ([place])   . Coronary artery disease    nonobstructive mid RCA by 2009 cath at [facility] in [place]  . Enlarged prostate   . High cholesterol   . Hypertension   . Melanoma of back ([place]) 2013  . Meniscal injury   . Myocardial infarction ([place]) 1998   mild  . OSA on CPAP    "severe" (05/03/2017)  . Right knee meniscal tear     Past Surgical History:  Procedure Laterality Date  . CARDIAC CATHETERIZATION  X 2  . CARDIAC DEFIBRILLATOR PLACEMENT  05/03/2017  . CYST REMOVAL LEG Bilateral 2013  . HERNIA REPAIR    . ICD IMPLANT N/A 05/03/2017   Procedure: ICD IMPLANT;  Surgeon: Thompson Grayer, MD;  Location: [place] [facility];  Service: Cardiovascular;  Laterality: N/A;  . KNEE ARTHROSCOPY WITH  MEDIAL MENISECTOMY Right 10/18/2017   Procedure: RIGHT KNEE ARTHROSCOPY WITH PARTIAL MEDIAL MENISCECTOMY;  Surgeon: Leandrew Koyanagi, MD;  Location: [facility];  Service: Orthopedics;  Laterality: Right;  . KNEE CARTILAGE SURGERY Left 1972  . LAPAROSCOPIC CHOLECYSTECTOMY  2015  . LAPAROSCOPIC GASTRIC BAND REMOVAL WITH LAPAROSCOPIC GASTRIC SLEEVE RESECTION  11/05/2014  . MELANOMA EXCISION  2013   "back"  . UMBILICAL HERNIA REPAIR  2014    Social History   Socioeconomic History  . Marital status: Divorced    Spouse name: Not on file  . Number of children: Not on file  . Years of education: Not on file   . Highest education level: Not on file  Occupational History  . Not on file  Social Needs  . Financial resource strain: Not on file  . Food insecurity    Worry: Not on file    Inability: Not on file  . Transportation needs    Medical: Not on file    Non-medical: Not on file  Tobacco Use  . Smoking status: Former Smoker    Packs/day: 1.00    Years: 47.00    Pack years: 47.00    Types: Cigarettes    Start date: 06/24/1967    Quit date: 05/02/2013    Years since quitting: 5.7  . Smokeless tobacco: Never Used  Substance and Sexual Activity  . Alcohol use: Yes    Comment:  "1-2 glasses of wine/month"  . Drug use: No  . Sexual activity: Yes  Lifestyle  . Physical activity    Days per week: Not on file    Minutes per session: Not on file  . Stress: Not on file  Relationships  . Social Herbalist on phone: Not on file    Gets together: Not on file    Attends religious service: Not on file    Active member of club or organization: Not on file    Attends meetings of clubs or organizations: Not on file    Relationship status: Not on file  . Intimate partner violence    Fear of current or ex partner: Not on file    Emotionally abused: Not on file    Physically abused: Not on file    Forced sexual activity: Not on file  Other Topics Concern  . Not on file  Social History Narrative  . Not on file     Vitals:   02/11/19 1538  BP: 125/80  Pulse: (!) 55  SpO2: 96%  Weight: 284 lb 6.4 oz (129 kg)  Height: 5\' 10"  (1.778 m)    Wt Readings from Last 3 Encounters:  02/11/19 284 lb 6.4 oz (129 kg)  01/25/19 274 lb 9.6 oz (124.6 kg)  10/18/17 284 lb (128.8 kg)     PHYSICAL EXAM General: NAD HEENT: Normal. Neck: No JVD, no thyromegaly. Lungs: Clear to auscultation bilaterally with normal respiratory effort. CV: Regular rate and rhythm, normal S1/S2, no S3/S4, no murmur. No pretibial or periankle edema.  No carotid bruit.   Abdomen: Soft, nontender, no  distention.  Neurologic: Alert and oriented.  Psych: Normal affect. Skin: Normal. Musculoskeletal: No gross deformities.      Labs: Lab Results  Component Value Date/Time   K 4.6 01/25/2019 09:09 AM   BUN 19 01/25/2019 09:09 AM   CREATININE 0.96 01/25/2019 09:09 AM   ALT 20 01/25/2019 09:09 AM   TSH 2.330 11/22/2016 12:00 AM   HGB 14.5 01/25/2019 09:09 AM  Lipids: Lab Results  Component Value Date/Time   LDLCALC 63 01/25/2019 09:09 AM   CHOL 147 01/25/2019 09:09 AM   TRIG 147 01/25/2019 09:09 AM   HDL 59 01/25/2019 09:09 AM      Echocardiogram 07/16/2018:   Eccentric left ventricular hypertrophy.  Moderate left ventricular enlargement.  Left ventricular systolic function. The estimated ejection fraction is  25%.  Grade I (mild) left ventricular diastolic dysfunction consistent with  impaired relaxation.  Severe global hypokinetic wall motion.  Moderately to severely reduced right ventricular systolic function.  Mild right atrial enlargement.  Normal central venous pressure (3 mmHg).  The estimated PA systolic pressure is 20 mmHg.  Small pericardial effusion.  Moderate left atrial enlargement.  Difficult windows, Lumason contrast used for wall motion analysis.   Nuclear stress test 07/16/2018:  . There is a small to moderate sized, moderate intensity, mostly fixed  defect with some reversibilty in the basal to apical inferior wall(s) in  the typical distribution of the RCA territory. . There is a small sized, moderate intensity, fixed defect consistent  with scar in the basal to distal septal wall(s) in the typical  distribution of the LAD territory. . Gated perfusion images showed an ejection fraction of 25% at rest and  23% post stress. Normal ejection fraction is greater than 53%. . There is severe global hypokinesis at rest and stress. . There is basal to apical septal wall akinesis at rest and stress. . LV cavity size is severely  enlarged at rest and severely enlarged at  stress. . The EKG portion of this study is negative for ischemia. . The patient reported no chest pain during the stress test. . The blood pressure response to stress was normal. . Arrhythmias during stress: occasional PACs. . Finding suggest multivessel disease with minimal ischemia in the  inferior wall.   ASSESSMENT AND PLAN: 1. Cardiomyopathy/chronic systolic heart failure: He is on bisoprolol 2.5 mg daily and spironolactone 25 mg daily. He could not afford Entresto and ran out about 2 months ago but has applied for assistance from the state.  He has an ICD. Thoracic impedance suggests fluid accumulation.  He has been noncompliant with a low-sodium diet and fluid intake.  He was again educated about this earlier today. He is asymptomatic.  He does not need a diuretic at this time.  2. Hypertension:Controlled on present therapy. No changes.  3. Severe sleep apnea:Uses CPAP.  4.  ICD: No shocks from device.    Disposition: Follow up 3 months   Kate Sable, M.D., F.A.C.C.

## 2019-02-11 NOTE — Patient Instructions (Signed)
Medication Instructions:  Continue all current medications.  Labwork: none  Testing/Procedures: none  Follow-Up: 3-4 months   Any Other Special Instructions Will Be Listed Below (If Applicable).  If you need a refill on your cardiac medications before your next appointment, please call your pharmacy.  

## 2019-02-12 ENCOUNTER — Encounter: Payer: Self-pay | Admitting: *Deleted

## 2019-02-19 ENCOUNTER — Ambulatory Visit (INDEPENDENT_AMBULATORY_CARE_PROVIDER_SITE_OTHER): Payer: Medicare HMO

## 2019-02-19 DIAGNOSIS — Z95810 Presence of automatic (implantable) cardiac defibrillator: Secondary | ICD-10-CM

## 2019-02-19 DIAGNOSIS — I5022 Chronic systolic (congestive) heart failure: Secondary | ICD-10-CM

## 2019-02-20 ENCOUNTER — Telehealth: Payer: Self-pay

## 2019-02-20 NOTE — Telephone Encounter (Signed)
Left message for patient to remind of missed remote transmission.  

## 2019-02-22 NOTE — Progress Notes (Signed)
EPIC Encounter for ICM Monitoring  Patient Name: Russell Werner is a 63 y.o. male Date: 02/22/2019 Primary Care Physican: Dettinger, Fransisca Kaufmann, MD Primary [place] Electrophysiologist:Allred 02/11/2019 [id number]   Transmission reviewed.    CorVue thoracic impedance returned to normal.  No diuretic.    Labs: 01/25/2019 Creatinine 0.96, BUN 19, Potassium 4.6, Sodium 142, GFR 83-95 10/13/2018 Creatinine 0.75, BUN 17, Potassium 4.3, Sodium 144, GFR 97-112  A complete set of results can be found in Results Review.  Recommendations: None  Follow-up plan: ICM clinic phone appointment on 03/25/2019.   91 day device clinic remote transmission 04/15/2019.    Copy of ICM check sent to Dr. Rayann Heman.   3 month ICM trend: 02/19/2019    1 Year ICM trend:       Rosalene Billings, RN 02/22/2019 3:12 PM

## 2019-03-14 ENCOUNTER — Other Ambulatory Visit: Payer: Self-pay | Admitting: *Deleted

## 2019-03-14 MED ORDER — BISOPROLOL FUMARATE 5 MG PO TABS: 2.5000 mg | ORAL_TABLET | Freq: Every day | ORAL | 2 refills | Status: DC

## 2019-03-14 MED ORDER — SPIRONOLACTONE 25 MG PO TABS: 25.0000 mg | ORAL_TABLET | Freq: Every day | ORAL | 11 refills | Status: DC

## 2019-03-14 MED ORDER — ROSUVASTATIN CALCIUM 20 MG PO TABS: 20.0000 mg | ORAL_TABLET | Freq: Every day | ORAL | 1 refills | Status: DC

## 2019-03-14 MED ORDER — FINASTERIDE 5 MG PO TABS: 5.0000 mg | ORAL_TABLET | Freq: Every day | ORAL | 0 refills | Status: DC

## 2019-03-25 ENCOUNTER — Ambulatory Visit (INDEPENDENT_AMBULATORY_CARE_PROVIDER_SITE_OTHER): Payer: Medicare HMO

## 2019-03-25 DIAGNOSIS — I5022 Chronic systolic (congestive) heart failure: Secondary | ICD-10-CM | POA: Diagnosis not present

## 2019-03-25 DIAGNOSIS — Z95810 Presence of automatic (implantable) cardiac defibrillator: Secondary | ICD-10-CM

## 2019-03-27 NOTE — Progress Notes (Signed)
EPIC Encounter for ICM Monitoring  Patient Name: Russell Werner is a 63 y.o. male Date: 03/27/2019 Primary Care Physican: Dettinger, Fransisca Kaufmann, MD Primary [place] Electrophysiologist:Allred 02/11/2019 [id number]   Transmission reviewed.    CorVue thoracic impedance normal.  No diuretic.  Labs: 01/25/2019 Creatinine0.96Carmelina Paddock, Potassium4.6, [id number], [id number] 10/13/2018 Creatinine0.75, BUN17, Potassium4.3, [id number], [id number] A complete set of results can be found in Results Review.  Recommendations:  None  Follow-up plan: ICM clinic phone appointment on 05/13/2019.   91 day device clinic remote transmission 04/15/2019.  Office visit with Dr Bronson Ing 05/17/2019.  Copy of ICM check sent to Dr. Rayann Heman.   3 month ICM trend: 03/25/2019    1 Year ICM trend:       Rosalene Billings, RN 03/27/2019 12:45 PM

## 2019-04-15 ENCOUNTER — Ambulatory Visit (INDEPENDENT_AMBULATORY_CARE_PROVIDER_SITE_OTHER): Payer: Medicare HMO | Admitting: *Deleted

## 2019-04-15 ENCOUNTER — Telehealth: Payer: Self-pay | Admitting: *Deleted

## 2019-04-15 DIAGNOSIS — I5042 Chronic combined systolic (congestive) and diastolic (congestive) heart failure: Secondary | ICD-10-CM | POA: Diagnosis not present

## 2019-04-15 LAB — CUP PACEART REMOTE DEVICE CHECK
Battery Remaining Longevity: 90 mo
Battery Remaining Percentage: 82 %
Battery Voltage: 3.02 V
Brady Statistic RV Percent Paced: 1 %
Date Time Interrogation Session: 20201214044010
HighPow Impedance: 64 Ohm
HighPow Impedance: 64 Ohm
Implantable Lead Implant Date: 20190102
Implantable Lead Location: 753860
Implantable Pulse Generator Implant Date: 20190102
Lead Channel Impedance Value: 580 Ohm
Lead Channel Pacing Threshold Amplitude: 0.5 V
Lead Channel Pacing Threshold Pulse Width: 0.5 ms
Lead Channel Sensing Intrinsic Amplitude: 11.5 mV
Lead Channel Setting Pacing Amplitude: 2.5 V
Lead Channel Setting Pacing Pulse Width: 0.5 ms
Lead Channel Setting Sensing Sensitivity: 0.5 mV
Pulse Gen Serial Number: 9786935

## 2019-04-15 NOTE — Telephone Encounter (Signed)
Spoke with patient regarding "NSVT" episode noted on scheduled transmission, event occurred 04/03/19 at 10:46, 4sec reported duration. EGM appears SVT, irregular R-R intervals, morphology template match, avg V rate 205bpm. Patient reports he was hunting in [place], likely getting out of a tree stand at that time. Asymptomatic with episode. Advised Dr. Rayann Heman will review transmission, we will call if any new recommendations.  Pt asked about his fluid status. Explained that he recently appeared to be holding onto fluid x12 days. He denies any symptoms, but reports he did gain some weight when he returned home from his trip. Pt is followed in [facility]. Routed to Sharman Cheek, Therapist, sports, as Juluis Rainier.

## 2019-04-17 NOTE — Telephone Encounter (Signed)
Dr. Rayann Heman reviewed 04/15/19 transmission--no recommended changes at this time per result note. Patient is scheduled to see Dr. Rayann Heman in [place] on 05/10/19.

## 2019-05-10 ENCOUNTER — Encounter: Payer: Medicare HMO | Admitting: Internal Medicine

## 2019-05-13 ENCOUNTER — Ambulatory Visit (INDEPENDENT_AMBULATORY_CARE_PROVIDER_SITE_OTHER): Payer: Medicare HMO

## 2019-05-13 DIAGNOSIS — I5022 Chronic systolic (congestive) heart failure: Secondary | ICD-10-CM

## 2019-05-13 DIAGNOSIS — Z95810 Presence of automatic (implantable) cardiac defibrillator: Secondary | ICD-10-CM | POA: Diagnosis not present

## 2019-05-17 ENCOUNTER — Encounter: Payer: Self-pay | Admitting: Cardiovascular Disease

## 2019-05-17 ENCOUNTER — Ambulatory Visit (INDEPENDENT_AMBULATORY_CARE_PROVIDER_SITE_OTHER): Payer: Medicare HMO | Admitting: Cardiovascular Disease

## 2019-05-17 ENCOUNTER — Other Ambulatory Visit: Payer: Self-pay

## 2019-05-17 VITALS — BP 114/78 | HR 76 | Ht 70.0 in | Wt 300.8 lb

## 2019-05-17 DIAGNOSIS — I10 Essential (primary) hypertension: Secondary | ICD-10-CM

## 2019-05-17 DIAGNOSIS — I5022 Chronic systolic (congestive) heart failure: Secondary | ICD-10-CM

## 2019-05-17 DIAGNOSIS — G4733 Obstructive sleep apnea (adult) (pediatric): Secondary | ICD-10-CM

## 2019-05-17 DIAGNOSIS — Z95810 Presence of automatic (implantable) cardiac defibrillator: Secondary | ICD-10-CM | POA: Diagnosis not present

## 2019-05-17 NOTE — Progress Notes (Signed)
SUBJECTIVE: Patient presents for follow-up of chronic systolic heart failure.  He underwent implantation of a St. Jude's single-chamber ICD on 05/03/2017 by Dr. Rayann Heman.  Thoracic impedance was normal on 03/25/2019.  He had a very brief episode of asymptomatic SVT.  He is followed by EP.  He told me the episode of SVT most of correlated during a time that he was deer hunting in [place].  He said he ate a lot of junk food on that trip and has put on a lot of weight.  He denies chest pain, palpitations, leg swelling, orthopnea, and shortness of breath.   Review of Systems: As per "subjective", otherwise negative.  No Known Allergies  Current Outpatient Medications  Medication Sig Dispense Refill  . Ascorbic Acid (VITAMIN C) 1000 MG tablet Take 2,000 mg by mouth daily.    Marland Kitchen aspirin EC 81 MG tablet Take 81 mg by mouth daily.    . bisoprolol (ZEBETA) 5 MG tablet Take 0.5 tablets (2.5 mg total) by mouth daily. 45 tablet 2  . finasteride (PROSCAR) 5 MG tablet Take 1 tablet (5 mg total) by mouth daily. 90 tablet 0  . Multiple Vitamin (MULTIVITAMIN) capsule Take 1 capsule by mouth 2 (two) times daily.     Marland Kitchen MYRBETRIQ 50 MG TB24 tablet Take 1 tablet by mouth daily.    . rosuvastatin (CRESTOR) 20 MG tablet Take 1 tablet (20 mg total) by mouth daily. 90 tablet 1  . sacubitril-valsartan (ENTRESTO) 24-26 MG Take 1 tablet by mouth 2 (two) times daily. 180 tablet 3  . spironolactone (ALDACTONE) 25 MG tablet Take 1 tablet (25 mg total) by mouth daily. 30 tablet 11  . vitamin B-12 (CYANOCOBALAMIN) 500 MCG tablet Take 500 mcg by mouth daily.     No current facility-administered medications for this visit.    Past Medical History:  Diagnosis Date  . 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  . Arthritis    "fingers" (05/03/2017)  . CHF (congestive heart failure) ([place])   . COPD (chronic obstructive pulmonary disease) ([place])   . Coronary artery disease    nonobstructive mid  RCA by 2009 cath at [facility] in [place]  . Enlarged prostate   . High cholesterol   . Hypertension   . Melanoma of back ([place]) 2013  . Meniscal injury   . Myocardial infarction ([place]) 1998   mild  . OSA on CPAP    "severe" (05/03/2017)  . Right knee meniscal tear     Past Surgical History:  Procedure Laterality Date  . CARDIAC CATHETERIZATION  X 2  . CARDIAC DEFIBRILLATOR PLACEMENT  05/03/2017  . CYST REMOVAL LEG Bilateral 2013  . HERNIA REPAIR    . ICD IMPLANT N/A 05/03/2017   Procedure: ICD IMPLANT;  Surgeon: Thompson Grayer, MD;  Location: [facility];  Service: Cardiovascular;  Laterality: N/A;  . KNEE ARTHROSCOPY WITH MEDIAL MENISECTOMY Right 10/18/2017   Procedure: RIGHT KNEE ARTHROSCOPY WITH PARTIAL MEDIAL MENISCECTOMY;  Surgeon: Leandrew Koyanagi, MD;  Location: [facility];  Service: Orthopedics;  Laterality: Right;  . KNEE CARTILAGE SURGERY Left 1972  . LAPAROSCOPIC CHOLECYSTECTOMY  2015  . LAPAROSCOPIC GASTRIC BAND REMOVAL WITH LAPAROSCOPIC GASTRIC SLEEVE RESECTION  11/05/2014  . MELANOMA EXCISION  2013   "back"  . UMBILICAL HERNIA REPAIR  2014    Social History   Socioeconomic History  . Marital status: Divorced    Spouse name: Not on file  . Number of children: Not on file  .  Years of education: Not on file  . Highest education level: Not on file  Occupational History  . Not on file  Tobacco Use  . Smoking status: Former Smoker    Packs/day: 1.00    Years: 47.00    Pack years: 47.00    Types: Cigarettes    Start date: 06/24/1967    Quit date: 05/02/2013    Years since quitting: 6.0  . Smokeless tobacco: Never Used  Substance and Sexual Activity  . Alcohol use: Yes    Comment:  "1-2 glasses of wine/month"  . Drug use: No  . Sexual activity: Yes  Other Topics Concern  . Not on file  Social History Narrative  . Not on file   Social Determinants of Health   Financial Resource Strain:   . Difficulty of Paying Living Expenses: Not on file  Food  Insecurity:   . Worried About Charity fundraiser in the Last Year: Not on file  . Ran Out of Food in the Last Year: Not on file  Transportation Needs:   . Lack of Transportation (Medical): Not on file  . Lack of Transportation (Non-Medical): Not on file  Physical Activity:   . Days of Exercise per Week: Not on file  . Minutes of Exercise per Session: Not on file  Stress:   . Feeling of Stress : Not on file  Social Connections:   . Frequency of Communication with Friends and Family: Not on file  . Frequency of Social Gatherings with Friends and Family: Not on file  . Attends Religious Services: Not on file  . Active Member of Clubs or Organizations: Not on file  . Attends Archivist Meetings: Not on file  . Marital Status: Not on file  Intimate Partner Violence:   . Fear of Current or Ex-Partner: Not on file  . Emotionally Abused: Not on file  . Physically Abused: Not on file  . Sexually Abused: Not on file     Vitals:   05/17/19 0841  BP: 114/78  Pulse: 76  SpO2: 95%  Weight: (!) 300 lb 12.8 oz (136.4 kg)  Height: 5\' 10"  (1.778 m)    Wt Readings from Last 3 Encounters:  05/17/19 (!) 300 lb 12.8 oz (136.4 kg)  02/11/19 284 lb 6.4 oz (129 kg)  01/25/19 274 lb 9.6 oz (124.6 kg)     PHYSICAL EXAM General: NAD, morbidly obese HEENT: Normal. Neck: No JVD, no thyromegaly. Lungs: Clear to auscultation bilaterally with normal respiratory effort. CV: Regular rate and rhythm, normal S1/S2, no S3/S4, no murmur. No pretibial or periankle edema.     Abdomen: Soft, nontender, no distention.  Neurologic: Alert and oriented.  Psych: Normal affect. Skin: Normal. Musculoskeletal: No gross deformities.      Labs: Lab Results  Component Value Date/Time   K 4.6 01/25/2019 09:09 AM   BUN 19 01/25/2019 09:09 AM   CREATININE 0.96 01/25/2019 09:09 AM   ALT 20 01/25/2019 09:09 AM   TSH 2.330 11/22/2016 12:00 AM   HGB 14.5 01/25/2019 09:09 AM     Lipids: Lab  Results  Component Value Date/Time   LDLCALC 63 01/25/2019 09:09 AM   CHOL 147 01/25/2019 09:09 AM   TRIG 147 01/25/2019 09:09 AM   HDL 59 01/25/2019 09:09 AM      Echocardiogram 07/16/2018:   Eccentric left ventricular hypertrophy.  Moderate left ventricular enlargement.  Left ventricular systolic function. The estimated ejection fraction is  25%.  Grade I (mild) left  ventricular diastolic dysfunction consistent with  impaired relaxation.  Severe global hypokinetic wall motion.  Moderately to severely reduced right ventricular systolic function.  Mild right atrial enlargement.  Normal central venous pressure (3 mmHg).  The estimated PA systolic pressure is 20 mmHg.  Small pericardial effusion.  Moderate left atrial enlargement.  Difficult windows, Lumason contrast used for wall motion analysis.   Nuclear stress test 07/16/2018:  . There is a small to moderate sized, moderate intensity, mostly fixed  defect with some reversibilty in the basal to apical inferior wall(s) in  the typical distribution of the RCA territory. . There is a small sized, moderate intensity, fixed defect consistent  with scar in the basal to distal septal wall(s) in the typical  distribution of the LAD territory. . Gated perfusion images showed an ejection fraction of 25% at rest and  23% post stress. Normal ejection fraction is greater than 53%. . There is severe global hypokinesis at rest and stress. . There is basal to apical septal wall akinesis at rest and stress. . LV cavity size is severely enlarged at rest and severely enlarged at  stress. . The EKG portion of this study is negative for ischemia. . The patient reported no chest pain during the stress test. . The blood pressure response to stress was normal. . Arrhythmias during stress: occasional PACs. . Finding suggest multivessel disease with minimal ischemia in the  inferior wall.   ASSESSMENT AND PLAN: 1.   Chronic systolic heart failure: Currently on bisoprolol 2.5 mg daily, Entresto 24-26 mg twice daily, and and spironolactone 25 mg daily.  He has a defibrillator.  Thoracic impedance was normal in late November 2020.  He has NYHA class II symptoms.  No changes to therapy.  2.  Hypertension: Blood pressure is normal.  No changes.  3.  Severe sleep apnea: Uses CPAP.  4.  ICD: No shocks from device.  5.  Morbid obesity: He needs significant weight loss.    Disposition: Follow up 6 months   Kate Sable, M.D., F.A.C.C.

## 2019-05-17 NOTE — Progress Notes (Signed)
EPIC Encounter for ICM Monitoring  Patient Name: Russell Werner is a 64 y.o. male Date: 05/17/2019 Primary Care Physican: Dettinger, Fransisca Kaufmann, MD Primary [place] Electrophysiologist:Allred 1/15/2021Weight300lbs(office weight)   Transmission reviewed.  CorVue thoracic impedancenormal.  No diuretic.  Labs: 01/25/2019 Creatinine0.96Carmelina Paddock, Potassium4.6, [id number], [id number] 10/13/2018 Creatinine0.75, BUN17, Potassium4.3, [id number], [id number] A complete set of results can be found in Results Review.  Recommendations: None  Follow-up plan: ICM clinic phone appointment on 06/17/2019.   91 day device clinic remote transmission 07/15/2019.  Office appt 08/16/2019 with Dr. Rayann Heman.    Copy of ICM check sent to Dr. Rayann Heman.   3 month ICM trend: 05/13/2019    1 Year ICM trend:       Rosalene Billings, RN 05/17/2019 1:38 PM

## 2019-05-17 NOTE — Patient Instructions (Signed)

## 2019-05-19 NOTE — Progress Notes (Signed)
ICD remote 

## 2019-05-24 ENCOUNTER — Ambulatory Visit: Payer: Medicare HMO | Admitting: Cardiovascular Disease

## 2019-05-27 ENCOUNTER — Other Ambulatory Visit: Payer: Self-pay | Admitting: Family Medicine

## 2019-07-01 ENCOUNTER — Ambulatory Visit (INDEPENDENT_AMBULATORY_CARE_PROVIDER_SITE_OTHER): Payer: Medicare HMO

## 2019-07-01 ENCOUNTER — Telehealth: Payer: Self-pay

## 2019-07-01 DIAGNOSIS — I5022 Chronic systolic (congestive) heart failure: Secondary | ICD-10-CM | POA: Diagnosis not present

## 2019-07-01 DIAGNOSIS — Z95810 Presence of automatic (implantable) cardiac defibrillator: Secondary | ICD-10-CM | POA: Diagnosis not present

## 2019-07-01 NOTE — Telephone Encounter (Signed)
Returned call to patient as requested by voice mail message.  He wanted to check if the last remote transmission was received. Advised last report was received on 06/24/2019.  He is feeling fine.

## 2019-07-01 NOTE — Progress Notes (Signed)
EPIC Encounter for ICM Monitoring  Patient Name: Russell Werner is a 64 y.o. male Date: 07/01/2019 Primary Care Physican: Dettinger, Fransisca Kaufmann, MD Primary [place] Electrophysiologist:Allred 05/17/2019 Office Weight300lbs   Spoke with patient and reports feeling well at this time.  Denies fluid symptoms.    CorVue thoracic impedancenormal.  No diuretic.  Labs: 01/25/2019 Creatinine0.96Carmelina Paddock, Potassium4.6, [id number], [id number] 10/13/2018 Creatinine0.75, BUN17, Potassium4.3, [id number], [id number] A complete set of results can be found in Results Review.  Recommendations:No changes and encouraged to call if experiencing any fluid symptoms.  Follow-up plan: ICM clinic phone appointment on 08/05/2019.   91 day device clinic remote transmission 07/15/2019.  Office appt 08/16/2019 with Dr. Rayann Heman.    Copy of ICM check sent to Dr. Rayann Heman.   3 month ICM trend: 06/24/2019    1 Year ICM trend:       Rosalene Billings, RN 07/01/2019 4:24 PM

## 2019-07-15 ENCOUNTER — Ambulatory Visit (INDEPENDENT_AMBULATORY_CARE_PROVIDER_SITE_OTHER): Payer: Medicare HMO | Admitting: *Deleted

## 2019-07-15 DIAGNOSIS — I5042 Chronic combined systolic (congestive) and diastolic (congestive) heart failure: Secondary | ICD-10-CM | POA: Diagnosis not present

## 2019-07-17 ENCOUNTER — Telehealth: Payer: Self-pay

## 2019-07-17 NOTE — Telephone Encounter (Signed)
Left message for patient to remind of missed remote transmission.  

## 2019-07-19 LAB — CUP PACEART REMOTE DEVICE CHECK
Battery Remaining Longevity: 88 mo
Battery Remaining Percentage: 79 %
Battery Voltage: 3.01 V
Brady Statistic RV Percent Paced: 1 %
Date Time Interrogation Session: 20210319041508
HighPow Impedance: 63 Ohm
HighPow Impedance: 63 Ohm
Implantable Lead Implant Date: 20190102
Implantable Lead Location: 753860
Implantable Pulse Generator Implant Date: 20190102
Lead Channel Impedance Value: 550 Ohm
Lead Channel Pacing Threshold Amplitude: 0.5 V
Lead Channel Pacing Threshold Pulse Width: 0.5 ms
Lead Channel Sensing Intrinsic Amplitude: 11.5 mV
Lead Channel Setting Pacing Amplitude: 2.5 V
Lead Channel Setting Pacing Pulse Width: 0.5 ms
Lead Channel Setting Sensing Sensitivity: 0.5 mV
Pulse Gen Serial Number: 9786935

## 2019-07-19 NOTE — Progress Notes (Signed)
ICD Remote  

## 2019-07-26 ENCOUNTER — Other Ambulatory Visit: Payer: Self-pay

## 2019-07-26 ENCOUNTER — Encounter: Payer: Self-pay | Admitting: Family Medicine

## 2019-07-26 ENCOUNTER — Ambulatory Visit (INDEPENDENT_AMBULATORY_CARE_PROVIDER_SITE_OTHER): Payer: Medicare HMO | Admitting: Family Medicine

## 2019-07-26 DIAGNOSIS — I10 Essential (primary) hypertension: Secondary | ICD-10-CM | POA: Diagnosis not present

## 2019-07-26 DIAGNOSIS — H9312 Tinnitus, left ear: Secondary | ICD-10-CM

## 2019-07-26 DIAGNOSIS — N401 Enlarged prostate with lower urinary tract symptoms: Secondary | ICD-10-CM

## 2019-07-26 DIAGNOSIS — R3914 Feeling of incomplete bladder emptying: Secondary | ICD-10-CM

## 2019-07-26 DIAGNOSIS — E782 Mixed hyperlipidemia: Secondary | ICD-10-CM | POA: Diagnosis not present

## 2019-07-26 DIAGNOSIS — I5042 Chronic combined systolic (congestive) and diastolic (congestive) heart failure: Secondary | ICD-10-CM | POA: Diagnosis not present

## 2019-07-26 DIAGNOSIS — E785 Hyperlipidemia, unspecified: Secondary | ICD-10-CM | POA: Insufficient documentation

## 2019-07-26 NOTE — Progress Notes (Addendum)
BP 107/70   Pulse 67   Temp 98.6 F (37 C)   Ht '5\' 10"'  (1.778 m)   Wt (!) 311 lb (141.1 kg)   SpO2 94%   BMI 44.62 kg/m    Subjective:   Patient ID: Russell Werner, male    DOB: 1956-01-08, 64 y.o.   MRN: [id number]  HPI: Russell Werner is a 64 y.o. male presenting on 07/26/2019 for Medical Management of Chronic Issues (53m and Tinnitus (L>R)   HPI Hypertension Patient is currently on bisoprolol and Entresto and spironolactone, and their blood pressure today is 107/70. Patient denies any lightheadedness or dizziness. Patient denies headaches, blurred vision, chest pains, shortness of breath, or weakness. Denies any side effects from medication and is content with current medication.   Hyperlipidemia Patient is coming in for recheck of his hyperlipidemia. The patient is currently taking Crestor. They deny any issues with myalgias or history of liver damage from it. They deny any focal numbness or weakness or chest pain.   BPH Patient is coming in for recheck on BPH Symptoms: None currently Medication: Finasteride Last PSA: 09/2017 was 2.1  Relevant past medical, surgical, family and social history reviewed and updated as indicated. Interim medical history since our last visit reviewed. Allergies and medications reviewed and updated.  Review of Systems  Constitutional: Negative for chills and fever.  Respiratory: Negative for shortness of breath and wheezing.   Cardiovascular: Negative for chest pain, palpitations and leg swelling.  Musculoskeletal: Negative for back pain and gait problem.  Skin: Negative for rash.  Neurological: Negative for dizziness, weakness and light-headedness.  All other systems reviewed and are negative.   Per HPI unless specifically indicated above   Allergies as of 07/26/2019   No Known Allergies     Medication List       Accurate as of July 26, 2019  8:20 AM. If you have any questions, ask your nurse or doctor.        STOP taking  these medications   Myrbetriq 50 MG Tb24 tablet Generic drug: mirabegron ER Stopped by: JFransisca KaufmannDettinger, MD     TAKE these medications   aspirin EC 81 MG tablet Take 81 mg by mouth daily.   bisoprolol 5 MG tablet Commonly known as: ZEBETA Take 0.5 tablets (2.5 mg total) by mouth daily.   Entresto 24-26 MG Generic drug: sacubitril-valsartan Take 1 tablet by mouth 2 (two) times daily.   finasteride 5 MG tablet Commonly known as: PROSCAR TAKE 1 TABLET EVERY DAY   multivitamin capsule Take 1 capsule by mouth 2 (two) times daily.   rosuvastatin 20 MG tablet Commonly known as: CRESTOR Take 1 tablet (20 mg total) by mouth daily.   spironolactone 25 MG tablet Commonly known as: ALDACTONE Take 1 tablet (25 mg total) by mouth daily.   vitamin B-12 500 MCG tablet Commonly known as: CYANOCOBALAMIN Take 500 mcg by mouth daily.   vitamin C 1000 MG tablet Take 2,000 mg by mouth daily.        Objective:   BP 107/70   Pulse 67   Temp 98.6 F (37 C)   Ht '5\' 10"'  (1.778 m)   Wt (!) 311 lb (141.1 kg)   SpO2 94%   BMI 44.62 kg/m   Wt Readings from Last 3 Encounters:  07/26/19 (!) 311 lb (141.1 kg)  05/17/19 (!) 300 lb 12.8 oz (136.4 kg)  02/11/19 284 lb 6.4 oz (129 kg)    Physical Exam  Vitals and nursing note reviewed.  Constitutional:      General: He is not in acute distress.    Appearance: He is well-developed. He is not diaphoretic.  Eyes:     General: No scleral icterus.    Conjunctiva/sclera: Conjunctivae normal.  Neck:     Thyroid: No thyromegaly.  Cardiovascular:     Rate and Rhythm: Normal rate and regular rhythm.     Heart sounds: Normal heart sounds. No murmur.  Pulmonary:     Effort: Pulmonary effort is normal. No respiratory distress.     Breath sounds: Normal breath sounds. No wheezing.  Musculoskeletal:        General: Normal range of motion.     Cervical back: Neck supple.  Lymphadenopathy:     Cervical: No cervical adenopathy.  Skin:     General: Skin is warm and dry.     Findings: No rash.  Neurological:     Mental Status: He is alert and oriented to person, place, and time.     Coordination: Coordination normal.  Psychiatric:        Behavior: Behavior normal.       Assessment & Plan:   Problem List Items Addressed This Visit      Cardiovascular and Mediastinum   CHF (congestive heart failure) ([place])   Hypertension   Relevant Orders   CBC with Differential/Platelet   CMP14+EGFR     Genitourinary   BPH (benign prostatic hyperplasia)   Relevant Orders   PSA, total and free     Other   Morbid obesity ([place]) - Primary   Hyperlipidemia   Relevant Orders   Lipid panel    Other Visit Diagnoses    Tinnitus of left ear          Continue current medication  Recommended going for hearing check for his tinnitus that is been going on for the past few months.  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 Follow up plan: Return in about 6 months (around 01/26/2020), or if symptoms worsen or fail to improve, for Hyperlipidemia and hypertension.  Counseling provided for all of the vaccine components Orders Placed This Encounter  Procedures  . CBC with Differential/Platelet  . CMP14+EGFR  . Lipid panel  . PSA, total and free    Caryl Pina, MD [facility] 07/26/2019, 8:20 AM

## 2019-07-27 LAB — CBC WITH DIFFERENTIAL/PLATELET
Basophils Absolute: 0.1 10*3/uL (ref 0.0–0.2)
Basos: 1 %
EOS (ABSOLUTE): 0.1 10*3/uL (ref 0.0–0.4)
Eos: 2 %
Hematocrit: 42.3 % (ref 37.5–51.0)
Hemoglobin: 14.1 g/dL (ref 13.0–17.7)
Immature Grans (Abs): 0.1 10*3/uL (ref 0.0–0.1)
Immature Granulocytes: 1 %
Lymphocytes Absolute: 2.2 10*3/uL (ref 0.7–3.1)
Lymphs: 25 %
MCH: 31.2 pg (ref 26.6–33.0)
MCHC: 33.3 g/dL (ref 31.5–35.7)
MCV: 94 fL (ref 79–97)
Monocytes Absolute: 0.6 10*3/uL (ref 0.1–0.9)
Monocytes: 6 %
Neutrophils Absolute: 5.6 10*3/uL (ref 1.4–7.0)
Neutrophils: 65 %
Platelets: 211 10*3/uL (ref 150–450)
RBC: 4.52 x10E6/uL (ref 4.14–5.80)
RDW: 12.7 % (ref 11.6–15.4)
WBC: 8.6 10*3/uL (ref 3.4–10.8)

## 2019-07-27 LAB — PSA, TOTAL AND FREE
PSA, Free Pct: 35 %
PSA, Free: 0.07 ng/mL
Prostate Specific Ag, Serum: 0.2 ng/mL (ref 0.0–4.0)

## 2019-07-27 LAB — LIPID PANEL
Chol/HDL Ratio: 2.7 ratio (ref 0.0–5.0)
Cholesterol, Total: 177 mg/dL (ref 100–199)
HDL: 66 mg/dL (ref >39)
LDL Chol Calc (NIH): 81 mg/dL (ref 0–99)
Triglycerides: 181 mg/dL — ABNORMAL HIGH (ref 0–149)
VLDL Cholesterol Cal: 30 mg/dL (ref 5–40)

## 2019-07-27 LAB — CMP14+EGFR
ALT: 20 IU/L (ref 0–44)
AST: 12 IU/L (ref 0–40)
Albumin/Globulin Ratio: 1.6 (ref 1.2–2.2)
Albumin: 4.1 g/dL (ref 3.8–4.8)
Alkaline Phosphatase: 67 IU/L (ref 39–117)
BUN/Creatinine Ratio: 15 (ref 10–24)
BUN: 14 mg/dL (ref 8–27)
Bilirubin Total: 0.4 mg/dL (ref 0.0–1.2)
CO2: 26 mmol/L (ref 20–29)
Calcium: 9.3 mg/dL (ref 8.6–10.2)
Chloride: 103 mmol/L (ref 96–106)
Creatinine, Ser: 0.93 mg/dL (ref 0.76–1.27)
GFR calc Af Amer: 99 mL/min/{1.73_m2} (ref >59)
GFR calc non Af Amer: 85 mL/min/{1.73_m2} (ref >59)
Globulin, Total: 2.5 g/dL (ref 1.5–4.5)
Glucose: 121 mg/dL — ABNORMAL HIGH (ref 65–99)
Potassium: 5.2 mmol/L (ref 3.5–5.2)
Sodium: 142 mmol/L (ref 134–144)
Total Protein: 6.6 g/dL (ref 6.0–8.5)

## 2019-08-05 ENCOUNTER — Ambulatory Visit (INDEPENDENT_AMBULATORY_CARE_PROVIDER_SITE_OTHER): Payer: Medicare HMO

## 2019-08-05 DIAGNOSIS — I5022 Chronic systolic (congestive) heart failure: Secondary | ICD-10-CM

## 2019-08-05 DIAGNOSIS — Z95810 Presence of automatic (implantable) cardiac defibrillator: Secondary | ICD-10-CM

## 2019-08-06 ENCOUNTER — Telehealth: Payer: Self-pay

## 2019-08-06 NOTE — Progress Notes (Signed)
EPIC Encounter for ICM Monitoring  Patient Name: Russell Werner is a 64 y.o. male Date: 08/06/2019 Primary Care Physican: Dettinger, Fransisca Kaufmann, MD Primary [place] Electrophysiologist:Allred 07/26/2019 Office [phone]   Attempted call to patient and unable to reach.  Left detailed message per DPR regarding transmission. Transmission reviewed.   CorVue thoracic impedancenormal.  No diuretic.  Labs: 07/26/2019 Creatinine 0.93, BUN 14, Potassium 5.2, Sodium 142, GFR 85-99 01/25/2019 Creatinine0.96, BUN19, Potassium4.6, [id number], [id number] 10/13/2018 Creatinine0.75, BUN17, Potassium4.3, [id number], [id number] A complete set of results can be found in Results Review.  Recommendations:Left voice mail with ICM number and encouraged to call if experiencing any fluid symptoms.  Follow-up plan: ICM clinic phone appointment on5/01/2020. 91 day device clinic remote transmission 10/14/2019. Office appt 08/16/2019 with Dr.Allred.   Copy of ICM check sent to Dr.Allred.   3 month ICM trend: 08/05/2019    1 Year ICM trend:       Rosalene Billings, RN 08/06/2019 12:15 PM

## 2019-08-06 NOTE — Telephone Encounter (Signed)
Remote ICM transmission received.  Attempted call to patient regarding ICM remote transmission and left detailed message per DPR.  Advised to return call for any fluid symptoms or questions. Next ICM remote transmission scheduled 09/09/2019.     

## 2019-08-09 ENCOUNTER — Other Ambulatory Visit: Payer: Self-pay | Admitting: Family Medicine

## 2019-08-16 ENCOUNTER — Telehealth (INDEPENDENT_AMBULATORY_CARE_PROVIDER_SITE_OTHER): Payer: Medicare HMO | Admitting: Internal Medicine

## 2019-08-16 ENCOUNTER — Encounter: Payer: Self-pay | Admitting: Internal Medicine

## 2019-08-16 VITALS — BP 110/68 | HR 73 | Ht 70.0 in | Wt 305.0 lb

## 2019-08-16 DIAGNOSIS — Z87891 Personal history of nicotine dependence: Secondary | ICD-10-CM | POA: Diagnosis not present

## 2019-08-16 DIAGNOSIS — Z6841 Body Mass Index (BMI) 40.0 and over, adult: Secondary | ICD-10-CM | POA: Diagnosis not present

## 2019-08-16 DIAGNOSIS — I5022 Chronic systolic (congestive) heart failure: Secondary | ICD-10-CM | POA: Diagnosis not present

## 2019-08-16 NOTE — Progress Notes (Signed)
Electrophysiology TeleHealth Note  Due to national recommendations of social distancing due to [place] 19, an audio telehealth visit is felt to be most appropriate for this patient at this time.  Verbal consent was obtained by me for the telehealth visit today.  The patient does not have capability for a virtual visit.  A phone visit is therefore required today.   Date:  08/16/2019   ID:  Russell Werner, DOB [birth year], MRN [id number]  Location: patient's home  Provider location:  [place]  Evaluation Performed: Follow-up visit  PCP:  Dettinger, Fransisca Kaufmann, MD   Electrophysiologist:  Dr Rayann Heman  Chief Complaint:  CHF  History of Present Illness:    Russell Werner is a 64 y.o. male who presents via telehealth conferencing today.  Since last being seen in our clinic, the patient reports doing very well.  He states "I am doing great!"  His primary concern is with his weight.  He is not very active.  Today, he denies symptoms of palpitations, chest pain, shortness of breath,  lower extremity edema, dizziness, presyncope, or syncope.  The patient is otherwise without complaint today.  The patient denies symptoms of fevers, chills, cough, or new SOB worrisome for COVID 19.  Past Medical History:  Diagnosis Date  . 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  . Arthritis    "fingers" (05/03/2017)  . CHF (congestive heart failure) ([place])   . COPD (chronic obstructive pulmonary disease) ([place])   . Coronary artery disease    nonobstructive mid RCA by 2009 cath at [facility] in [place]  . Enlarged prostate   . High cholesterol   . Hypertension   . Melanoma of back ([place]) 2013  . Meniscal injury   . Myocardial infarction ([place]) 1998   mild  . OSA on CPAP    "severe" (05/03/2017)  . Right knee meniscal tear     Past Surgical History:  Procedure Laterality Date  . CARDIAC CATHETERIZATION  X 2  . CARDIAC DEFIBRILLATOR PLACEMENT  05/03/2017  . CYST REMOVAL  LEG Bilateral 2013  . HERNIA REPAIR    . ICD IMPLANT N/A 05/03/2017   Procedure: ICD IMPLANT;  Surgeon: Thompson Grayer, MD;  Location: [facility];  Service: Cardiovascular;  Laterality: N/A;  . KNEE ARTHROSCOPY WITH MEDIAL MENISECTOMY Right 10/18/2017   Procedure: RIGHT KNEE ARTHROSCOPY WITH PARTIAL MEDIAL MENISCECTOMY;  Surgeon: Leandrew Koyanagi, MD;  Location: [place];  Service: Orthopedics;  Laterality: Right;  . KNEE CARTILAGE SURGERY Left 1972  . LAPAROSCOPIC CHOLECYSTECTOMY  2015  . LAPAROSCOPIC GASTRIC BAND REMOVAL WITH LAPAROSCOPIC GASTRIC SLEEVE RESECTION  11/05/2014  . MELANOMA EXCISION  2013   "back"  . UMBILICAL HERNIA REPAIR  2014    Current Outpatient Medications  Medication Sig Dispense Refill  . Ascorbic Acid (VITAMIN C) 1000 MG tablet Take 2,000 mg by mouth daily.    Marland Kitchen aspirin EC 81 MG tablet Take 81 mg by mouth daily.    . bisoprolol (ZEBETA) 5 MG tablet Take 5 mg by mouth daily.    . finasteride (PROSCAR) 5 MG tablet TAKE 1 TABLET EVERY DAY 90 tablet 0  . Multiple Vitamin (MULTIVITAMIN) capsule Take 1 capsule by mouth 2 (two) times daily.     . rosuvastatin (CRESTOR) 20 MG tablet TAKE 1 TABLET EVERY DAY 90 tablet 0  . sacubitril-valsartan (ENTRESTO) 24-26 MG Take 1 tablet by mouth 2 (two) times daily. 180 tablet 3  . spironolactone (ALDACTONE) 25 MG tablet  Take 1 tablet (25 mg total) by mouth daily. 30 tablet 11  . vitamin B-12 (CYANOCOBALAMIN) 500 MCG tablet Take 500 mcg by mouth daily.    Marland Kitchen VITAMIN D PO Take 1 tablet by mouth daily.     No current facility-administered medications for this visit.    Allergies:   Patient has no known allergies.   Social History:  The patient  reports that he quit smoking about 6 years ago. His smoking use included cigarettes. He started smoking about 52 years ago. He has a 47.00 pack-year smoking history. He has never used smokeless tobacco. He reports current alcohol use. He reports that he does not use drugs.   Family History:   The patient's family history includes Cancer in his maternal grandmother, sister, and sister; Early death in his mother; HIV in his brother; Stroke in his maternal grandfather.   ROS:  Please see the history of present illness.   All other systems are personally reviewed and negative.    Exam:    Vital Signs:  BP 110/68   Pulse 73   Ht 5\' 10"  (1.778 m)   Wt (!) 305 lb (138.3 kg)   SpO2 98%   BMI 43.76 kg/m   Well sounding, alert and conversant   Labs/Other Tests and Data Reviewed:    Recent Labs: 07/26/2019: ALT 20; BUN 14; Creatinine, Ser 0.93; Hemoglobin 14.1; Platelets 211; Potassium 5.2; Sodium 142   Wt Readings from Last 3 Encounters:  08/16/19 (!) 305 lb (138.3 kg)  07/26/19 (!) 311 lb (141.1 kg)  05/17/19 (!) 300 lb 12.8 oz (136.4 kg)     Last device remote is reviewed from [facility] PDF which reveals normal device function, no arrhythmias    ASSESSMENT & PLAN:    1.  Chronic systolic dysfunction euvolemic Remotes are up to date Normal device function  2. Tobacco Quit x 5 years!  3. Obesity Body mass index is 43.76 kg/m. Weight loss was discussed   Follow-up:  with me in a year   Patient Risk:  after full review of this patients clinical status, I feel that they are at moderate risk at this time.  Today, I have spent 15 minutes with the patient with telehealth technology discussing arrhythmia management .    Army Fossa, MD  08/16/2019 8:52 AM     [facility] [address] [place] [address] [phone] (office) [phone] (fax)

## 2019-08-22 ENCOUNTER — Other Ambulatory Visit: Payer: Self-pay | Admitting: Family Medicine

## 2019-09-09 ENCOUNTER — Ambulatory Visit (INDEPENDENT_AMBULATORY_CARE_PROVIDER_SITE_OTHER): Payer: Medicare HMO

## 2019-09-09 DIAGNOSIS — Z95810 Presence of automatic (implantable) cardiac defibrillator: Secondary | ICD-10-CM | POA: Diagnosis not present

## 2019-09-09 DIAGNOSIS — I5022 Chronic systolic (congestive) heart failure: Secondary | ICD-10-CM

## 2019-09-11 NOTE — Progress Notes (Signed)
EPIC Encounter for ICM Monitoring  Patient Name: Russell Werner is a 64 y.o. male Date: 09/11/2019 Primary Care Physican: Dettinger, Fransisca Kaufmann, MD Primary [place] Electrophysiologist:Allred 3/26/2021OfficeWeight311lbs   Spoke with patient and reports swelling of feet/ankles during decreased impedance.  He was on vacation.  Denies fluid symptoms.    CorVue thoracic impedancenormal but was suggesting possible fluid accumulation 5/2 -5/9.  No diuretic.  Labs: 07/26/2019 Creatinine 0.93, BUN 14, Potassium 5.2, Sodium 142, GFR 85-99 01/25/2019 Creatinine0.96, BUN19, Potassium4.6, [id number], [id number] 10/13/2018 Creatinine0.75, BUN17, Potassium4.3, [id number], [id number] A complete set of results can be found in Results Review.  Recommendations:Recommendation to limit salt intake to 2000 mg daily and fluid intake to 64 oz daily.  Encouraged to call if experiencing any fluid symptoms.   Follow-up plan: ICM clinic phone appointment on6/15/2021. 91 day device clinic remote transmission 10/14/2019.   Copy of ICM check sent to Dr.Allred.  3 month ICM trend: 09/09/2019    1 Year ICM trend:       Rosalene Billings, RN 09/11/2019 10:25 AM

## 2019-10-07 ENCOUNTER — Telehealth: Payer: Self-pay | Admitting: Internal Medicine

## 2019-10-07 MED ORDER — BISOPROLOL FUMARATE 5 MG PO TABS: 5.0000 mg | ORAL_TABLET | Freq: Every day | ORAL | 0 refills | Status: DC

## 2019-10-07 MED ORDER — BISOPROLOL FUMARATE 5 MG PO TABS: 5.0000 mg | ORAL_TABLET | Freq: Every day | ORAL | 1 refills | Status: DC

## 2019-10-07 NOTE — Addendum Note (Signed)
Addended by: Julian Hy T on: 10/07/2019 10:30 AM   Modules accepted: Orders

## 2019-10-07 NOTE — Telephone Encounter (Signed)
Patient called wanting to know if Bisoprolol 5mg  has been called to [facility]. Patient is out of medication now.

## 2019-10-07 NOTE — Telephone Encounter (Signed)
Pt says that Dr Rayann Heman said it would be ok to take bisoprolol 5 mg daily instead of 1/2 tablet (not mentioned in last telehealth appt notes) says the tablets are to hard to cut in half and he has been taking 5 mg daily - requested we send updated rx to [facility] and 14 day supply to [facility] - Medication sent to pharmacy and will forward to provider FYI

## 2019-10-14 ENCOUNTER — Ambulatory Visit (INDEPENDENT_AMBULATORY_CARE_PROVIDER_SITE_OTHER): Payer: Medicare HMO | Admitting: *Deleted

## 2019-10-14 DIAGNOSIS — I428 Other cardiomyopathies: Secondary | ICD-10-CM

## 2019-10-15 ENCOUNTER — Ambulatory Visit (INDEPENDENT_AMBULATORY_CARE_PROVIDER_SITE_OTHER): Payer: Medicare HMO

## 2019-10-15 DIAGNOSIS — Z95810 Presence of automatic (implantable) cardiac defibrillator: Secondary | ICD-10-CM

## 2019-10-15 DIAGNOSIS — I5022 Chronic systolic (congestive) heart failure: Secondary | ICD-10-CM

## 2019-10-15 LAB — CUP PACEART REMOTE DEVICE CHECK
Battery Remaining Longevity: 85 mo
Battery Remaining Percentage: 77 %
Battery Voltage: 3.01 V
Brady Statistic RV Percent Paced: 1 %
Date Time Interrogation Session: 20210614224313
HighPow Impedance: 72 Ohm
HighPow Impedance: 72 Ohm
Implantable Lead Implant Date: 20190102
Implantable Lead Location: 753860
Implantable Pulse Generator Implant Date: 20190102
Lead Channel Impedance Value: 630 Ohm
Lead Channel Pacing Threshold Amplitude: 0.5 V
Lead Channel Pacing Threshold Pulse Width: 0.5 ms
Lead Channel Sensing Intrinsic Amplitude: 11.5 mV
Lead Channel Setting Pacing Amplitude: 2.5 V
Lead Channel Setting Pacing Pulse Width: 0.5 ms
Lead Channel Setting Sensing Sensitivity: 0.5 mV
Pulse Gen Serial Number: 9786935

## 2019-10-16 NOTE — Progress Notes (Signed)
Remote ICD transmission.   

## 2019-10-18 NOTE — Progress Notes (Signed)
EPIC Encounter for ICM Monitoring  Patient Name: Russell Werner is a 64 y.o. male Date: 10/18/2019 Primary Care Physican: Dettinger, Fransisca Kaufmann, MD Primary [place] Electrophysiologist:Allred 6/18/2021Weight300lbs(hasn't weighed in a while)   Spoke with patient and reports feeling well at this time.  Denies fluid symptoms.     CorVue thoracic impedancenormal.  No diuretic.  Labs: 07/26/2019 Creatinine 0.93, BUN 14, Potassium 5.2, Sodium 142, GFR 85-99 01/25/2019 Creatinine0.96, BUN19, Potassium4.6, [id number], [id number] 10/13/2018 Creatinine0.75, BUN17, Potassium4.3, [id number], [id number] A complete set of results can be found in Results Review.  Recommendations:Recommendation to limit salt intake to 2000 mg daily and fluid intake to 64 oz daily.  Encouraged to call if experiencing any fluid symptoms.   Follow-up plan: ICM clinic phone appointment on7/19/2021. 91 day device clinic remote transmission9/13/2021.   Copy of ICM check sent to Dr.Allred.  3 month ICM trend: 10/15/2019    1 Year ICM trend:       Rosalene Billings, RN 10/18/2019 10:57 AM

## 2019-10-23 ENCOUNTER — Other Ambulatory Visit: Payer: Self-pay | Admitting: Family Medicine

## 2019-10-28 DIAGNOSIS — Y9389 Activity, other specified: Secondary | ICD-10-CM | POA: Diagnosis not present

## 2019-10-28 DIAGNOSIS — Z96653 Presence of artificial knee joint, bilateral: Secondary | ICD-10-CM | POA: Diagnosis not present

## 2019-10-28 DIAGNOSIS — I5022 Chronic systolic (congestive) heart failure: Secondary | ICD-10-CM | POA: Diagnosis not present

## 2019-10-28 DIAGNOSIS — Z9181 History of falling: Secondary | ICD-10-CM | POA: Diagnosis not present

## 2019-10-28 DIAGNOSIS — S72145A Nondisplaced intertrochanteric fracture of left femur, initial encounter for closed fracture: Secondary | ICD-10-CM | POA: Diagnosis not present

## 2019-10-28 DIAGNOSIS — Y9229 Other specified public building as the place of occurrence of the external cause: Secondary | ICD-10-CM | POA: Diagnosis not present

## 2019-10-28 DIAGNOSIS — Z7401 Bed confinement status: Secondary | ICD-10-CM | POA: Diagnosis not present

## 2019-10-28 DIAGNOSIS — S6991XA Unspecified injury of right wrist, hand and finger(s), initial encounter: Secondary | ICD-10-CM | POA: Diagnosis not present

## 2019-10-28 DIAGNOSIS — R2689 Other abnormalities of gait and mobility: Secondary | ICD-10-CM | POA: Diagnosis not present

## 2019-10-28 DIAGNOSIS — W19XXXA Unspecified fall, initial encounter: Secondary | ICD-10-CM | POA: Diagnosis not present

## 2019-10-28 DIAGNOSIS — I7090 Unspecified atherosclerosis: Secondary | ICD-10-CM | POA: Diagnosis not present

## 2019-10-28 DIAGNOSIS — Z6841 Body Mass Index (BMI) 40.0 and over, adult: Secondary | ICD-10-CM | POA: Diagnosis not present

## 2019-10-28 DIAGNOSIS — Z20822 Contact with and (suspected) exposure to covid-19: Secondary | ICD-10-CM | POA: Diagnosis not present

## 2019-10-28 DIAGNOSIS — J449 Chronic obstructive pulmonary disease, unspecified: Secondary | ICD-10-CM | POA: Diagnosis not present

## 2019-10-28 DIAGNOSIS — S72112D Displaced fracture of greater trochanter of left femur, subsequent encounter for closed fracture with routine healing: Secondary | ICD-10-CM | POA: Diagnosis not present

## 2019-10-28 DIAGNOSIS — S79912A Unspecified injury of left hip, initial encounter: Secondary | ICD-10-CM | POA: Diagnosis not present

## 2019-10-28 DIAGNOSIS — Z7982 Long term (current) use of aspirin: Secondary | ICD-10-CM | POA: Diagnosis not present

## 2019-10-28 DIAGNOSIS — M25552 Pain in left hip: Secondary | ICD-10-CM | POA: Diagnosis not present

## 2019-10-28 DIAGNOSIS — S72115A Nondisplaced fracture of greater trochanter of left femur, initial encounter for closed fracture: Secondary | ICD-10-CM | POA: Diagnosis not present

## 2019-10-28 DIAGNOSIS — I110 Hypertensive heart disease with heart failure: Secondary | ICD-10-CM | POA: Diagnosis not present

## 2019-10-28 DIAGNOSIS — I509 Heart failure, unspecified: Secondary | ICD-10-CM | POA: Diagnosis not present

## 2019-10-28 DIAGNOSIS — R52 Pain, unspecified: Secondary | ICD-10-CM | POA: Diagnosis not present

## 2019-10-28 DIAGNOSIS — R0902 Hypoxemia: Secondary | ICD-10-CM | POA: Diagnosis not present

## 2019-10-28 DIAGNOSIS — S72142A Displaced intertrochanteric fracture of left femur, initial encounter for closed fracture: Secondary | ICD-10-CM | POA: Diagnosis not present

## 2019-10-28 DIAGNOSIS — M25462 Effusion, left knee: Secondary | ICD-10-CM | POA: Diagnosis not present

## 2019-10-28 DIAGNOSIS — W010XXA Fall on same level from slipping, tripping and stumbling without subsequent striking against object, initial encounter: Secondary | ICD-10-CM | POA: Diagnosis not present

## 2019-10-28 DIAGNOSIS — S72002A Fracture of unspecified part of neck of left femur, initial encounter for closed fracture: Secondary | ICD-10-CM | POA: Diagnosis not present

## 2019-10-28 DIAGNOSIS — M6281 Muscle weakness (generalized): Secondary | ICD-10-CM | POA: Diagnosis not present

## 2019-10-28 DIAGNOSIS — S72115D Nondisplaced fracture of greater trochanter of left femur, subsequent encounter for closed fracture with routine healing: Secondary | ICD-10-CM | POA: Diagnosis not present

## 2019-10-28 DIAGNOSIS — W1839XA Other fall on same level, initial encounter: Secondary | ICD-10-CM | POA: Diagnosis not present

## 2019-10-28 DIAGNOSIS — Z95810 Presence of automatic (implantable) cardiac defibrillator: Secondary | ICD-10-CM | POA: Diagnosis not present

## 2019-10-28 DIAGNOSIS — G8918 Other acute postprocedural pain: Secondary | ICD-10-CM | POA: Diagnosis not present

## 2019-10-29 ENCOUNTER — Telehealth: Payer: Self-pay | Admitting: Family Medicine

## 2019-10-29 DIAGNOSIS — S72142D Displaced intertrochanteric fracture of left femur, subsequent encounter for closed fracture with routine healing: Secondary | ICD-10-CM

## 2019-10-29 NOTE — Telephone Encounter (Signed)
  REFERRAL REQUEST Telephone Note 10/29/2019  What type of referral do you need? Orthopedic for fx hip and femur  Have you been seen at our office for this problem? No-pt in hospital and does not agree with treatment plan that the orthopedic in the hospital wants to do and requesting a second opinion. (Advise that they may need an appointment with their PCP before a referral can be done)  Is there a particular doctor or location that you prefer? Dr. Lorin Mercy  Patient notified that referrals can take up to a week or longer to process. If they haven't heard anything within a week they should call back and speak with the referral department.

## 2019-10-29 NOTE — Telephone Encounter (Signed)
FYI for provider

## 2019-10-30 DIAGNOSIS — R52 Pain, unspecified: Secondary | ICD-10-CM | POA: Insufficient documentation

## 2019-10-30 NOTE — Addendum Note (Signed)
Addended by: Caryl Pina on: 10/30/2019 09:47 PM   Modules accepted: Orders

## 2019-10-30 NOTE — Telephone Encounter (Signed)
Please get an appointment for him as soon as he gets out of [facility] so that we can follow-up with him and get him the care that he needs.  It is common that these kind of fractures are not repaired surgically and managed conservatively with rehab but we would not be able to get him to another orthopedic for second opinion until he is out of the hospital unless he requests a transfer from the hospital but he has to request that and even then they do not always honor that.

## 2019-10-30 NOTE — Telephone Encounter (Signed)
Sent referral for the patient but I do not know if it is can I do any good because I do not know if he can come to [facility] or has privileges there. Caryl Pina, MD [facility] 10/30/2019, 9:47 PM

## 2019-10-31 NOTE — Telephone Encounter (Signed)
Patient aware and states that he is still admitted.  Patient will call back when discharged from hospital

## 2019-11-05 DIAGNOSIS — I5022 Chronic systolic (congestive) heart failure: Secondary | ICD-10-CM | POA: Diagnosis not present

## 2019-11-05 DIAGNOSIS — E7849 Other hyperlipidemia: Secondary | ICD-10-CM | POA: Diagnosis not present

## 2019-11-05 DIAGNOSIS — Y9229 Other specified public building as the place of occurrence of the external cause: Secondary | ICD-10-CM | POA: Diagnosis not present

## 2019-11-05 DIAGNOSIS — M6281 Muscle weakness (generalized): Secondary | ICD-10-CM | POA: Diagnosis not present

## 2019-11-05 DIAGNOSIS — M79652 Pain in left thigh: Secondary | ICD-10-CM | POA: Diagnosis not present

## 2019-11-05 DIAGNOSIS — I5021 Acute systolic (congestive) heart failure: Secondary | ICD-10-CM | POA: Diagnosis not present

## 2019-11-05 DIAGNOSIS — Y9389 Activity, other specified: Secondary | ICD-10-CM | POA: Diagnosis not present

## 2019-11-05 DIAGNOSIS — Z95810 Presence of automatic (implantable) cardiac defibrillator: Secondary | ICD-10-CM | POA: Diagnosis not present

## 2019-11-05 DIAGNOSIS — Z9181 History of falling: Secondary | ICD-10-CM | POA: Diagnosis not present

## 2019-11-05 DIAGNOSIS — J449 Chronic obstructive pulmonary disease, unspecified: Secondary | ICD-10-CM | POA: Diagnosis not present

## 2019-11-05 DIAGNOSIS — S72115D Nondisplaced fracture of greater trochanter of left femur, subsequent encounter for closed fracture with routine healing: Secondary | ICD-10-CM | POA: Diagnosis not present

## 2019-11-05 DIAGNOSIS — I110 Hypertensive heart disease with heart failure: Secondary | ICD-10-CM | POA: Diagnosis not present

## 2019-11-05 DIAGNOSIS — R2689 Other abnormalities of gait and mobility: Secondary | ICD-10-CM | POA: Diagnosis not present

## 2019-11-05 DIAGNOSIS — Z7982 Long term (current) use of aspirin: Secondary | ICD-10-CM | POA: Diagnosis not present

## 2019-11-05 DIAGNOSIS — Z7401 Bed confinement status: Secondary | ICD-10-CM | POA: Diagnosis not present

## 2019-11-05 DIAGNOSIS — S72115A Nondisplaced fracture of greater trochanter of left femur, initial encounter for closed fracture: Secondary | ICD-10-CM | POA: Diagnosis not present

## 2019-11-05 DIAGNOSIS — R0902 Hypoxemia: Secondary | ICD-10-CM | POA: Diagnosis not present

## 2019-11-05 DIAGNOSIS — W010XXA Fall on same level from slipping, tripping and stumbling without subsequent striking against object, initial encounter: Secondary | ICD-10-CM | POA: Diagnosis not present

## 2019-11-05 DIAGNOSIS — M25562 Pain in left knee: Secondary | ICD-10-CM | POA: Diagnosis not present

## 2019-11-05 DIAGNOSIS — M25552 Pain in left hip: Secondary | ICD-10-CM | POA: Diagnosis not present

## 2019-11-05 DIAGNOSIS — Z6841 Body Mass Index (BMI) 40.0 and over, adult: Secondary | ICD-10-CM | POA: Diagnosis not present

## 2019-11-06 DIAGNOSIS — I5021 Acute systolic (congestive) heart failure: Secondary | ICD-10-CM | POA: Diagnosis not present

## 2019-11-06 DIAGNOSIS — S72115A Nondisplaced fracture of greater trochanter of left femur, initial encounter for closed fracture: Secondary | ICD-10-CM | POA: Diagnosis not present

## 2019-11-06 DIAGNOSIS — E7849 Other hyperlipidemia: Secondary | ICD-10-CM | POA: Diagnosis not present

## 2019-11-14 ENCOUNTER — Other Ambulatory Visit: Payer: Self-pay

## 2019-11-14 ENCOUNTER — Ambulatory Visit (INDEPENDENT_AMBULATORY_CARE_PROVIDER_SITE_OTHER): Payer: Medicare HMO | Admitting: Orthopaedic Surgery

## 2019-11-14 VITALS — Ht 70.0 in | Wt 305.0 lb

## 2019-11-14 DIAGNOSIS — S72115A Nondisplaced fracture of greater trochanter of left femur, initial encounter for closed fracture: Secondary | ICD-10-CM

## 2019-11-14 DIAGNOSIS — Z6841 Body Mass Index (BMI) 40.0 and over, adult: Secondary | ICD-10-CM

## 2019-11-14 DIAGNOSIS — S72113A Displaced fracture of greater trochanter of unspecified femur, initial encounter for closed fracture: Secondary | ICD-10-CM | POA: Insufficient documentation

## 2019-11-14 NOTE — Progress Notes (Signed)
Office Visit Note   Patient: Russell Werner           Date of Birth: 06-03-1955           MRN: [id number] Visit Date: 11/14/2019              Requested by: Dettinger, Fransisca Kaufmann, MD [place],  [place] [address] PCP: Dettinger, Fransisca Kaufmann, MD   Assessment & Plan: Visit Diagnoses:  1. Closed nondisplaced fracture of greater trochanter of left femur, initial encounter ([place])     Plan: I discussed patient agreed with the outlined treatment plan of nonweightbearing and no surgery was indicated at that time.  He needs to continue to be nonweightbearing at skilled facility and can return in 3 weeks for repeat x-rays AP frog-leg left hip.  We discussed with his morbid obesity visualization of the fracture may be difficult due to body habitus. The patient meets the AMA guidelines for Morbid (severe) obesity with a BMI > 40.0 and I have recommended weight loss.  Follow-Up Instructions: Return in about 3 weeks (around 12/05/2019).   Orders:  No orders of the defined types were placed in this encounter.  No orders of the defined types were placed in this encounter.     Procedures: No procedures performed   Clinical Data: No additional findings.   Subjective: Chief Complaint  Patient presents with  . Left Hip - Pain    HPI 64 year old male who is here in extrawide wheelchair and is currently staying at a skilled facility here in [place] had a history of slipping and falling at [facility] with left nondisplaced intertrochanteric fracture visualized only on CT scan.  He was admitted to [facility] in [place] and was seen by the local orthopedist Dr. Case who recommended nonoperative treatment.  Patient comes in here today for follow-up.  Patient been taking Percocet for pain he states when he puts his foot down and moves that he feels some motion of bone up by his hip region.  Review of Systems possible history of heart failure hypertension cardiomyopathy sleep apnea.   Positive morbid obesity BMI 43.  Otherwise noncontributory to HPI.   Objective: Vital Signs: Ht 5\' 10"  (1.778 m)   Wt (!) 305 lb (138.3 kg)   BMI 43.76 kg/m   Physical Exam Constitutional:      Appearance: He is well-developed.  HENT:     Head: Normocephalic and atraumatic.  Eyes:     Pupils: Pupils are equal, round, and reactive to light.  Neck:     Thyroid: No thyromegaly.     Trachea: No tracheal deviation.  Cardiovascular:     Rate and Rhythm: Normal rate.  Pulmonary:     Effort: Pulmonary effort is normal.     Breath sounds: No wheezing.  Abdominal:     General: Bowel sounds are normal.     Palpations: Abdomen is soft.     Comments: Morbid obesity  Skin:    General: Skin is warm and dry.     Capillary Refill: Capillary refill takes less than 2 seconds.  Neurological:     Mental Status: He is alert and oriented to person, place, and time.  Psychiatric:        Behavior: Behavior normal.        Thought Content: Thought content normal.        Judgment: Judgment normal.     Ortho Exam mild edema both lower extremities.  Patient has  tenderness of the greater trochanter.  Specialty Comments:  No specialty comments available.  Imaging: No results found.   PMFS History: Patient Active Problem List   Diagnosis Date Noted  . Greater trochanter fracture ([place]) 11/14/2019  . Hyperlipidemia 07/26/2019  . S/P right knee arthroscopy 10/27/2017  . Morbid obesity ([place]) 10/12/2017  . Nonischemic cardiomyopathy ([place]) 05/03/2017  . Obstructive sleep apnea treated with continuous positive airway pressure (CPAP) 04/14/2017  . Sleep apnea, obstructive 10/12/2016  . CHF (congestive heart failure) ([place]) 10/12/2016  . Hypertension 10/12/2016  . BPH (benign prostatic hyperplasia) 10/12/2016  . History of melanoma 10/12/2016   Past Medical History:  Diagnosis Date  . 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  . Arthritis    "fingers" (05/03/2017)  . CHF  (congestive heart failure) ([place])   . COPD (chronic obstructive pulmonary disease) ([place])   . Coronary artery disease    nonobstructive mid RCA by 2009 cath at [facility] in [place]  . Enlarged prostate   . High cholesterol   . Hypertension   . Melanoma of back ([place]) 2013  . Meniscal injury   . Myocardial infarction ([place]) 1998   mild  . OSA on CPAP    "severe" (05/03/2017)  . Right knee meniscal tear     Family History  Problem Relation Age of Onset  . Early death Mother   . Cancer Sister        breast  . HIV Brother   . Cancer Maternal Grandmother        breast  . Stroke Maternal Grandfather   . Cancer Sister        melanoma    Past Surgical History:  Procedure Laterality Date  . CARDIAC CATHETERIZATION  X 2  . CARDIAC DEFIBRILLATOR PLACEMENT  05/03/2017  . CYST REMOVAL LEG Bilateral 2013  . HERNIA REPAIR    . ICD IMPLANT N/A 05/03/2017   Procedure: ICD IMPLANT;  Surgeon: Thompson Grayer, MD;  Location: [place] [facility];  Service: Cardiovascular;  Laterality: N/A;  . KNEE ARTHROSCOPY WITH MEDIAL MENISECTOMY Right 10/18/2017   Procedure: RIGHT KNEE ARTHROSCOPY WITH PARTIAL MEDIAL MENISCECTOMY;  Surgeon: Leandrew Koyanagi, MD;  Location: [place];  Service: Orthopedics;  Laterality: Right;  . KNEE CARTILAGE SURGERY Left 1972  . LAPAROSCOPIC CHOLECYSTECTOMY  2015  . LAPAROSCOPIC GASTRIC BAND REMOVAL WITH LAPAROSCOPIC GASTRIC SLEEVE RESECTION  11/05/2014  . MELANOMA EXCISION  2013   "back"  . UMBILICAL HERNIA REPAIR  2014   Social History   Occupational History  . Not on file  Tobacco Use  . Smoking status: Former Smoker    Packs/day: 1.00    Years: 47.00    Pack years: 47.00    Types: Cigarettes    Start date: 06/24/1967    Quit date: 05/02/2013    Years since quitting: 6.5  . Smokeless tobacco: Never Used  Vaping Use  . Vaping Use: Every day  Substance and Sexual Activity  . Alcohol use: Yes    Comment:  "1-2 glasses of wine/month"  . Drug use: No  . Sexual  activity: Yes

## 2019-11-25 ENCOUNTER — Telehealth: Payer: Self-pay | Admitting: Family Medicine

## 2019-11-25 NOTE — Telephone Encounter (Signed)
[facility] rehab called and appt given for pt.

## 2019-11-26 ENCOUNTER — Telehealth: Payer: Self-pay

## 2019-11-26 ENCOUNTER — Telehealth: Payer: Self-pay | Admitting: Orthopaedic Surgery

## 2019-11-26 ENCOUNTER — Ambulatory Visit (INDEPENDENT_AMBULATORY_CARE_PROVIDER_SITE_OTHER): Payer: Medicare HMO

## 2019-11-26 DIAGNOSIS — Z95810 Presence of automatic (implantable) cardiac defibrillator: Secondary | ICD-10-CM | POA: Diagnosis not present

## 2019-11-26 DIAGNOSIS — I5022 Chronic systolic (congestive) heart failure: Secondary | ICD-10-CM | POA: Diagnosis not present

## 2019-11-26 MED ORDER — TRAMADOL HCL 50 MG PO TABS: 50.0000 mg | ORAL_TABLET | Freq: Three times a day (TID) | ORAL | 0 refills | Status: DC | PRN

## 2019-11-26 NOTE — Addendum Note (Signed)
Addended by: Meyer Cory on: 11/26/2019 12:53 PM   Modules accepted: Orders

## 2019-11-26 NOTE — Telephone Encounter (Signed)
Patient was admitted to [facility] hospital on 10/28/2019 and transferred to [facility] for rehab for closed nondisplaced fracture of greater trochanter of left femur with routine healing.  Patient was contacted to arrange a TOC appointment.  Patient only preferred to see Dr. Warrick Parisian so an appointment was only available to be scheduled on 12/19/2019 at 12:55 pm.  This is on day 22 after discharge from skilled nursing.

## 2019-11-26 NOTE — Telephone Encounter (Signed)
Please advise 

## 2019-11-26 NOTE — Telephone Encounter (Signed)
Ultram  50mg  one PO q 8 hrs prn pain .  # 40 .  [facility] [place] [facility]

## 2019-11-26 NOTE — Telephone Encounter (Signed)
Patient called.   He is requesting some pain medication be called in for him to the [place] back: [phone]

## 2019-11-26 NOTE — Telephone Encounter (Signed)
Called to pharmacy. I left voicemail advising. 

## 2019-11-27 ENCOUNTER — Other Ambulatory Visit: Payer: Self-pay

## 2019-11-27 NOTE — Patient Outreach (Signed)
[facility]) Care Management  11/27/2019  JACARIE PATE June 04, 1955 [id number]   Referral Date: 11/27/19 Referral Source: [employer] Report Date of Discharge: 11/26/19 Facility: [place]: [facility]   Referral received.  No outreach warranted at this time.  Transition of Care calls being completed via EMMI. RN CM will outreach patient for any red flags received.    Plan: RN CM will close case.    Jone Baseman, RN, MSN [facility] Care Management Care Management Coordinator Direct Line [phone] Toll Free: [phone]  Fax: [phone]

## 2019-11-29 ENCOUNTER — Telehealth: Payer: Self-pay

## 2019-11-29 NOTE — Telephone Encounter (Signed)
The pt states the transmission did not go thru because he was in the hospital for four weeks. He agreed to send one today.

## 2019-12-02 ENCOUNTER — Telehealth: Payer: Self-pay

## 2019-12-02 DIAGNOSIS — J449 Chronic obstructive pulmonary disease, unspecified: Secondary | ICD-10-CM | POA: Diagnosis not present

## 2019-12-02 DIAGNOSIS — Z9181 History of falling: Secondary | ICD-10-CM | POA: Diagnosis not present

## 2019-12-02 DIAGNOSIS — S72115D Nondisplaced fracture of greater trochanter of left femur, subsequent encounter for closed fracture with routine healing: Secondary | ICD-10-CM | POA: Diagnosis not present

## 2019-12-02 NOTE — Progress Notes (Signed)
EPIC Encounter for ICM Monitoring  Patient Name: Russell Werner is a 64 y.o. male Date: 12/02/2019 Primary Care Physican: Dettinger, Fransisca Kaufmann, MD Primary [place] Electrophysiologist:Allred 7/15/2021OfficeWeight305lbs  Attempted call to patient and unable to reach.  Left detailed message per DPR regarding transmission. Transmission reviewed. Hospitalization & rehab for closed nondisplaced fracture of greater troch left femur.   CorVue thoracic impedancenormal but was suggesting possible fluid accumulation from 7/16-7/29 which correlates with hospitalization & rehab.  Prescribed: Spironolactone 25 mg take 1 tablet daily  Labs: 11/05/2019 Creatinine 0.93, BUN 24, Potassium 4.2, Sodium 143, GFR 85->90 Care Everywhere 10/29/2019 Creatinine 0.89, BUN 15, Potassium 4.6, Sodium 141, GFR 89->90 Care Everywhere 10/28/2019 Creatinine 0.88, BUN 13, Potassium 4.7, Sodium 142, GFR 90->90 Care Everywhere 07/26/2019 Creatinine 0.93, BUN 14, Potassium 5.2, Sodium 142, GFR 85-99 01/25/2019 Creatinine0.96, BUN19, Potassium4.6, Sodium142, [id number] 10/13/2018 Creatinine0.75, BUN17, Potassium4.3, [id number], [id number] A complete set of results can be found in Results Review.  Recommendations:Left voice mail with ICM number and encouraged to call if experiencing any fluid symptoms.  Follow-up plan: ICM clinic phone appointment on8/30/2021. 91 day device clinic remote transmission9/13/2021.    //EP/Cardiology Office Visits: 08/01/2019 with Dr. Rayann Heman.  Recall for 11/13/2019 with Dr Bronson Ing.   Copy of ICM check sent to Dr. Rayann Heman.   3 month ICM trend: 11/29/2019    1 Year ICM trend:       Rosalene Billings, RN 12/02/2019 11:01 AM

## 2019-12-02 NOTE — Telephone Encounter (Signed)
Remote ICM transmission received.  Attempted call to patient regarding ICM remote transmission and left detailed message per DPR.  Advised to return call for any fluid symptoms or questions. Next ICM remote transmission scheduled 12/30/2019.   ° °

## 2019-12-12 ENCOUNTER — Ambulatory Visit: Payer: Self-pay

## 2019-12-12 ENCOUNTER — Ambulatory Visit (INDEPENDENT_AMBULATORY_CARE_PROVIDER_SITE_OTHER): Payer: Medicare HMO | Admitting: Orthopaedic Surgery

## 2019-12-12 ENCOUNTER — Encounter: Payer: Self-pay | Admitting: Internal Medicine

## 2019-12-12 ENCOUNTER — Other Ambulatory Visit: Payer: Self-pay

## 2019-12-12 ENCOUNTER — Other Ambulatory Visit: Payer: Self-pay | Admitting: Family Medicine

## 2019-12-12 ENCOUNTER — Encounter: Payer: Self-pay | Admitting: Orthopaedic Surgery

## 2019-12-12 VITALS — Ht 70.0 in | Wt 305.0 lb

## 2019-12-12 DIAGNOSIS — S72115A Nondisplaced fracture of greater trochanter of left femur, initial encounter for closed fracture: Secondary | ICD-10-CM

## 2019-12-12 NOTE — Progress Notes (Signed)
Office Visit Note   Patient: Russell Werner           Date of Birth: 1955-05-18           MRN: [id number] Visit Date: 12/12/2019              Requested by: Dettinger, Fransisca Kaufmann, MD [place],  [place] [address] PCP: Dettinger, Fransisca Kaufmann, MD   Assessment & Plan: Visit Diagnoses:  1. Closed nondisplaced fracture of greater trochanter of left femur, initial encounter [facility])     Plan: Prescription for knee-high teds 2030 weight he can pick up at the local drugstore.  Continue using his walker for fall prevention.  Weight loss discussed and recommended. The patient meets the AMA guidelines for Morbid (severe) obesity with a BMI > 40.0 and I have recommended weight loss.  Follow-Up Instructions: No follow-ups on file.   Orders:  Orders Placed This Encounter  Procedures   XR HIP UNILAT W OR W/O PELVIS 2-3 VIEWS LEFT   No orders of the defined types were placed in this encounter.     Procedures: No procedures performed   Clinical Data: No additional findings.   Subjective: Chief Complaint  Patient presents with   Left Hip - Fracture, Follow-up    HPI 64 year old male returns for follow-up of left greater trochanteric fracture seen on CT scan only.  He has had heart failure morbid obesity is using a walker.  He still has swelling left lower extremity has been keeping it elevated which helps [employer] but then it swells once he is in a sitting position or walking again.  No falls since last seen.  He does have some cardiomyopathy.  He states the tramadol did not help the pain but CBD Gummies did give him relief and that is what he is using.  Review of Systems 14 point system update unchanged last office visit other than as mentioned HPI.   Objective: Vital Signs: Ht 5\' 10"  (1.778 m)    Wt (!) 305 lb (138.3 kg)    BMI 43.76 kg/m   Physical Exam Constitutional:      Appearance: He is well-developed.  HENT:     Head: Normocephalic and atraumatic.  Eyes:      Pupils: Pupils are equal, round, and reactive to light.  Neck:     Thyroid: No thyromegaly.     Trachea: No tracheal deviation.  Cardiovascular:     Rate and Rhythm: Normal rate.  Pulmonary:     Effort: Pulmonary effort is normal.     Breath sounds: No wheezing.  Abdominal:     General: Bowel sounds are normal.     Palpations: Abdomen is soft.  Skin:    General: Skin is warm and dry.     Capillary Refill: Capillary refill takes less than 2 seconds.  Neurological:     Mental Status: He is alert and oriented to person, place, and time.  Psychiatric:        Behavior: Behavior normal.        Thought Content: Thought content normal.        Judgment: Judgment normal.     Ortho Exam patient is ambulatory with a walker.  Some pain with straight leg raising that radiates down to the ankle.  Good quad strength.  Mild swelling left lower extremity.  Specialty Comments:  No specialty comments available.  Imaging: No results found.   PMFS History: Patient Active Problem List   Diagnosis Date  Noted   Greater trochanter fracture ([place]) 11/14/2019   Hyperlipidemia 07/26/2019   S/P right knee arthroscopy 10/27/2017   Morbid obesity ([place]) 10/12/2017   Nonischemic cardiomyopathy ([place]) 05/03/2017   Obstructive sleep apnea treated with continuous positive airway pressure (CPAP) 04/14/2017   Sleep apnea, obstructive 10/12/2016   CHF (congestive heart failure) ([place]) 10/12/2016   Hypertension 10/12/2016   BPH (benign prostatic hyperplasia) 10/12/2016   History of melanoma 10/12/2016   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   Arthritis    "fingers" (05/03/2017)   CHF (congestive heart failure) (HCC)    COPD (chronic obstructive pulmonary disease) ([place])    Coronary artery disease    nonobstructive mid RCA by 2009 cath at [facility] in [place]   Enlarged prostate    High cholesterol     Hypertension    Melanoma of back ([place]) 2013   Meniscal injury    Myocardial infarction ([place]) 1998   mild   OSA on CPAP    "severe" (05/03/2017)   Right knee meniscal tear     Family History  Problem Relation Age of Onset   Early death Mother    Cancer Sister        breast   HIV Brother    Cancer Maternal Grandmother        breast   Stroke Maternal Grandfather    Cancer Sister        melanoma    Past Surgical History:  Procedure Laterality Date   CARDIAC CATHETERIZATION  X 2   CARDIAC DEFIBRILLATOR PLACEMENT  05/03/2017   CYST REMOVAL LEG Bilateral 2013   HERNIA REPAIR     ICD IMPLANT N/A 05/03/2017   Procedure: ICD IMPLANT;  Surgeon: Thompson Grayer, MD;  Location: [facility];  Service: Cardiovascular;  Laterality: N/A;   KNEE ARTHROSCOPY WITH MEDIAL MENISECTOMY Right 10/18/2017   Procedure: RIGHT KNEE ARTHROSCOPY WITH PARTIAL MEDIAL MENISCECTOMY;  Surgeon: Leandrew Koyanagi, MD;  Location: [facility];  Service: Orthopedics;  Laterality: Right;   KNEE CARTILAGE SURGERY Left 1972   LAPAROSCOPIC CHOLECYSTECTOMY  2015   LAPAROSCOPIC GASTRIC BAND REMOVAL WITH LAPAROSCOPIC GASTRIC SLEEVE RESECTION  11/05/2014   MELANOMA EXCISION  7121   "back"   UMBILICAL HERNIA REPAIR  2014   Social History   Occupational History   Not on file  Tobacco Use   Smoking status: Former Smoker    Packs/day: 1.00    Years: 47.00    Pack years: 47.00    Types: Cigarettes    Start date: 06/24/1967    Quit date: 05/02/2013    Years since quitting: 6.6   Smokeless tobacco: Never Used  Vaping Use   Vaping Use: Every day  Substance and Sexual Activity   Alcohol use: Yes    Comment:  "1-2 glasses of wine/month"   Drug use: No   Sexual activity: Yes

## 2019-12-13 ENCOUNTER — Inpatient Hospital Stay: Payer: Medicare HMO | Admitting: Nurse Practitioner

## 2019-12-19 ENCOUNTER — Other Ambulatory Visit: Payer: Self-pay

## 2019-12-19 ENCOUNTER — Ambulatory Visit (INDEPENDENT_AMBULATORY_CARE_PROVIDER_SITE_OTHER): Payer: Medicare HMO | Admitting: Family Medicine

## 2019-12-19 ENCOUNTER — Encounter: Payer: Self-pay | Admitting: Family Medicine

## 2019-12-19 VITALS — BP 108/68 | HR 58 | Temp 97.8°F | Ht 70.0 in | Wt 328.0 lb

## 2019-12-19 DIAGNOSIS — S72142D Displaced intertrochanteric fracture of left femur, subsequent encounter for closed fracture with routine healing: Secondary | ICD-10-CM | POA: Diagnosis not present

## 2019-12-19 DIAGNOSIS — S72115D Nondisplaced fracture of greater trochanter of left femur, subsequent encounter for closed fracture with routine healing: Secondary | ICD-10-CM

## 2019-12-19 DIAGNOSIS — R609 Edema, unspecified: Secondary | ICD-10-CM

## 2019-12-19 MED ORDER — NAPROXEN 500 MG PO TABS: 500.0000 mg | ORAL_TABLET | Freq: Two times a day (BID) | ORAL | 3 refills | Status: DC

## 2019-12-19 NOTE — Progress Notes (Signed)
BP 108/68   Pulse (!) 58   Temp 97.8 F (36.6 C) (Temporal)   Ht 5\' 10"  (1.778 m)   Wt (!) 328 lb (148.8 kg)   SpO2 93%   BMI 47.06 kg/m    Subjective:   Patient ID: Russell Werner, male    DOB: [birth year], 64 y.o.   MRN: [id number]  HPI: [name] is a 64 y.o. male presenting on 12/19/2019 for Hospitalization Follow-up   HPI Patient is coming in for hospital follow-up for femur fracture in the greater trochanter of the left hip.  He went through some SNF for therapy and he is starting to walk better with a walker although he is still having some pain.  He is currently using CBD Gummies to help with the pain and he says they are working sufficiently.  Patient says the pain in the walking and strength are improving.  Relevant past medical, surgical, family and social history reviewed and updated as indicated. Interim medical history since our last visit reviewed. Allergies and medications reviewed and updated.  Review of Systems  Constitutional: Negative for chills and fever.  Eyes: Negative for discharge.  Respiratory: Negative for shortness of breath and wheezing.   Cardiovascular: Negative for chest pain and leg swelling.  Musculoskeletal: Positive for arthralgias and gait problem. Negative for back pain.  Skin: Negative for rash.  All other systems reviewed and are negative.   Per HPI unless specifically indicated above   Allergies as of 12/19/2019   No Known Allergies     Medication List       Accurate as of December 19, 2019  1:18 PM. If you have any questions, ask your nurse or doctor.        aspirin EC 81 MG tablet Take 81 mg by mouth daily.   bisoprolol 5 MG tablet Commonly known as: ZEBETA Take 1 tablet (5 mg total) by mouth daily.   Entresto 24-26 MG Generic drug: sacubitril-valsartan Take 1 tablet by mouth 2 (two) times daily.   finasteride 5 MG tablet Commonly known as: PROSCAR TAKE 1 TABLET EVERY DAY   methocarbamol 500 MG  tablet Commonly known as: ROBAXIN   multivitamin capsule Take 1 capsule by mouth 2 (two) times daily.   rosuvastatin 20 MG tablet Commonly known as: CRESTOR TAKE 1 TABLET EVERY DAY   spironolactone 25 MG tablet Commonly known as: ALDACTONE Take 1 tablet (25 mg total) by mouth daily. Needs to be seen before next refill   traMADol 50 MG tablet Commonly known as: ULTRAM Take 1 tablet (50 mg total) by mouth every 8 (eight) hours as needed.   traZODone 50 MG tablet Commonly known as: DESYREL   vitamin B-12 500 MCG tablet Commonly known as: CYANOCOBALAMIN Take 500 mcg by mouth daily.   vitamin C 1000 MG tablet Take 2,000 mg by mouth daily.   VITAMIN D PO Take 1 tablet by mouth daily.        Objective:   There were no vitals taken for this visit.  Wt Readings from Last 3 Encounters:  12/12/19 (!) 305 lb (138.3 kg)  11/14/19 (!) 305 lb (138.3 kg)  08/16/19 (!) 305 lb (138.3 kg)    Physical Exam Vitals and nursing note reviewed.  Constitutional:      General: He is not in acute distress.    Appearance: He is well-developed. He is not diaphoretic.  Skin:    General: Skin is warm and dry.     Findings:  No rash.  Neurological:     Mental Status: He is alert and oriented to person, place, and time.     Coordination: Coordination normal.  Psychiatric:        Behavior: Behavior normal.       Assessment & Plan:   Problem List Items Addressed This Visit      Musculoskeletal and Integument   Greater trochanter fracture (HCC)   Relevant Medications   naproxen (NAPROSYN) 500 MG tablet   Other Relevant Orders   Ambulatory referral to Physical Therapy    Other Visit Diagnoses    Closed displaced intertrochanteric fracture of left femur with routine healing, subsequent encounter    -  Primary   Relevant Medications   naproxen (NAPROSYN) 500 MG tablet   Other Relevant Orders   Ambulatory referral to Physical Therapy   Peripheral edema       Relevant Orders    Compression stockings       Follow up plan: Return if symptoms worsen or fail to improve.  Counseling provided for all of the vaccine components No orders of the defined types were placed in this encounter.   Caryl Pina, MD [facility] 12/19/2019, 1:18 PM

## 2019-12-27 ENCOUNTER — Telehealth: Payer: Self-pay | Admitting: Family Medicine

## 2020-01-02 ENCOUNTER — Telehealth: Payer: Self-pay

## 2020-01-02 NOTE — Telephone Encounter (Signed)
I called pt about missed transmission. He agreed to do one today.

## 2020-01-03 NOTE — Progress Notes (Signed)
No ICM remote transmission received for 12/30/2019 and next ICM transmission scheduled for 01/14/2020.

## 2020-01-04 ENCOUNTER — Other Ambulatory Visit: Payer: Self-pay | Admitting: Family Medicine

## 2020-01-14 ENCOUNTER — Telehealth: Payer: Self-pay

## 2020-01-14 NOTE — Telephone Encounter (Signed)
ICM call to patient.  Advised device monitor has not been connected for >30 days which means there will not be any alerts that will be sent if he has any problems with his heart or device.  He is temporarily living in an area for next 6 months that has very little reception which is causing the problem.  He will go to a friend's house next week and try to send remote transmission. Remote transmission rescheduled from 01/14/2020 to 01/27/2020.

## 2020-01-27 ENCOUNTER — Other Ambulatory Visit: Payer: Self-pay

## 2020-01-27 ENCOUNTER — Ambulatory Visit (INDEPENDENT_AMBULATORY_CARE_PROVIDER_SITE_OTHER): Payer: Medicare HMO | Admitting: Family Medicine

## 2020-01-27 ENCOUNTER — Encounter: Payer: Self-pay | Admitting: Family Medicine

## 2020-01-27 VITALS — BP 134/72 | HR 63 | Temp 98.3°F | Ht 70.0 in | Wt 330.0 lb

## 2020-01-27 DIAGNOSIS — R3914 Feeling of incomplete bladder emptying: Secondary | ICD-10-CM | POA: Diagnosis not present

## 2020-01-27 DIAGNOSIS — I10 Essential (primary) hypertension: Secondary | ICD-10-CM | POA: Diagnosis not present

## 2020-01-27 DIAGNOSIS — I5042 Chronic combined systolic (congestive) and diastolic (congestive) heart failure: Secondary | ICD-10-CM | POA: Diagnosis not present

## 2020-01-27 DIAGNOSIS — E782 Mixed hyperlipidemia: Secondary | ICD-10-CM | POA: Diagnosis not present

## 2020-01-27 DIAGNOSIS — N401 Enlarged prostate with lower urinary tract symptoms: Secondary | ICD-10-CM | POA: Diagnosis not present

## 2020-01-27 NOTE — Progress Notes (Signed)
BP 134/72   Pulse 63   Temp 98.3 F (36.8 C)   Ht '5\' 10"'  (1.778 m)   Wt (!) 330 lb (149.7 kg)   SpO2 96%   BMI 47.35 kg/m    Subjective:   Patient ID: Russell Werner, male    DOB: [birth year], 64 y.o.   MRN: [id number]  HPI: Russell SEEBERGER is a 64 y.o. male presenting on 01/27/2020 for Medical Management of Chronic Issues and Hypertension   HPI Hypertension and CHF, follows cardiology Patient is currently on bisoprolol and Entresto and spironolactone, and their blood pressure today is 134/72. Patient denies any lightheadedness or dizziness. Patient denies headaches, blurred vision, chest pains, shortness of breath, or weakness. Denies any side effects from medication and is content with current medication.   Hyperlipidemia Patient is coming in for recheck of his hyperlipidemia. The patient is currently taking Crestor. They deny any issues with myalgias or history of liver damage from it. They deny any focal numbness or weakness or chest pain.   BPH Patient is coming in for recheck on BPH Symptoms: None currently Medication: Finasteride Last PSA: 6 months ago, was normal  Relevant past medical, surgical, family and social history reviewed and updated as indicated. Interim medical history since our last visit reviewed. Allergies and medications reviewed and updated.  Review of Systems  Constitutional: Negative for chills and fever.  Eyes: Negative for discharge.  Respiratory: Negative for shortness of breath and wheezing.   Cardiovascular: Positive for leg swelling (He gets the occasional leg swelling but none today). Negative for chest pain.  Musculoskeletal: Negative for back pain and gait problem.  Skin: Negative for rash.  Neurological: Negative for dizziness, weakness and light-headedness.  All other systems reviewed and are negative.   Per HPI unless specifically indicated above   Allergies as of 01/27/2020   No Known Allergies     Medication List        Accurate as of January 27, 2020  8:09 AM. If you have any questions, ask your nurse or doctor.        STOP taking these medications   methocarbamol 500 MG tablet Commonly known as: ROBAXIN Stopped by: Worthy Rancher, MD   traMADol 50 MG tablet Commonly known as: ULTRAM Stopped by: Fransisca Kaufmann Paulita Licklider, MD     TAKE these medications   aspirin EC 81 MG tablet Take 81 mg by mouth daily.   bisoprolol 5 MG tablet Commonly known as: ZEBETA Take 1 tablet (5 mg total) by mouth daily.   Entresto 24-26 MG Generic drug: sacubitril-valsartan Take 1 tablet by mouth 2 (two) times daily.   finasteride 5 MG tablet Commonly known as: PROSCAR TAKE 1 TABLET EVERY DAY   multivitamin capsule Take 1 capsule by mouth 2 (two) times daily.   naproxen 500 MG tablet Commonly known as: Naprosyn Take 1 tablet (500 mg total) by mouth 2 (two) times daily with a meal.   rosuvastatin 20 MG tablet Commonly known as: CRESTOR TAKE 1 TABLET EVERY DAY   spironolactone 25 MG tablet Commonly known as: ALDACTONE Take 1 tablet (25 mg total) by mouth daily. Needs to be seen before next refill   traZODone 50 MG tablet Commonly known as: DESYREL   vitamin B-12 500 MCG tablet Commonly known as: CYANOCOBALAMIN Take 500 mcg by mouth daily.   vitamin C 1000 MG tablet Take 2,000 mg by mouth daily.   VITAMIN D PO Take 1 tablet by mouth daily.  Objective:   BP 134/72   Pulse 63   Temp 98.3 F (36.8 C)   Ht '5\' 10"'  (1.778 m)   Wt (!) 330 lb (149.7 kg)   SpO2 96%   BMI 47.35 kg/m   Wt Readings from Last 3 Encounters:  01/27/20 (!) 330 lb (149.7 kg)  12/19/19 (!) 328 lb (148.8 kg)  12/12/19 (!) 305 lb (138.3 kg)    Physical Exam Vitals and nursing note reviewed.  Constitutional:      General: He is not in acute distress.    Appearance: He is well-developed. He is not diaphoretic.  Eyes:     General: No scleral icterus.    Conjunctiva/sclera: Conjunctivae normal.  Neck:      Thyroid: No thyromegaly.  Cardiovascular:     Rate and Rhythm: Normal rate and regular rhythm.     Heart sounds: Normal heart sounds. No murmur heard.   Pulmonary:     Effort: Pulmonary effort is normal. No respiratory distress.     Breath sounds: Normal breath sounds. No wheezing.  Musculoskeletal:        General: Normal range of motion.     Cervical back: Neck supple.  Lymphadenopathy:     Cervical: No cervical adenopathy.  Skin:    General: Skin is warm and dry.     Findings: No rash.  Neurological:     Mental Status: He is alert and oriented to person, place, and time.     Coordination: Coordination normal.  Psychiatric:        Behavior: Behavior normal.       Assessment & Plan:   Problem List Items Addressed This Visit      Cardiovascular and Mediastinum   CHF (congestive heart failure) ([place])   Relevant Orders   CBC with Differential/Platelet   Hypertension - Primary   Relevant Orders   CBC with Differential/Platelet   CMP14+EGFR     Genitourinary   BPH (benign prostatic hyperplasia)     Other   Hyperlipidemia   Relevant Orders   Lipid panel      Continue current medication, seems to be doing well, will send blood work copy to cardiologist.  Patient did not need any medication refills, per him he says he has multiple bottles of each and will call when he needs some. Follow up plan: Return in about 6 months (around 07/26/2020), or if symptoms worsen or fail to improve, for CHF and hypertension.  Counseling provided for all of the vaccine components No orders of the defined types were placed in this encounter.   Caryl Pina, MD [facility] 01/27/2020, 8:09 AM

## 2020-01-28 DIAGNOSIS — M2352 Chronic instability of knee, left knee: Secondary | ICD-10-CM | POA: Diagnosis not present

## 2020-01-28 DIAGNOSIS — S72115D Nondisplaced fracture of greater trochanter of left femur, subsequent encounter for closed fracture with routine healing: Secondary | ICD-10-CM | POA: Diagnosis not present

## 2020-01-28 DIAGNOSIS — S72142D Displaced intertrochanteric fracture of left femur, subsequent encounter for closed fracture with routine healing: Secondary | ICD-10-CM | POA: Diagnosis not present

## 2020-01-28 DIAGNOSIS — M25562 Pain in left knee: Secondary | ICD-10-CM | POA: Diagnosis not present

## 2020-01-28 LAB — CBC WITH DIFFERENTIAL/PLATELET
Basophils Absolute: 0.1 10*3/uL (ref 0.0–0.2)
Basos: 1 %
EOS (ABSOLUTE): 0.1 10*3/uL (ref 0.0–0.4)
Eos: 2 %
Hematocrit: 39.9 % (ref 37.5–51.0)
Hemoglobin: 13.1 g/dL (ref 13.0–17.7)
Immature Grans (Abs): 0 10*3/uL (ref 0.0–0.1)
Immature Granulocytes: 1 %
Lymphocytes Absolute: 1.5 10*3/uL (ref 0.7–3.1)
Lymphs: 21 %
MCH: 30.4 pg (ref 26.6–33.0)
MCHC: 32.8 g/dL (ref 31.5–35.7)
MCV: 93 fL (ref 79–97)
Monocytes Absolute: 0.5 10*3/uL (ref 0.1–0.9)
Monocytes: 7 %
Neutrophils Absolute: 5 10*3/uL (ref 1.4–7.0)
Neutrophils: 68 %
Platelets: 175 10*3/uL (ref 150–450)
RBC: 4.31 x10E6/uL (ref 4.14–5.80)
RDW: 12.9 % (ref 11.6–15.4)
WBC: 7.3 10*3/uL (ref 3.4–10.8)

## 2020-01-28 LAB — CMP14+EGFR
ALT: 21 IU/L (ref 0–44)
AST: 14 IU/L (ref 0–40)
Albumin/Globulin Ratio: 1.7 (ref 1.2–2.2)
Albumin: 3.9 g/dL (ref 3.8–4.8)
Alkaline Phosphatase: 73 IU/L (ref 44–121)
BUN/Creatinine Ratio: 16 (ref 10–24)
BUN: 12 mg/dL (ref 8–27)
Bilirubin Total: 0.3 mg/dL (ref 0.0–1.2)
CO2: 29 mmol/L (ref 20–29)
Calcium: 9 mg/dL (ref 8.6–10.2)
Chloride: 102 mmol/L (ref 96–106)
Creatinine, Ser: 0.75 mg/dL — ABNORMAL LOW (ref 0.76–1.27)
GFR calc Af Amer: 111 mL/min/{1.73_m2} (ref >59)
GFR calc non Af Amer: 96 mL/min/{1.73_m2} (ref >59)
Globulin, Total: 2.3 g/dL (ref 1.5–4.5)
Glucose: 127 mg/dL — ABNORMAL HIGH (ref 65–99)
Potassium: 4.6 mmol/L (ref 3.5–5.2)
Sodium: 143 mmol/L (ref 134–144)
Total Protein: 6.2 g/dL (ref 6.0–8.5)

## 2020-01-28 LAB — LIPID PANEL
Chol/HDL Ratio: 3.4 ratio (ref 0.0–5.0)
Cholesterol, Total: 196 mg/dL (ref 100–199)
HDL: 58 mg/dL (ref >39)
LDL Chol Calc (NIH): 110 mg/dL — ABNORMAL HIGH (ref 0–99)
Triglycerides: 163 mg/dL — ABNORMAL HIGH (ref 0–149)
VLDL Cholesterol Cal: 28 mg/dL (ref 5–40)

## 2020-01-29 ENCOUNTER — Ambulatory Visit: Payer: Medicare HMO | Admitting: Orthopaedic Surgery

## 2020-01-29 NOTE — Progress Notes (Signed)
No ICM remote transmission received for 01/27/2020 and next ICM transmission scheduled for 03/02/2020.

## 2020-01-30 ENCOUNTER — Ambulatory Visit (INDEPENDENT_AMBULATORY_CARE_PROVIDER_SITE_OTHER): Payer: Medicare HMO | Admitting: Orthopaedic Surgery

## 2020-01-30 ENCOUNTER — Ambulatory Visit: Payer: Self-pay

## 2020-01-30 ENCOUNTER — Other Ambulatory Visit: Payer: Self-pay

## 2020-01-30 VITALS — BP 141/62 | HR 81

## 2020-01-30 DIAGNOSIS — M25562 Pain in left knee: Secondary | ICD-10-CM | POA: Diagnosis not present

## 2020-01-30 DIAGNOSIS — M1711 Unilateral primary osteoarthritis, right knee: Secondary | ICD-10-CM | POA: Diagnosis not present

## 2020-01-30 NOTE — Progress Notes (Signed)
Office Visit Note   Patient: [place] Werner           Date of Birth: 07-Apr-1956           MRN: [id number] Visit Date: 01/30/2020              Requested by: Dettinger, Fransisca Kaufmann, MD [place],  [place] [address] PCP: Dettinger, Fransisca Kaufmann, MD   Assessment & Plan: Visit Diagnoses:  1. Left knee pain, unspecified chronicity   2. Unilateral primary osteoarthritis, right knee   3. Morbid obesity ([place])     Plan: We discussed his morbid obesity.  Weight loss is recommended.  He would need to get his weight to 278 to be BMI less than 40.  He will work on this I will recheck him in 1 month.  We discussed possible intra-articular injection he states he had it before it did help he still having to use a cane ambulate and states and feels well he can ambulate without the cane.  Follow-Up Instructions: Return in about 1 month (around 02/29/2020).   Orders:  Orders Placed This Encounter  Procedures  . XR Knee 1-2 Views Left   No orders of the defined types were placed in this encounter.     Procedures: No procedures performed   Clinical Data: No additional findings.   Subjective: Chief Complaint  Patient presents with  . Left Knee - Pain    HPI  64 year old male with past history years ago open meniscectomy in the 70s with follow-up [facility] in the past with increased left knee pain great difficulty walking.  He states his knee is catching has pain anteriorly.  He is having to ambulate with a cane.  He states because of increased pain and decreased mobility he has gained weight.  Review of Systems positive hypertension negative for diabetes past history of heart failure sleep apnea morbid obesity hyperlipidemia. Left  Greater trochanter fracture in August now doing well.   Objective: Vital Signs: BP (!) 141/62   Pulse 81   Physical Exam Constitutional:      Appearance: He is well-developed.  HENT:     Head: Normocephalic and atraumatic.  Eyes:     Pupils: Pupils  are equal, round, and reactive to light.  Neck:     Thyroid: No thyromegaly.     Trachea: No tracheal deviation.  Cardiovascular:     Rate and Rhythm: Normal rate.  Pulmonary:     Effort: Pulmonary effort is normal.     Breath sounds: No wheezing.  Abdominal:     General: Bowel sounds are normal.     Palpations: Abdomen is soft.  Skin:    General: Skin is warm and dry.     Capillary Refill: Capillary refill takes less than 2 seconds.  Neurological:     Mental Status: He is alert and oriented to person, place, and time.  Psychiatric:        Behavior: Behavior normal.        Thought Content: Thought content normal.        Judgment: Judgment normal.     Ortho Exam negative logroll hips patient is amatory with a left lower extremity limp.  Crepitus with left knee range of motion and 10 degree varus deformity left knee.  Mild crepitus right knee distal pulses palpable.  Negative logroll the hips. Specialty Comments:  No specialty comments available.  Imaging: XR Knee 1-2 Views Left  Result Date: 01/30/2020 Standing  AP both knees lateral left knee obtained and reviewed this shows bone-on-bone medial compartment marginal osteophyte some shifting femur medially on the tibia 5 mm.  Minimal joint line narrowing right knee medial compartment.  Left knee shows tricompartmental degenerative changes. Impression: Severe left knee osteoarthritis with bone-on-bone medial compartment.    PMFS History: Patient Active Problem List   Diagnosis Date Noted  . Unilateral primary osteoarthritis, right knee 01/30/2020  . Greater trochanter fracture ([place]) 11/14/2019  . Hyperlipidemia 07/26/2019  . S/P right knee arthroscopy 10/27/2017  . Morbid obesity ([place]) 10/12/2017  . Nonischemic cardiomyopathy ([place]) 05/03/2017  . Obstructive sleep apnea treated with continuous positive airway pressure (CPAP) 04/14/2017  . Sleep apnea, obstructive 10/12/2016  . CHF (congestive heart failure) ([place]) 10/12/2016    . Hypertension 10/12/2016  . BPH (benign prostatic hyperplasia) 10/12/2016  . History of melanoma 10/12/2016   Past Medical History:  Diagnosis Date  . 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  . Arthritis    "fingers" (05/03/2017)  . CHF (congestive heart failure) ([place])   . COPD (chronic obstructive pulmonary disease) ([place])   . Coronary artery disease    nonobstructive mid RCA by 2009 cath at [facility] in [place]  . Enlarged prostate   . High cholesterol   . Hypertension   . Melanoma of back ([place]) 2013  . Meniscal injury   . Myocardial infarction ([place]) 1998   mild  . OSA on CPAP    "severe" (05/03/2017)  . Right knee meniscal tear     Family History  Problem Relation Age of Onset  . Early death Mother   . Cancer Sister        breast  . HIV Brother   . Cancer Maternal Grandmother        breast  . Stroke Maternal Grandfather   . Cancer Sister        melanoma    Past Surgical History:  Procedure Laterality Date  . CARDIAC CATHETERIZATION  X 2  . CARDIAC DEFIBRILLATOR PLACEMENT  05/03/2017  . CYST REMOVAL LEG Bilateral 2013  . HERNIA REPAIR    . ICD IMPLANT N/A 05/03/2017   Procedure: ICD IMPLANT;  Surgeon: Thompson Grayer, MD;  Location: [facility];  Service: Cardiovascular;  Laterality: N/A;  . KNEE ARTHROSCOPY WITH MEDIAL MENISECTOMY Right 10/18/2017   Procedure: RIGHT KNEE ARTHROSCOPY WITH PARTIAL MEDIAL MENISCECTOMY;  Surgeon: Leandrew Koyanagi, MD;  Location: [facility];  Service: Orthopedics;  Laterality: Right;  . KNEE CARTILAGE SURGERY Left 1972  . LAPAROSCOPIC CHOLECYSTECTOMY  2015  . LAPAROSCOPIC GASTRIC BAND REMOVAL WITH LAPAROSCOPIC GASTRIC SLEEVE RESECTION  11/05/2014  . MELANOMA EXCISION  2013   "back"  . UMBILICAL HERNIA REPAIR  2014   Social History   Occupational History  . Not on file  Tobacco Use  . Smoking status: Former Smoker    Packs/day: 1.00    Years: 47.00    Pack years: 47.00    Types: Cigarettes     Start date: 06/24/1967    Quit date: 05/02/2013    Years since quitting: 6.7  . Smokeless tobacco: Never Used  Vaping Use  . Vaping Use: Every day  Substance and Sexual Activity  . Alcohol use: Yes    Comment:  "1-2 glasses of wine/month"  . Drug use: No  . Sexual activity: Yes

## 2020-02-04 DIAGNOSIS — M2352 Chronic instability of knee, left knee: Secondary | ICD-10-CM | POA: Diagnosis not present

## 2020-02-04 DIAGNOSIS — S72142D Displaced intertrochanteric fracture of left femur, subsequent encounter for closed fracture with routine healing: Secondary | ICD-10-CM | POA: Diagnosis not present

## 2020-02-04 DIAGNOSIS — S72115D Nondisplaced fracture of greater trochanter of left femur, subsequent encounter for closed fracture with routine healing: Secondary | ICD-10-CM | POA: Diagnosis not present

## 2020-02-04 DIAGNOSIS — M25562 Pain in left knee: Secondary | ICD-10-CM | POA: Diagnosis not present

## 2020-02-14 ENCOUNTER — Telehealth: Payer: Self-pay | Admitting: Radiology

## 2020-02-14 DIAGNOSIS — M25562 Pain in left knee: Secondary | ICD-10-CM | POA: Diagnosis not present

## 2020-02-14 DIAGNOSIS — S72115D Nondisplaced fracture of greater trochanter of left femur, subsequent encounter for closed fracture with routine healing: Secondary | ICD-10-CM | POA: Diagnosis not present

## 2020-02-14 DIAGNOSIS — M2352 Chronic instability of knee, left knee: Secondary | ICD-10-CM | POA: Diagnosis not present

## 2020-02-14 DIAGNOSIS — S72142D Displaced intertrochanteric fracture of left femur, subsequent encounter for closed fracture with routine healing: Secondary | ICD-10-CM | POA: Diagnosis not present

## 2020-02-14 NOTE — Telephone Encounter (Signed)
Continuing PT is fine thanks

## 2020-02-14 NOTE — Telephone Encounter (Signed)
Patient would like to know if you want him to continue physical therapy until he has surgery?  He is going once a week. Please advise.  CB for patient is [phone]

## 2020-02-14 NOTE — Telephone Encounter (Signed)
I called patient and advised. 

## 2020-02-26 ENCOUNTER — Other Ambulatory Visit: Payer: Self-pay

## 2020-02-26 ENCOUNTER — Encounter: Payer: Self-pay | Admitting: Family Medicine

## 2020-02-26 ENCOUNTER — Other Ambulatory Visit: Payer: Self-pay | Admitting: Internal Medicine

## 2020-02-26 ENCOUNTER — Ambulatory Visit (INDEPENDENT_AMBULATORY_CARE_PROVIDER_SITE_OTHER): Payer: Medicare HMO | Admitting: Family Medicine

## 2020-02-26 NOTE — Progress Notes (Signed)
BP 120/74   Pulse 66   Temp (!) 97 F (36.1 C)   Ht 5\' 10"  (1.778 m)   Wt (!) 326 lb (147.9 kg)   SpO2 95%   BMI 46.78 kg/m    Subjective:   Patient ID: [place] Werner, male    DOB: Mar 13, 1956, 64 y.o.   MRN: [id number]  HPI: [place] Werner is a 64 y.o. male presenting on 02/26/2020 for Medical Management of Chronic Issues and Hypertension   HPI Patient is coming in today because he would like to do bariatric surgery and get a referral for the.  He has been having trouble gaining weight and that has definitely not helped that he is trying to lose weight so he can get a knee replacement surgery.  Because of his knee buckling and giving out he cannot get up and move around as easily.  Patient says he does have a history of in [place] having a bariatric sleeve placed but he said he did not do the right step at that time and if failed but he does know what to do when he wants to try to do bariatric surgery again to help him get some weight loss.  Relevant past medical, surgical, family and social history reviewed and updated as indicated. Interim medical history since our last visit reviewed. Allergies and medications reviewed and updated.  Review of Systems  Constitutional: Negative for activity change, appetite change, chills and fever.  Eyes: Negative for visual disturbance.  Respiratory: Negative for shortness of breath and wheezing.   Cardiovascular: Negative for chest pain and leg swelling.  Musculoskeletal: Positive for arthralgias. Negative for back pain and gait problem.  Skin: Negative for rash.  All other systems reviewed and are negative.   Per HPI unless specifically indicated above   Allergies as of 02/26/2020   No Known Allergies     Medication List       Accurate as of February 26, 2020  1:05 PM. If you have any questions, ask your nurse or doctor.        aspirin EC 81 MG tablet Take 81 mg by mouth daily.   bisoprolol 5 MG tablet Commonly known as:  ZEBETA Take 1 tablet (5 mg total) by mouth daily.   Entresto 24-26 MG Generic drug: sacubitril-valsartan Take 1 tablet by mouth 2 (two) times daily.   finasteride 5 MG tablet Commonly known as: PROSCAR TAKE 1 TABLET EVERY DAY   multivitamin capsule Take 1 capsule by mouth 2 (two) times daily.   naproxen 500 MG tablet Commonly known as: Naprosyn Take 1 tablet (500 mg total) by mouth 2 (two) times daily with a meal.   rosuvastatin 20 MG tablet Commonly known as: CRESTOR TAKE 1 TABLET EVERY DAY   spironolactone 25 MG tablet Commonly known as: ALDACTONE Take 1 tablet (25 mg total) by mouth daily. Needs to be seen before next refill   traZODone 50 MG tablet Commonly known as: DESYREL   vitamin B-12 500 MCG tablet Commonly known as: CYANOCOBALAMIN Take 500 mcg by mouth daily.   vitamin C 1000 MG tablet Take 2,000 mg by mouth daily.   VITAMIN D PO Take 1 tablet by mouth daily.        Objective:   Ht 5\' 10"  (1.778 m)   Wt (!) 326 lb (147.9 kg)   BMI 46.78 kg/m   Wt Readings from Last 3 Encounters:  02/26/20 (!) 326 lb (147.9 kg)  01/27/20 (!) 330 lb (149.7  kg)  12/19/19 (!) 328 lb (148.8 kg)    Physical Exam Vitals and nursing note reviewed.  Constitutional:      General: He is not in acute distress.    Appearance: He is well-developed. He is not diaphoretic.  Eyes:     General: No scleral icterus.    Conjunctiva/sclera: Conjunctivae normal.  Musculoskeletal:        General: Normal range of motion.  Skin:    General: Skin is warm and dry.     Findings: No rash.  Neurological:     Mental Status: He is alert and oriented to person, place, and time.     Coordination: Coordination normal.  Psychiatric:        Behavior: Behavior normal.       Assessment & Plan:   Problem List Items Addressed This Visit      Other   Morbid obesity ([place]) - Primary   Relevant Orders   Amb Referral to Bariatric Surgery       Follow up plan: Return if symptoms  worsen or fail to improve.  Counseling provided for all of the vaccine components No orders of the defined types were placed in this encounter.   Caryl Pina, MD [facility] 02/26/2020, 1:05 PM

## 2020-02-27 ENCOUNTER — Ambulatory Visit (INDEPENDENT_AMBULATORY_CARE_PROVIDER_SITE_OTHER): Payer: Medicare HMO | Admitting: Orthopaedic Surgery

## 2020-02-27 ENCOUNTER — Encounter: Payer: Self-pay | Admitting: Orthopaedic Surgery

## 2020-02-27 DIAGNOSIS — M1712 Unilateral primary osteoarthritis, left knee: Secondary | ICD-10-CM | POA: Diagnosis not present

## 2020-02-27 NOTE — Progress Notes (Signed)
Office Visit Note   Patient: [place] Werner           Date of Birth: 12-02-1955           MRN: [id number] Visit Date: 02/27/2020              Requested by: Dettinger, Fransisca Kaufmann, MD [place],  [place] [address] PCP: Dettinger, Fransisca Kaufmann, MD   Assessment & Plan: Visit Diagnoses:  1. Morbid obesity ([place])   2. Unilateral primary osteoarthritis, left knee     Plan: Patient will make an appointment to see the bariatric surgeons in [facility] to discuss bariatric surgery.  His knee is getting worse she is having great trouble walking and states he is concerned if his knee gets bad enough is not been to be able to ambulate.  He states he is very committed to following appropriate diet and only things exposed to.  Previously was losing 1 to 2 pounds a week.  Now with his bed knee he states he can exercise enough to get his weight down to what needs to be with his BMI of 40.  I plan to recheck him in 2 months.  Follow-Up Instructions: Return in about 2 months (around 04/28/2020).   Orders:  No orders of the defined types were placed in this encounter.  No orders of the defined types were placed in this encounter.     Procedures: No procedures performed   Clinical Data: No additional findings.   Subjective: Chief Complaint  Patient presents with  . Left Knee - Follow-up    HPI 64 year old male returns for follow-up with ongoing problems with his left knee.  He states he is lost 6pounds but BMI is 46 and he has talked with Dr. Warrick Parisian about referral to the referral surgeons in [place].  He had previous gastric sleeve.  He states he is doing well until he started eating some things he should not and gradually gained weight again.  He has significant knee arthritis and needs to get his BMI below 40 to be a candidate for total knee arthroplasty with his current insurance plan.  In addition patient has problems with hypertension hyperlipidemia.  History of heart failure and  some sleep apnea.  Sleep apnea is better when his weight was down.  Review of Systems all other systems are noncontributory to HPI.   Objective: Vital Signs: Ht 5\' 10"  (1.778 m)   Wt (!) 326 lb (147.9 kg)   BMI 46.78 kg/m   Physical Exam Constitutional:      Appearance: He is well-developed.  HENT:     Head: Normocephalic and atraumatic.  Eyes:     Pupils: Pupils are equal, round, and reactive to light.  Neck:     Thyroid: No thyromegaly.     Trachea: No tracheal deviation.  Cardiovascular:     Rate and Rhythm: Normal rate.  Pulmonary:     Effort: Pulmonary effort is normal.     Breath sounds: No wheezing.  Abdominal:     General: Bowel sounds are normal.     Palpations: Abdomen is soft.  Skin:    General: Skin is warm and dry.     Capillary Refill: Capillary refill takes less than 2 seconds.  Neurological:     Mental Status: He is alert and oriented to person, place, and time.  Psychiatric:        Behavior: Behavior normal.        Thought Content: Thought  content normal.        Judgment: Judgment normal.     Ortho Exam well-healed medial arthrotomy scar left knee.  Bilateral crepitus.  Patient amatory left knee limp.  10 degrees varus left knee.  Less crepitus right knee.  Distal pulses are palpable negative logroll of the hip sensation of the foot is intact.  Specialty Comments:  No specialty comments available.  Imaging: No results found.   PMFS History: Patient Active Problem List   Diagnosis Date Noted  . Unilateral primary osteoarthritis, left knee 02/27/2020  . Unilateral primary osteoarthritis, right knee 01/30/2020  . Greater trochanter fracture ([place]) 11/14/2019  . Hyperlipidemia 07/26/2019  . S/P right knee arthroscopy 10/27/2017  . Morbid obesity ([place]) 10/12/2017  . Nonischemic cardiomyopathy ([place]) 05/03/2017  . Obstructive sleep apnea treated with continuous positive airway pressure (CPAP) 04/14/2017  . Sleep apnea, obstructive 10/12/2016  .  CHF (congestive heart failure) ([place]) 10/12/2016  . Hypertension 10/12/2016  . BPH (benign prostatic hyperplasia) 10/12/2016  . History of melanoma 10/12/2016   Past Medical History:  Diagnosis Date  . 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  . Arthritis    "fingers" (05/03/2017)  . CHF (congestive heart failure) ([place])   . COPD (chronic obstructive pulmonary disease) ([place])   . Coronary artery disease    nonobstructive mid RCA by 2009 cath at [facility] in [place]  . Enlarged prostate   . High cholesterol   . Hypertension   . Melanoma of back ([place]) 2013  . Meniscal injury   . Myocardial infarction ([place]) 1998   mild  . OSA on CPAP    "severe" (05/03/2017)  . Right knee meniscal tear     Family History  Problem Relation Age of Onset  . Early death Mother   . Cancer Sister        breast  . HIV Brother   . Cancer Maternal Grandmother        breast  . Stroke Maternal Grandfather   . Cancer Sister        melanoma    Past Surgical History:  Procedure Laterality Date  . CARDIAC CATHETERIZATION  X 2  . CARDIAC DEFIBRILLATOR PLACEMENT  05/03/2017  . CYST REMOVAL LEG Bilateral 2013  . HERNIA REPAIR    . ICD IMPLANT N/A 05/03/2017   Procedure: ICD IMPLANT;  Surgeon: Thompson Grayer, MD;  Location: [facility];  Service: Cardiovascular;  Laterality: N/A;  . KNEE ARTHROSCOPY WITH MEDIAL MENISECTOMY Right 10/18/2017   Procedure: RIGHT KNEE ARTHROSCOPY WITH PARTIAL MEDIAL MENISCECTOMY;  Surgeon: Leandrew Koyanagi, MD;  Location: [facility];  Service: Orthopedics;  Laterality: Right;  . KNEE CARTILAGE SURGERY Left 1972  . LAPAROSCOPIC CHOLECYSTECTOMY  2015  . LAPAROSCOPIC GASTRIC BAND REMOVAL WITH LAPAROSCOPIC GASTRIC SLEEVE RESECTION  11/05/2014  . MELANOMA EXCISION  2013   "back"  . UMBILICAL HERNIA REPAIR  2014   Social History   Occupational History  . Not on file  Tobacco Use  . Smoking status: Former Smoker    Packs/day: 1.00    Years: 47.00     Pack years: 47.00    Types: Cigarettes    Start date: 06/24/1967    Quit date: 05/02/2013    Years since quitting: 6.8  . Smokeless tobacco: Never Used  Vaping Use  . Vaping Use: Every day  Substance and Sexual Activity  . Alcohol use: Yes    Comment:  "1-2 glasses of wine/month"  . Drug use: No  .  Sexual activity: Yes

## 2020-03-03 ENCOUNTER — Telehealth: Payer: Self-pay | Admitting: Radiology

## 2020-03-03 NOTE — Telephone Encounter (Signed)
Patient left message at [facility] office requesting his medical records be faxed to [facility] in [place]. The fax number is [phone].  CB for patient is [phone]

## 2020-03-04 NOTE — Telephone Encounter (Signed)
Ic,lmvm, advised need to complete and sign release or records. Advised he can go to the [facility] clinic and complete this form. Once we received his release form, we can send out his records.

## 2020-03-05 ENCOUNTER — Telehealth: Payer: Self-pay

## 2020-03-05 ENCOUNTER — Other Ambulatory Visit: Payer: Self-pay | Admitting: Family Medicine

## 2020-03-05 NOTE — Telephone Encounter (Signed)
The pt states he have bad signal where he lives. He will try to go into town and send the transmission with his home monitor.

## 2020-03-09 NOTE — Progress Notes (Signed)
No ICM remote transmission received for 03/02/2020 and next ICM transmission scheduled for 04/07/2020.

## 2020-03-20 ENCOUNTER — Other Ambulatory Visit: Payer: Self-pay | Admitting: Family Medicine

## 2020-03-25 ENCOUNTER — Ambulatory Visit (INDEPENDENT_AMBULATORY_CARE_PROVIDER_SITE_OTHER): Payer: Medicare PPO

## 2020-03-25 DIAGNOSIS — I5022 Chronic systolic (congestive) heart failure: Secondary | ICD-10-CM

## 2020-03-25 DIAGNOSIS — I428 Other cardiomyopathies: Secondary | ICD-10-CM

## 2020-03-26 LAB — CUP PACEART REMOTE DEVICE CHECK
Battery Remaining Longevity: 82 mo
Battery Remaining Percentage: 74 %
Battery Voltage: 2.99 V
Brady Statistic RV Percent Paced: 1 %
Date Time Interrogation Session: 20211123173234
HighPow Impedance: 72 Ohm
HighPow Impedance: 72 Ohm
Implantable Lead Implant Date: 20190102
Implantable Lead Location: 753860
Implantable Pulse Generator Implant Date: 20190102
Lead Channel Impedance Value: 650 Ohm
Lead Channel Pacing Threshold Amplitude: 0.5 V
Lead Channel Pacing Threshold Pulse Width: 0.5 ms
Lead Channel Sensing Intrinsic Amplitude: 11.5 mV
Lead Channel Setting Pacing Amplitude: 2.5 V
Lead Channel Setting Pacing Pulse Width: 0.5 ms
Lead Channel Setting Sensing Sensitivity: 0.5 mV
Pulse Gen Serial Number: 9786935

## 2020-03-31 ENCOUNTER — Other Ambulatory Visit: Payer: Self-pay | Admitting: *Deleted

## 2020-03-31 DIAGNOSIS — I5042 Chronic combined systolic (congestive) and diastolic (congestive) heart failure: Secondary | ICD-10-CM

## 2020-03-31 NOTE — Progress Notes (Signed)
Remote ICD transmission.   

## 2020-03-31 NOTE — Telephone Encounter (Signed)
Fax from [facility] by [employer], prescription assistance program RF request for Entresto 24-26 mg 1 BID Please print and sign, return to fax back to [phone]

## 2020-04-01 ENCOUNTER — Telehealth: Payer: Self-pay

## 2020-04-01 MED ORDER — ENTRESTO 24-26 MG PO TABS: 1.0000 | ORAL_TABLET | Freq: Two times a day (BID) | ORAL | 3 refills | Status: DC

## 2020-04-01 NOTE — Telephone Encounter (Signed)
Entresto faxed to [facility] mail order pharmacy today.

## 2020-04-02 NOTE — Telephone Encounter (Signed)
Printed Rx faxed to [facility] by [employer]

## 2020-04-10 ENCOUNTER — Telehealth: Payer: Self-pay | Admitting: Family Medicine

## 2020-04-10 DIAGNOSIS — M1712 Unilateral primary osteoarthritis, left knee: Secondary | ICD-10-CM | POA: Diagnosis not present

## 2020-04-10 DIAGNOSIS — L309 Dermatitis, unspecified: Secondary | ICD-10-CM

## 2020-04-10 NOTE — Telephone Encounter (Signed)
REFERRAL REQUEST Telephone Note  Have you been seen at our office for this problem? Yes (Advise that they may need an appointment with their PCP before a referral can be done)  Reason for Referral:  Referral discussed with patient:  Best contact number of patient for referral team:    Has patient been seen by a specialist for this issue before: Yes Patient provider preference for referral: He would like to be seen at Dr. Payton Emerald Office in [place] Patient location preference for referral:  [facility]   Patient notified that referrals can take up to a week or longer to process. If they haven't heard anything within a week they should call back and speak with the referral department.

## 2020-04-10 NOTE — Telephone Encounter (Signed)
Placed a order for patient's for nonhealing rash to dermatology

## 2020-04-10 NOTE — Telephone Encounter (Signed)
Pt thinks he has a fungus, he states that Dr. Warrick Parisian has seen him for this told him to put cream on it and he has been doing for two weeks but it has not improved

## 2020-04-10 NOTE — Telephone Encounter (Signed)
I need a reason why the patient wants to be seen by dermatology, please let me know, cannot place a referral without a reason.

## 2020-04-10 NOTE — Telephone Encounter (Signed)
Pt aware of provider feedback and voiced understanding. 

## 2020-04-10 NOTE — Progress Notes (Signed)
No ICM remote transmission received for 04/07/2020 and next ICM transmission scheduled for 05/05/2020.

## 2020-04-16 DIAGNOSIS — L570 Actinic keratosis: Secondary | ICD-10-CM | POA: Diagnosis not present

## 2020-04-16 DIAGNOSIS — L0100 Impetigo, unspecified: Secondary | ICD-10-CM | POA: Diagnosis not present

## 2020-04-16 DIAGNOSIS — L304 Erythema intertrigo: Secondary | ICD-10-CM | POA: Diagnosis not present

## 2020-05-11 DIAGNOSIS — L309 Dermatitis, unspecified: Secondary | ICD-10-CM | POA: Diagnosis not present

## 2020-05-11 DIAGNOSIS — L304 Erythema intertrigo: Secondary | ICD-10-CM | POA: Diagnosis not present

## 2020-05-15 NOTE — Progress Notes (Signed)
No ICM remote transmission received for 05/05/2020 and next ICM transmission scheduled for 06/22/2020.   

## 2020-06-10 ENCOUNTER — Telehealth: Payer: Self-pay | Admitting: Internal Medicine

## 2020-06-10 ENCOUNTER — Ambulatory Visit (INDEPENDENT_AMBULATORY_CARE_PROVIDER_SITE_OTHER): Payer: Medicare PPO

## 2020-06-10 DIAGNOSIS — Z95810 Presence of automatic (implantable) cardiac defibrillator: Secondary | ICD-10-CM

## 2020-06-10 DIAGNOSIS — I5022 Chronic systolic (congestive) heart failure: Secondary | ICD-10-CM | POA: Diagnosis not present

## 2020-06-10 NOTE — Telephone Encounter (Signed)
  1. Has your device fired? no  2. Is you device beeping? no  3. Are you experiencing draining or swelling at device site? No   4. Are you calling to see if we received your device transmission? Yes patient drove to city to send reports and wants to confirm transmission   5. Have you passed out?  No     Please route to [place]

## 2020-06-10 NOTE — Progress Notes (Signed)
EPIC Encounter for ICM Monitoring  Patient Name: [place] Werner is a 65 y.o. male Date: 06/10/2020 Primary Care Physican: Dettinger, Fransisca Kaufmann, MD Primary [facility] Electrophysiologist:Allred 2/9/2022Weight305lbs  Spoke with patient and reports feeling well at this time.  Denies fluid symptoms.    CorVue thoracic impedance normal fluid levels but was suggesting possible fluid accumulation from 05/24/2020 -06/01/2020.  Prescribed: Spironolactone 25 mg take 1 tablet daily  Labs: 11/05/2019 Creatinine 0.93, BUN 24, Potassium 4.2, Sodium 143, GFR 85->90 Care Everywhere 10/29/2019 Creatinine 0.89, BUN 15, Potassium 4.6, Sodium 141, GFR 89->90 Care Everywhere 10/28/2019 Creatinine 0.88, BUN 13, Potassium 4.7, Sodium 142, GFR 90->90 Care Everywhere 07/26/2019 Creatinine 0.93, BUN 14, Potassium 5.2, Sodium 142, GFR 85-99 01/25/2019 Creatinine0.96, BUN19, Potassium4.6, Sodium142, [id number] 10/13/2018 Creatinine0.75, BUN17, Potassium4.3, [id number], [id number] A complete set of results can be found in Results Review.  Recommendations:Recommendation to limit salt intake to 2000 mg daily and fluid intake to 64 oz daily.  Encouraged to call if experiencing any fluid symptoms.   Follow-up plan: ICM clinic phone appointment on3/14/2022. 91 day device clinic remote transmission2/23/2022.    EP/Cardiology Office Visits: Advised to call office to schedule appointments with cardiologist and Dr Rayann Heman.    Copy of ICM check sent to Dr. Rayann Heman.   3 month ICM trend: 06/08/2020.    1 Year ICM trend:       Rosalene Billings, RN 06/10/2020 1:42 PM

## 2020-06-10 NOTE — Telephone Encounter (Signed)
Spoke with patient.. See ICM note 

## 2020-07-14 ENCOUNTER — Ambulatory Visit (INDEPENDENT_AMBULATORY_CARE_PROVIDER_SITE_OTHER): Payer: Medicare PPO

## 2020-07-14 DIAGNOSIS — Z95810 Presence of automatic (implantable) cardiac defibrillator: Secondary | ICD-10-CM

## 2020-07-14 DIAGNOSIS — I5022 Chronic systolic (congestive) heart failure: Secondary | ICD-10-CM

## 2020-07-17 NOTE — Progress Notes (Signed)
EPIC Encounter for ICM Monitoring  Patient Name: Russell Werner is a 65 y.o. male Date: 07/17/2020 Primary Care Physican: Dettinger, Fransisca Kaufmann, MD Primary Cardiologist:Allred Electrophysiologist:Allred 3/18/2022Weight310lbs  Spoke with patient and reports he's had some shortness of breathe in the last week.  Pt reports he cooks at home and is very strict on limiting salt and fluid intake.     CorVue thoracic impedance suggesting possible fluid accumulation starting 07/05/2020 and starting to trend back toward baseline 07/16/2020.  Prescribed: Spironolactone 25 mg take 1 tablet daily  Labs: 01/27/2020 Creatinine 0.75, BUN 12, Potassium 4.6, Sodium 143, GFR 96-111 11/05/2019 Creatinine0.93, BUN24, Potassium4.2, [id number], GFR85->90 Care Everywhere 10/29/2019 Creatinine0.89, BUN15, Potassium4.6, Sodium141, GFR89->90 Care Everywhere 10/28/2019 Creatinine0.88, BUN13, Potassium4.7, Sodium142, GFR90->90 Care Everywhere 07/26/2019 Creatinine 0.93, BUN 14, Potassium 5.2, Sodium 142, GFR 85-99 01/25/2019 Creatinine0.96, BUN19, Potassium4.6, Sodium142, [id number] 10/13/2018 Creatinine0.75, BUN17, Potassium4.3, [id number], [id number] A complete set of results can be found in Results Review.  Recommendations:Advised to restrict salt intake to 2000 mg, avoid restaurant foods, and limit fluid intake to 64 oz daily  Follow-up plan: ICM clinic phone appointment on 07/27/2020 to recheck fluid levels. 91 day device clinic remote transmission5/25/2022.    EP/Cardiology Office Visits:10/09/2020 with Dr Rayann Heman.    Copy of ICM check sent to Dr.Allred.   3 month ICM trend: 07/17/2020.    1 Year ICM trend:       Rosalene Billings, RN 07/17/2020 1:31 PM

## 2020-07-31 ENCOUNTER — Encounter: Payer: Medicare HMO | Admitting: Internal Medicine

## 2020-07-31 NOTE — Progress Notes (Signed)
No ICM remote transmission received for 07/27/2020 and next ICM transmission scheduled for 08/17/2020.

## 2020-08-17 ENCOUNTER — Other Ambulatory Visit: Payer: Self-pay | Admitting: Family Medicine

## 2020-08-28 NOTE — Progress Notes (Signed)
No ICM remote transmission received for 08/17/2020 and next ICM transmission scheduled for 10/05/2020.   

## 2020-09-24 ENCOUNTER — Other Ambulatory Visit: Payer: Self-pay

## 2020-09-24 ENCOUNTER — Encounter: Payer: Self-pay | Admitting: Family Medicine

## 2020-09-24 ENCOUNTER — Ambulatory Visit (INDEPENDENT_AMBULATORY_CARE_PROVIDER_SITE_OTHER): Payer: Medicare PPO | Admitting: Family Medicine

## 2020-09-24 VITALS — BP 112/66 | HR 65 | Temp 98.0°F | Resp 20 | Ht 70.0 in | Wt 319.0 lb

## 2020-09-24 DIAGNOSIS — E782 Mixed hyperlipidemia: Secondary | ICD-10-CM

## 2020-09-24 DIAGNOSIS — G4733 Obstructive sleep apnea (adult) (pediatric): Secondary | ICD-10-CM

## 2020-09-24 DIAGNOSIS — I10 Essential (primary) hypertension: Secondary | ICD-10-CM

## 2020-09-24 DIAGNOSIS — B3749 Other urogenital candidiasis: Secondary | ICD-10-CM

## 2020-09-24 DIAGNOSIS — I5042 Chronic combined systolic (congestive) and diastolic (congestive) heart failure: Secondary | ICD-10-CM | POA: Diagnosis not present

## 2020-09-24 LAB — CBC WITH DIFFERENTIAL/PLATELET
Basophils Absolute: 0.1 10*3/uL (ref 0.0–0.2)
Basos: 1 %
EOS (ABSOLUTE): 0.2 10*3/uL (ref 0.0–0.4)
Eos: 2 %
Hematocrit: 43.6 % (ref 37.5–51.0)
Hemoglobin: 14.1 g/dL (ref 13.0–17.7)
Immature Grans (Abs): 0 10*3/uL (ref 0.0–0.1)
Immature Granulocytes: 0 %
Lymphocytes Absolute: 2 10*3/uL (ref 0.7–3.1)
Lymphs: 25 %
MCH: 29.4 pg (ref 26.6–33.0)
MCHC: 32.3 g/dL (ref 31.5–35.7)
MCV: 91 fL (ref 79–97)
Monocytes Absolute: 0.5 10*3/uL (ref 0.1–0.9)
Monocytes: 6 %
Neutrophils Absolute: 5.4 10*3/uL (ref 1.4–7.0)
Neutrophils: 66 %
Platelets: 208 10*3/uL (ref 150–450)
RBC: 4.79 x10E6/uL (ref 4.14–5.80)
RDW: 12.7 % (ref 11.6–15.4)
WBC: 8.2 10*3/uL (ref 3.4–10.8)

## 2020-09-24 LAB — CMP14+EGFR
ALT: 15 IU/L (ref 0–44)
AST: 10 IU/L (ref 0–40)
Albumin/Globulin Ratio: 1.7 (ref 1.2–2.2)
Albumin: 4.2 g/dL (ref 3.8–4.8)
Alkaline Phosphatase: 71 IU/L (ref 44–121)
BUN/Creatinine Ratio: 16 (ref 10–24)
BUN: 14 mg/dL (ref 8–27)
Bilirubin Total: 0.5 mg/dL (ref 0.0–1.2)
CO2: 27 mmol/L (ref 20–29)
Calcium: 8.9 mg/dL (ref 8.6–10.2)
Chloride: 103 mmol/L (ref 96–106)
Creatinine, Ser: 0.88 mg/dL (ref 0.76–1.27)
Globulin, Total: 2.5 g/dL (ref 1.5–4.5)
Glucose: 136 mg/dL — ABNORMAL HIGH (ref 65–99)
Potassium: 4.4 mmol/L (ref 3.5–5.2)
Sodium: 141 mmol/L (ref 134–144)
Total Protein: 6.7 g/dL (ref 6.0–8.5)
eGFR: 94 mL/min/{1.73_m2} (ref >59)

## 2020-09-24 LAB — LIPID PANEL
Chol/HDL Ratio: 3.6 ratio (ref 0.0–5.0)
Cholesterol, Total: 158 mg/dL (ref 100–199)
HDL: 44 mg/dL (ref >39)
LDL Chol Calc (NIH): 85 mg/dL (ref 0–99)
Triglycerides: 166 mg/dL — ABNORMAL HIGH (ref 0–149)
VLDL Cholesterol Cal: 29 mg/dL (ref 5–40)

## 2020-09-24 MED ORDER — SPIRONOLACTONE 25 MG PO TABS: ORAL_TABLET | ORAL | 3 refills | Status: DC

## 2020-09-24 MED ORDER — ROSUVASTATIN CALCIUM 20 MG PO TABS: 20.0000 mg | ORAL_TABLET | Freq: Every day | ORAL | 3 refills | Status: DC

## 2020-09-24 MED ORDER — FINASTERIDE 5 MG PO TABS: 5.0000 mg | ORAL_TABLET | Freq: Every day | ORAL | 3 refills | Status: DC

## 2020-09-24 MED ORDER — ENTRESTO 24-26 MG PO TABS: 1.0000 | ORAL_TABLET | Freq: Two times a day (BID) | ORAL | 3 refills | Status: AC

## 2020-09-24 MED ORDER — TRAZODONE HCL 50 MG PO TABS: 50.0000 mg | ORAL_TABLET | Freq: Every day | ORAL | 3 refills | Status: DC

## 2020-09-24 MED ORDER — NYSTATIN 100000 UNIT/GM EX OINT: 1.0000 "application " | TOPICAL_OINTMENT | Freq: Two times a day (BID) | CUTANEOUS | 0 refills | Status: DC

## 2020-09-24 NOTE — Progress Notes (Signed)
BP 112/66   Pulse 65   Temp 98 F (36.7 C) (Temporal)   Resp 20   Ht 5\' 10"  (1.778 m)   Wt (!) 319 lb (144.7 kg)   SpO2 93%   BMI 45.77 kg/m    Subjective:   Patient ID: Russell Werner, male    DOB: [birth year], 65 y.o.   MRN: [id number]  HPI: TARICK PARENTEAU is a 65 y.o. male presenting on 09/24/2020 for Medical Management of Chronic Issues   HPI Hypertension and CHF recheck Patient is currently on Entresto and spironolactone and bisoprolol, and their blood pressure today is 112/66. Patient denies any lightheadedness or dizziness. Patient denies headaches, blurred vision, chest pains, shortness of breath, or weakness. Denies any side effects from medication and is content with current medication.   Hyperlipidemia Patient is coming in for recheck of his hyperlipidemia. The patient is currently taking Crestor. They deny any issues with myalgias or history of liver damage from it. They deny any focal numbness or weakness or chest pain.   Sleep apnea Patient uses his sleep apnea machine 50-60 percent of the night Patient uses a sleep apnea machine 26 days of the month Patient feels like his CPAP makes a significant difference in his sleep and energy. Patient is on CPAP settings AutoPap he thinks, 10-15.  Patient is still fighting issues with yeast on the head of his penis although improved is not gone and he would like to try something to help with the more.  He says it is burning and get very irritated.  Relevant past medical, surgical, family and social history reviewed and updated as indicated. Interim medical history since our last visit reviewed. Allergies and medications reviewed and updated.  Review of Systems  Constitutional: Negative for chills and fever.  Respiratory: Negative for shortness of breath and wheezing.   Cardiovascular: Negative for chest pain and leg swelling.  Musculoskeletal: Negative for back pain and gait problem.  Skin: Positive for color change and  rash.  Psychiatric/Behavioral: Positive for sleep disturbance.  All other systems reviewed and are negative.   Per HPI unless specifically indicated above   Allergies as of 09/24/2020   No Known Allergies     Medication List       Accurate as of Sep 24, 2020  8:43 AM. If you have any questions, ask your nurse or doctor.        STOP taking these medications   aspirin EC 81 MG tablet Stopped by: Fransisca Kaufmann Franki Alcaide, MD   naproxen 500 MG tablet Commonly known as: Naprosyn Stopped by: Worthy Rancher, MD   vitamin B-12 500 MCG tablet Commonly known as: CYANOCOBALAMIN Stopped by: Fransisca Kaufmann Raivyn Kabler, MD     TAKE these medications   bisoprolol 5 MG tablet Commonly known as: ZEBETA Take 1 tablet (5 mg total) by mouth daily.   Entresto 24-26 MG Generic drug: sacubitril-valsartan Take 1 tablet by mouth 2 (two) times daily.   finasteride 5 MG tablet Commonly known as: PROSCAR TAKE 1 TABLET EVERY DAY   multivitamin capsule Take 1 capsule by mouth 2 (two) times daily.   rosuvastatin 20 MG tablet Commonly known as: CRESTOR TAKE 1 TABLET EVERY DAY   spironolactone 25 MG tablet Commonly known as: ALDACTONE TAKE 1 TABLET EVERY DAY. NEED TO BE SEEN BEFORE NEXT REFILL.   traZODone 50 MG tablet Commonly known as: DESYREL   vitamin C 1000 MG tablet Take 2,000 mg by mouth daily.   VITAMIN  D PO Take 1 tablet by mouth daily.        Objective:   Pulse 65   Temp 98 F (36.7 C) (Temporal)   Resp 20   Ht 5\' 10"  (1.778 m)   Wt (!) 319 lb (144.7 kg)   SpO2 93%   BMI 45.77 kg/m   Wt Readings from Last 3 Encounters:  09/24/20 (!) 319 lb (144.7 kg)  02/27/20 (!) 326 lb (147.9 kg)  02/26/20 (!) 326 lb (147.9 kg)    Physical Exam Vitals and nursing note reviewed.  Constitutional:      General: He is not in acute distress.    Appearance: He is well-developed. He is not diaphoretic.  Eyes:     General: No scleral icterus.    Conjunctiva/sclera: Conjunctivae  normal.  Neck:     Thyroid: No thyromegaly.  Cardiovascular:     Rate and Rhythm: Normal rate and regular rhythm.     Heart sounds: Normal heart sounds. No murmur heard.   Pulmonary:     Effort: Pulmonary effort is normal. No respiratory distress.     Breath sounds: Normal breath sounds. No wheezing.  Musculoskeletal:        General: Normal range of motion.     Cervical back: Neck supple.  Lymphadenopathy:     Cervical: No cervical adenopathy.  Skin:    General: Skin is warm and dry.     Findings: Rash (Small papular rash on the head of his penis, few spots, irritated and pink but no erythema or warmth or drainage) present.  Neurological:     Mental Status: He is alert and oriented to person, place, and time.     Coordination: Coordination normal.  Psychiatric:        Behavior: Behavior normal.       Assessment & Plan:   Problem List Items Addressed This Visit      Cardiovascular and Mediastinum   CHF (congestive heart failure) (HCC)   Relevant Medications   spironolactone (ALDACTONE) 25 MG tablet   rosuvastatin (CRESTOR) 20 MG tablet   sacubitril-valsartan (ENTRESTO) 24-26 MG   Hypertension   Relevant Medications   spironolactone (ALDACTONE) 25 MG tablet   rosuvastatin (CRESTOR) 20 MG tablet   sacubitril-valsartan (ENTRESTO) 24-26 MG     Respiratory   Sleep apnea, obstructive   Relevant Orders   Ambulatory referral to Sleep Studies     Other   Hyperlipidemia - Primary   Relevant Medications   spironolactone (ALDACTONE) 25 MG tablet   rosuvastatin (CRESTOR) 20 MG tablet   sacubitril-valsartan (ENTRESTO) 24-26 MG    Other Visit Diagnoses    Yeast dermatitis of penis       Relevant Medications   nystatin ointment (MYCOSTATIN)      Continue current medication, will check blood work, did referral to sleep specialist to reevaluate for his sleep machine, sent nystatin for him Follow up plan: Return in about 6 months (around 03/27/2021), or if symptoms worsen  or fail to improve, for CHF and hypertension and sleep apnea..  Counseling provided for all of the vaccine components No orders of the defined types were placed in this encounter.   Caryl Pina, MD [facility] 09/24/2020, 8:43 AM

## 2020-09-24 NOTE — Addendum Note (Signed)
Addended by: Caryl Pina on: 09/24/2020 09:15 AM   Modules accepted: Orders

## 2020-10-02 ENCOUNTER — Encounter: Payer: Medicare HMO | Admitting: Internal Medicine

## 2020-10-05 ENCOUNTER — Ambulatory Visit (INDEPENDENT_AMBULATORY_CARE_PROVIDER_SITE_OTHER): Payer: Medicare PPO

## 2020-10-05 DIAGNOSIS — I5022 Chronic systolic (congestive) heart failure: Secondary | ICD-10-CM | POA: Diagnosis not present

## 2020-10-05 DIAGNOSIS — Z95810 Presence of automatic (implantable) cardiac defibrillator: Secondary | ICD-10-CM

## 2020-10-08 ENCOUNTER — Telehealth: Payer: Self-pay

## 2020-10-08 NOTE — Telephone Encounter (Signed)
LMOVM for patient to send missed ICM transmission. 

## 2020-10-09 ENCOUNTER — Other Ambulatory Visit: Payer: Self-pay

## 2020-10-09 ENCOUNTER — Ambulatory Visit (INDEPENDENT_AMBULATORY_CARE_PROVIDER_SITE_OTHER): Payer: Medicare PPO

## 2020-10-09 ENCOUNTER — Encounter: Payer: Self-pay | Admitting: Internal Medicine

## 2020-10-09 ENCOUNTER — Ambulatory Visit (INDEPENDENT_AMBULATORY_CARE_PROVIDER_SITE_OTHER): Payer: Medicare PPO | Admitting: Internal Medicine

## 2020-10-09 VITALS — BP 110/60 | HR 61 | Ht 70.0 in | Wt 320.2 lb

## 2020-10-09 DIAGNOSIS — I5022 Chronic systolic (congestive) heart failure: Secondary | ICD-10-CM | POA: Diagnosis not present

## 2020-10-09 DIAGNOSIS — I10 Essential (primary) hypertension: Secondary | ICD-10-CM | POA: Diagnosis not present

## 2020-10-09 DIAGNOSIS — I428 Other cardiomyopathies: Secondary | ICD-10-CM

## 2020-10-09 MED ORDER — BISOPROLOL FUMARATE 5 MG PO TABS: 5.0000 mg | ORAL_TABLET | Freq: Every day | ORAL | 3 refills | Status: DC

## 2020-10-09 NOTE — Patient Instructions (Signed)
Medication Instructions:  Continue all current medications.  Labwork: none  Testing/Procedures: none  Follow-Up: 1 year   Any Other Special Instructions Will Be Listed Below (If Applicable).  If you need a refill on your cardiac medications before your next appointment, please call your pharmacy.  

## 2020-10-09 NOTE — Progress Notes (Signed)
PCP: Dettinger, Fransisca Kaufmann, MD Primary Cardiologist: previously Dr Bronson Ing Primary EP: Dr Rayann Heman  Russell Werner is a 65 y.o. male who presents today for routine electrophysiology followup.  Since last being seen in our clinic, the patient reports doing very well.  Today, he denies symptoms of palpitations, chest pain, shortness of breath,  lower extremity edema, dizziness, presyncope, syncope, or ICD shocks.  The patient is otherwise without complaint today.   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   Arthritis    "fingers" (05/03/2017)   CHF (congestive heart failure) (HCC)    COPD (chronic obstructive pulmonary disease) ([place])    Coronary artery disease    nonobstructive mid RCA by 2009 cath at [facility] in [place]   Enlarged prostate    High cholesterol    Hypertension    Melanoma of back ([place]) 2013   Meniscal injury    Myocardial infarction ([place]) 1998   mild   OSA on CPAP    "severe" (05/03/2017)   Right knee meniscal tear    Past Surgical History:  Procedure Laterality Date   CARDIAC CATHETERIZATION  X 2   CARDIAC DEFIBRILLATOR PLACEMENT  05/03/2017   CYST REMOVAL LEG Bilateral 2013   HERNIA REPAIR     ICD IMPLANT N/A 05/03/2017   Procedure: ICD IMPLANT;  Surgeon: Thompson Grayer, MD;  Location: [facility];  Service: Cardiovascular;  Laterality: N/A;   KNEE ARTHROSCOPY WITH MEDIAL MENISECTOMY Right 10/18/2017   Procedure: RIGHT KNEE ARTHROSCOPY WITH PARTIAL MEDIAL MENISCECTOMY;  Surgeon: Leandrew Koyanagi, MD;  Location: [facility];  Service: Orthopedics;  Laterality: Right;   KNEE CARTILAGE SURGERY Left 1972   LAPAROSCOPIC CHOLECYSTECTOMY  2015   LAPAROSCOPIC GASTRIC BAND REMOVAL WITH LAPAROSCOPIC GASTRIC SLEEVE RESECTION  11/05/2014   MELANOMA EXCISION  8527   "back"   UMBILICAL HERNIA REPAIR  2014    ROS- all systems are reviewed and negative except as per HPI above  Current Outpatient  Medications  Medication Sig Dispense Refill   Ascorbic Acid (VITAMIN C) 1000 MG tablet Take 2,000 mg by mouth daily.     bisoprolol (ZEBETA) 5 MG tablet Take 1 tablet (5 mg total) by mouth daily. 90 tablet 2   finasteride (PROSCAR) 5 MG tablet Take 1 tablet (5 mg total) by mouth daily. 90 tablet 3   Multiple Vitamin (MULTIVITAMIN) capsule Take 1 capsule by mouth 2 (two) times daily.      nystatin ointment (MYCOSTATIN) Apply 1 application topically 2 (two) times daily. 60 g 0   rosuvastatin (CRESTOR) 20 MG tablet Take 1 tablet (20 mg total) by mouth daily. 90 tablet 3   sacubitril-valsartan (ENTRESTO) 24-26 MG Take 1 tablet by mouth 2 (two) times daily. 180 tablet 3   spironolactone (ALDACTONE) 25 MG tablet TAKE 1 TABLET EVERY DAY 90 tablet 3   traZODone (DESYREL) 50 MG tablet Take 1 tablet (50 mg total) by mouth at bedtime. 90 tablet 3   VITAMIN D PO Take 1 tablet by mouth daily.     No current facility-administered medications for this visit.    Physical Exam: Vitals:   10/09/20 0933  BP: 110/60  Pulse: 61  SpO2: 93%  Weight: (!) 320 lb 3.2 oz (145.2 kg)  Height: 5\' 10"  (1.778 m)    GEN- The patient is overweight appearing, alert and oriented x 3 today.   Head- normocephalic, atraumatic Eyes-  Sclera clear, conjunctiva pink Ears- hearing intact Oropharynx-  clear Lungs- Clear to ausculation bilaterally, normal work of breathing Chest- ICD pocket is well healed Heart- Regular rate and rhythm, no murmurs, rubs or gallops, PMI not laterally displaced GI- soft, NT, ND, + BS Extremities- no clubbing, cyanosis, or edema  ICD interrogation- reviewed in detail today,  See PACEART report  ekg tracing ordered today is personally reviewed and shows sinus with PACs, IVCD (QRS 130 msec)  Wt Readings from Last 3 Encounters:  10/09/20 (!) 320 lb 3.2 oz (145.2 kg)  09/24/20 (!) 319 lb (144.7 kg)  02/27/20 (!) 326 lb (147.9 kg)    Assessment and Plan:  1.  Chronic systolic  dysfunction euvolemic today Stable on an appropriate medical regimen Normal ICD function See [facility] report No changes today he is not device dependant today followed in ICM device clinic  2. Obesity Body mass index is 45.94 kg/m. Lifestyle modification is advised  3. Severe OSA Uses CPAP  4. HTN Stable No change required today  Return in a year Overdue to see general cardiology  Thompson Grayer MD, [facility] 10/09/2020 9:46 AM

## 2020-10-09 NOTE — Progress Notes (Signed)
EPIC Encounter for ICM Monitoring  Patient Name: Russell Werner is a 65 y.o. male Date: 10/09/2020 Primary Care Physican: Dettinger, Fransisca Kaufmann, MD Primary Cardiologist: Allred Electrophysiologist: Allred 10/09/2020 Office Weight 320 lbs                                                          Transmission reviewed.  Patient had defib check in office 10/09/2020.   CorVue thoracic impedance suggesting normal fluid levels.   Prescribed: Spironolactone 25 mg take 1 tablet daily     Labs: 09/24/2020 Creatinine 0.88, BUN 14, Potassium 4.4, Sodium 141, GFR 94 A complete set of results can be found in Results Review.   Recommendations:  No changes.   Follow-up plan: ICM clinic phone appointment on 11/16/2020.   91 day device clinic remote transmission 12/23/2020.     EP/Cardiology Office Visits: 04/12/2021 with Dr Harl Bowie (establish care).  10/01/2021 with Dr Rayann Heman.     Copy of ICM check sent to Dr. Rayann Heman.   3 month ICM trend: 10/09/2020.    1 Year ICM trend:       Rosalene Billings, RN 10/09/2020 1:48 PM

## 2020-10-12 LAB — CUP PACEART REMOTE DEVICE CHECK
Battery Remaining Longevity: 77 mo
Battery Remaining Percentage: 69 %
Battery Voltage: 2.99 V
Brady Statistic RV Percent Paced: 0 %
Date Time Interrogation Session: 20220610130038
HighPow Impedance: 70 Ohm
HighPow Impedance: 73 Ohm
Implantable Lead Implant Date: 20190102
Implantable Lead Location: 753860
Implantable Pulse Generator Implant Date: 20190102
Lead Channel Impedance Value: 610 Ohm
Lead Channel Pacing Threshold Amplitude: 0.5 V
Lead Channel Pacing Threshold Pulse Width: 0.5 ms
Lead Channel Sensing Intrinsic Amplitude: 11.5 mV
Lead Channel Setting Pacing Amplitude: 2.5 V
Lead Channel Setting Pacing Pulse Width: 0.5 ms
Lead Channel Setting Sensing Sensitivity: 0.5 mV
Pulse Gen Serial Number: 9786935

## 2020-10-14 ENCOUNTER — Telehealth: Payer: Self-pay

## 2020-10-14 DIAGNOSIS — G4733 Obstructive sleep apnea (adult) (pediatric): Secondary | ICD-10-CM | POA: Diagnosis not present

## 2020-10-14 NOTE — Telephone Encounter (Signed)
LMOVM to let the patient know we did receive his transmission.

## 2020-10-15 DIAGNOSIS — G4733 Obstructive sleep apnea (adult) (pediatric): Secondary | ICD-10-CM | POA: Diagnosis not present

## 2020-10-26 ENCOUNTER — Other Ambulatory Visit: Payer: Self-pay

## 2020-10-27 NOTE — Progress Notes (Signed)
Remote ICD transmission.   

## 2020-11-16 ENCOUNTER — Ambulatory Visit (INDEPENDENT_AMBULATORY_CARE_PROVIDER_SITE_OTHER): Payer: Medicare PPO

## 2020-11-16 DIAGNOSIS — Z95810 Presence of automatic (implantable) cardiac defibrillator: Secondary | ICD-10-CM | POA: Diagnosis not present

## 2020-11-16 DIAGNOSIS — I5022 Chronic systolic (congestive) heart failure: Secondary | ICD-10-CM

## 2020-11-18 NOTE — Progress Notes (Signed)
EPIC Encounter for ICM Monitoring  Patient Name: Russell Werner is a 65 y.o. male Date: 11/18/2020 Primary Care Physican: Dettinger, Fransisca Kaufmann, MD Primary Cardiologist: Allred Electrophysiologist: Allred 11/18/2020 Office Weight 320 lbs                                                          Spoke with patient and heart failure questions reviewed.  Pt asymptomatic for fluid accumulation and feeing well.   CorVue thoracic impedance suggesting normal fluid levels.   Prescribed: Spironolactone 25 mg take 1 tablet daily     Labs: 09/24/2020 Creatinine 0.88, BUN 14, Potassium 4.4, Sodium 141, GFR 94 A complete set of results can be found in Results Review.   Recommendations:  No changes and encouraged to call if experiencing any fluid symptoms.    Follow-up plan: ICM clinic phone appointment on 12/21/2020.   91 day device clinic remote transmission 12/23/2020.     EP/Cardiology Office Visits: 04/12/2021 with Dr Harl Bowie (establish care).  10/01/2021 with Dr Rayann Heman.     Copy of ICM check sent to Dr. Rayann Heman.    3 month ICM trend: 11/16/2020.    1 Year ICM trend:       Rosalene Billings, RN 11/18/2020 1:48 PM

## 2020-12-21 ENCOUNTER — Ambulatory Visit (INDEPENDENT_AMBULATORY_CARE_PROVIDER_SITE_OTHER): Payer: Medicare PPO

## 2020-12-21 DIAGNOSIS — Z95810 Presence of automatic (implantable) cardiac defibrillator: Secondary | ICD-10-CM

## 2020-12-21 DIAGNOSIS — I5022 Chronic systolic (congestive) heart failure: Secondary | ICD-10-CM

## 2020-12-21 NOTE — Progress Notes (Signed)
EPIC Encounter for ICM Monitoring  Patient Name: NIKIL MAYON is a 65 y.o. male Date: 12/21/2020 Primary Care Physican: Dettinger, Fransisca Kaufmann, MD Primary Cardiologist: Allred Electrophysiologist: Allred 11/18/2020 Office Weight 320 lbs                                                          Attempted call to patient and unable to reach.  Left detailed message per DPR regarding transmission. Transmission reviewed.    CorVue thoracic impedance suggesting possible fluid accumulation since 8/13 and returned to baseline on transmission date 8/22.   Prescribed: Spironolactone 25 mg take 1 tablet daily     Labs: 09/24/2020 Creatinine 0.88, BUN 14, Potassium 4.4, Sodium 141, GFR 94 A complete set of results can be found in Results Review.   Recommendations:  Left voice mail with ICM number and encouraged to call if experiencing any fluid symptoms.   Follow-up plan: ICM clinic phone appointment on 01/25/2021.   91 day device clinic remote transmission 01/11/2021.     EP/Cardiology Office Visits: 04/12/2021 with Dr Harl Bowie (establish care).  10/01/2021 with Dr Rayann Heman.     Copy of ICM check sent to Dr. Rayann Heman.    3 month ICM trend: 12/21/2020.    1 Year ICM trend:       Rosalene Billings, RN 12/21/2020 4:37 PM

## 2021-01-11 ENCOUNTER — Ambulatory Visit (INDEPENDENT_AMBULATORY_CARE_PROVIDER_SITE_OTHER): Payer: Medicare PPO

## 2021-01-11 DIAGNOSIS — I428 Other cardiomyopathies: Secondary | ICD-10-CM

## 2021-01-12 ENCOUNTER — Telehealth: Payer: Self-pay

## 2021-01-12 LAB — CUP PACEART REMOTE DEVICE CHECK
Battery Remaining Longevity: 73 mo
Battery Remaining Percentage: 67 %
Battery Voltage: 2.99 V
Brady Statistic RV Percent Paced: 1 %
Date Time Interrogation Session: 20220912020016
HighPow Impedance: 68 Ohm
HighPow Impedance: 68 Ohm
Implantable Lead Implant Date: 20190102
Implantable Lead Location: 753860
Implantable Pulse Generator Implant Date: 20190102
Lead Channel Impedance Value: 590 Ohm
Lead Channel Pacing Threshold Amplitude: 0.5 V
Lead Channel Pacing Threshold Pulse Width: 0.5 ms
Lead Channel Sensing Intrinsic Amplitude: 11.5 mV
Lead Channel Setting Pacing Amplitude: 2.5 V
Lead Channel Setting Pacing Pulse Width: 0.5 ms
Lead Channel Setting Sensing Sensitivity: 0.5 mV
Pulse Gen Serial Number: 9786935

## 2021-01-12 NOTE — Telephone Encounter (Signed)
"  Scheduled remote reviewed. Normal device function.   4 NS episodes '@180'$  bpm, longest 6 sec. R-R wide and irregular. Morphology discriminator toggles.  Routing to triage to assess"  Successful telephone encounter to patient to assess for triggers or s/s of tachycardia during event. Per patient he is unaware of this event and cannot recollect what he may have been doing during time of event. He continues to take his medications as prescribed including bisoprolol, entresto, and aldactone. States he has been feeling very well. Scheduled remote exported to Dr. Rayann Heman for review.

## 2021-01-14 DIAGNOSIS — Z6841 Body Mass Index (BMI) 40.0 and over, adult: Secondary | ICD-10-CM | POA: Diagnosis not present

## 2021-01-14 DIAGNOSIS — S46011A Strain of muscle(s) and tendon(s) of the rotator cuff of right shoulder, initial encounter: Secondary | ICD-10-CM | POA: Diagnosis not present

## 2021-01-18 NOTE — Progress Notes (Addendum)
Remote ICD transmission.   

## 2021-01-25 ENCOUNTER — Ambulatory Visit (INDEPENDENT_AMBULATORY_CARE_PROVIDER_SITE_OTHER): Payer: Medicare PPO

## 2021-01-25 DIAGNOSIS — Z95810 Presence of automatic (implantable) cardiac defibrillator: Secondary | ICD-10-CM

## 2021-01-25 DIAGNOSIS — I5022 Chronic systolic (congestive) heart failure: Secondary | ICD-10-CM

## 2021-01-25 NOTE — Progress Notes (Signed)
EPIC Encounter for ICM Monitoring  Patient Name: Russell Werner is a 65 y.o. male Date: 01/25/2021 Primary Care Physican: Dettinger, Fransisca Kaufmann, MD Primary Cardiologist: Allred Electrophysiologist: Allred 01/25/2021 Weight 310 lbs                                                          Spoke with patient and heart failure questions reviewed.  Pt asymptomatic for fluid accumulation and feeling well.  Discussed diet and fluid intake.   Pt reports eating healthy and difficult to correlate causes of decreased impedance.   CorVue thoracic impedance normal but was suggesting possible fluid accumulation since 9/4-9/13.   Prescribed: Spironolactone 25 mg take 1 tablet daily     Labs: 09/24/2020 Creatinine 0.88, BUN 14, Potassium 4.4, Sodium 141, GFR 94 A complete set of results can be found in Results Review.   Recommendations:  Recommendation to limit salt intake to 2000 mg daily and fluid intake to 64 oz daily.  Encouraged to call if experiencing any fluid symptoms.    Follow-up plan: ICM clinic phone appointment on 03/01/2021.   91 day device clinic remote transmission 04/12/2021.     EP/Cardiology Office Visits: 04/12/2021 with Dr Harl Bowie (establish care).  10/01/2021 with Dr Rayann Heman.     Copy of ICM check sent to Dr. Rayann Heman.    3 month ICM trend: 01/25/2021.    1 Year ICM trend:       Rosalene Billings, RN 01/25/2021 1:35 PM

## 2021-02-04 ENCOUNTER — Ambulatory Visit: Payer: Medicare PPO | Admitting: Orthopaedic Surgery

## 2021-02-04 ENCOUNTER — Other Ambulatory Visit: Payer: Self-pay

## 2021-03-01 ENCOUNTER — Ambulatory Visit (INDEPENDENT_AMBULATORY_CARE_PROVIDER_SITE_OTHER): Payer: Medicare PPO

## 2021-03-01 DIAGNOSIS — Z95810 Presence of automatic (implantable) cardiac defibrillator: Secondary | ICD-10-CM

## 2021-03-01 DIAGNOSIS — I5022 Chronic systolic (congestive) heart failure: Secondary | ICD-10-CM

## 2021-03-02 ENCOUNTER — Telehealth: Payer: Self-pay

## 2021-03-02 NOTE — Telephone Encounter (Signed)
Remote ICM transmission received.  Attempted call to patient regarding ICM remote transmission and left detailed message per DPR.  Advised to return call for any fluid symptoms or questions. Next ICM remote transmission scheduled 04/05/2021.

## 2021-03-02 NOTE — Progress Notes (Signed)
EPIC Encounter for ICM Monitoring  Patient Name: Russell Werner is a 65 y.o. male Date: 03/02/2021 Primary Care Physican: Dettinger, Fransisca Kaufmann, MD Primary Cardiologist: Allred Electrophysiologist: Allred 01/25/2021 Weight 310 lbs                                                          Attempted call to patient and unable to reach.  Left detailed message per DPR regarding transmission. Transmission reviewed.    CorVue thoracic impedance trending close to normal.  Impedance suggesting possible fluid accumulation from 10/3-10/12 and 10/26-10/29.  Also suggested dryness 10/14-10/23.   Prescribed: Spironolactone 25 mg take 1 tablet daily     Labs: 09/24/2020 Creatinine 0.88, BUN 14, Potassium 4.4, Sodium 141, GFR 94 A complete set of results can be found in Results Review.   Recommendations:  Left voice mail with ICM number and encouraged to call if experiencing any fluid symptoms.   Follow-up plan: ICM clinic phone appointment on 04/05/2021.   91 day device clinic remote transmission 04/12/2021.     EP/Cardiology Office Visits: 04/12/2021 with Dr Harl Bowie (establish care).  10/01/2021 with Dr Rayann Heman.     Copy of ICM check sent to Dr. Rayann Heman.     3 month ICM trend: 03/01/2021.    1 Year ICM trend:       Rosalene Billings, RN 03/02/2021 12:36 PM

## 2021-03-14 ENCOUNTER — Other Ambulatory Visit: Payer: Self-pay | Admitting: Internal Medicine

## 2021-03-14 DIAGNOSIS — I5022 Chronic systolic (congestive) heart failure: Secondary | ICD-10-CM

## 2021-03-14 DIAGNOSIS — I10 Essential (primary) hypertension: Secondary | ICD-10-CM

## 2021-03-16 ENCOUNTER — Telehealth: Payer: Self-pay | Admitting: Family Medicine

## 2021-03-16 NOTE — Telephone Encounter (Signed)
No answer unable to leave a message for patient to call back and schedule Medicare Annual Wellness Visit (AWV) to be completed by video or phone.   Last AWV: 12/30/2009 per [facility]  Please schedule at anytime with [facility] Health Advisor.  45 minute appointment  Any questions, please contact me at [phone]

## 2021-03-22 ENCOUNTER — Telehealth: Payer: Self-pay | Admitting: Family Medicine

## 2021-03-22 ENCOUNTER — Ambulatory Visit: Payer: Medicare PPO

## 2021-03-22 ENCOUNTER — Other Ambulatory Visit: Payer: Self-pay

## 2021-03-22 ENCOUNTER — Telehealth: Payer: Self-pay

## 2021-03-22 ENCOUNTER — Ambulatory Visit (INDEPENDENT_AMBULATORY_CARE_PROVIDER_SITE_OTHER): Payer: Medicare PPO | Admitting: Family Medicine

## 2021-03-22 ENCOUNTER — Encounter: Payer: Self-pay | Admitting: Family Medicine

## 2021-03-22 VITALS — BP 114/76 | HR 101 | Ht 70.0 in | Wt 317.0 lb

## 2021-03-22 DIAGNOSIS — B3749 Other urogenital candidiasis: Secondary | ICD-10-CM

## 2021-03-22 DIAGNOSIS — I10 Essential (primary) hypertension: Secondary | ICD-10-CM

## 2021-03-22 DIAGNOSIS — I5042 Chronic combined systolic (congestive) and diastolic (congestive) heart failure: Secondary | ICD-10-CM

## 2021-03-22 DIAGNOSIS — R3914 Feeling of incomplete bladder emptying: Secondary | ICD-10-CM | POA: Diagnosis not present

## 2021-03-22 DIAGNOSIS — E782 Mixed hyperlipidemia: Secondary | ICD-10-CM | POA: Diagnosis not present

## 2021-03-22 DIAGNOSIS — I428 Other cardiomyopathies: Secondary | ICD-10-CM | POA: Diagnosis not present

## 2021-03-22 DIAGNOSIS — N401 Enlarged prostate with lower urinary tract symptoms: Secondary | ICD-10-CM

## 2021-03-22 MED ORDER — FLUCONAZOLE 150 MG PO TABS: 150.0000 mg | ORAL_TABLET | ORAL | 0 refills | Status: DC

## 2021-03-22 MED ORDER — FUROSEMIDE 20 MG PO TABS: 20.0000 mg | ORAL_TABLET | Freq: Every day | ORAL | 0 refills | Status: DC

## 2021-03-22 NOTE — Telephone Encounter (Signed)
Received a phone call from patient.  He reports PCP prescribed Lasix 20 mg daily since he has been having fluid symptoms.  He was drinking ham, chicken and beef bouillon broth and discovered it was high in salt.  He has changed his diet and taking the Lasix resolves shortness of breath. ICM check scheduled for 12/5.

## 2021-03-22 NOTE — Telephone Encounter (Signed)
Pt had visit with Dr Dettinger this morning and called and said he forgot to ask provider if he could be prescribed Lasix.  Please advise and call patient.

## 2021-03-22 NOTE — Telephone Encounter (Signed)
Breathing is labored  Hx of COPD, CHF, has defibrillator  No swelling in LE  20mg  Lasix requested until he sees cardio. He does not have an appt yet

## 2021-03-22 NOTE — Telephone Encounter (Signed)
I sent in some lasix for him, make sure he is taking daily weights, there is also a chance it could be his COPD and not his CHF causing this but I did send in some Lasix.  If it does not improve or worsens then please have him come back for reevaluation, which she would have to mention this in the visit because then I could have looked into it a little bit further.  I did not hear anything on his lungs though on exam.

## 2021-03-22 NOTE — Progress Notes (Signed)
BP 114/76   Pulse (!) 101   Ht '5\' 10"'  (1.778 m)   Wt (!) 317 lb (143.8 kg)   SpO2 96%   BMI 45.48 kg/m    Subjective:   Patient ID: Russell Werner, male    DOB: 26-Feb-1956, 65 y.o.   MRN: [id number]  HPI: Russell Werner is a 65 y.o. male presenting on 03/22/2021 for Medical Management of Chronic Issues, Hyperlipidemia, and Hypertension   HPI Hypertension and CHF Patient is currently on Entresto and spironolactone and bisoprolol, and their blood pressure today is 114/76. Patient denies any lightheadedness or dizziness. Patient denies headaches, blurred vision, chest pains, shortness of breath, or weakness. Denies any side effects from medication and is content with current medication.   Hyperlipidemia Patient is coming in for recheck of his hyperlipidemia. The patient is currently taking Crestor. They deny any issues with myalgias or history of liver damage from it. They deny any focal numbness or weakness or chest pain.   BPH Patient is coming in for recheck on BPH Symptoms: None currently Medication: Finasteride Last PSA: Over a year ago, will do today  Relevant past medical, surgical, family and social history reviewed and updated as indicated. Interim medical history since our last visit reviewed. Allergies and medications reviewed and updated.  Review of Systems  Constitutional:  Negative for chills and fever.  Eyes:  Negative for discharge and visual disturbance.  Respiratory:  Negative for shortness of breath and wheezing.   Cardiovascular:  Negative for chest pain and leg swelling.  Musculoskeletal:  Negative for back pain and gait problem.  Skin:  Positive for rash.  Neurological:  Negative for dizziness, weakness and light-headedness.  Psychiatric/Behavioral:  Positive for sleep disturbance (Not doing as well with his sleep machine, recommend he go back to his sleep doctor.). Negative for confusion, decreased concentration and dysphoric mood.   All other systems  reviewed and are negative.  Per HPI unless specifically indicated above   Allergies as of 03/22/2021   No Known Allergies      Medication List        Accurate as of March 22, 2021  9:47 AM. If you have any questions, ask your nurse or doctor.          STOP taking these medications    nystatin ointment Commonly known as: MYCOSTATIN Stopped by: Fransisca Kaufmann Antha Niday, MD       TAKE these medications    bisoprolol 5 MG tablet Commonly known as: ZEBETA TAKE 1 TABLET (5 MG TOTAL) BY MOUTH DAILY.   Entresto 24-26 MG Generic drug: sacubitril-valsartan Take 1 tablet by mouth 2 (two) times daily.   finasteride 5 MG tablet Commonly known as: PROSCAR Take 1 tablet (5 mg total) by mouth daily.   fluconazole 150 MG tablet Commonly known as: Diflucan Take 1 tablet (150 mg total) by mouth once a week. Started by: Worthy Rancher, MD   multivitamin capsule Take 1 capsule by mouth 2 (two) times daily.   rosuvastatin 20 MG tablet Commonly known as: CRESTOR Take 1 tablet (20 mg total) by mouth daily.   spironolactone 25 MG tablet Commonly known as: ALDACTONE TAKE 1 TABLET EVERY DAY   traZODone 50 MG tablet Commonly known as: DESYREL Take 1 tablet (50 mg total) by mouth at bedtime.   vitamin C 1000 MG tablet Take 2,000 mg by mouth daily.   VITAMIN D PO Take 1 tablet by mouth daily.  Objective:   BP 114/76   Pulse (!) 101   Ht '5\' 10"'  (1.778 m)   Wt (!) 317 lb (143.8 kg)   SpO2 96%   BMI 45.48 kg/m   Wt Readings from Last 3 Encounters:  03/22/21 (!) 317 lb (143.8 kg)  10/09/20 (!) 320 lb 3.2 oz (145.2 kg)  09/24/20 (!) 319 lb (144.7 kg)    Physical Exam Vitals and nursing note reviewed.  Constitutional:      General: He is not in acute distress.    Appearance: He is well-developed. He is not diaphoretic.  Eyes:     General: No scleral icterus.    Conjunctiva/sclera: Conjunctivae normal.  Neck:     Thyroid: No thyromegaly.   Cardiovascular:     Rate and Rhythm: Normal rate and regular rhythm.     Heart sounds: Normal heart sounds. No murmur heard. Pulmonary:     Effort: Pulmonary effort is normal. No respiratory distress.     Breath sounds: Normal breath sounds. No wheezing.  Musculoskeletal:        General: No swelling. Normal range of motion.     Cervical back: Neck supple.  Lymphadenopathy:     Cervical: No cervical adenopathy.  Neurological:     Mental Status: He is alert and oriented to person, place, and time.     Coordination: Coordination normal.  Psychiatric:        Behavior: Behavior normal.      Assessment & Plan:   Problem List Items Addressed This Visit       Cardiovascular and Mediastinum   Nonischemic cardiomyopathy ([employer])   CHF (congestive heart failure) ([place])   Hypertension - Primary   Relevant Orders   CBC with Differential/Platelet   CMP14+EGFR   Lipid panel   TSH     Genitourinary   BPH (benign prostatic hyperplasia)   Relevant Orders   PSA, total and free     Other   Hyperlipidemia   Relevant Orders   CBC with Differential/Platelet   CMP14+EGFR   Lipid panel   TSH   Other Visit Diagnoses     Yeast dermatitis of penis       Relevant Medications   fluconazole (DIFLUCAN) 150 MG tablet     Patient does have some 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  With his penis recommended trying Diflucan weekly for 3 times along with the cream, if still not better then will recommend going to urology.  Blood pressure looks good, will check blood work today  Follow up plan: Return in about 6 months (around 09/19/2021), or if symptoms worsen or fail to improve, for Hypertension and hyperlipidemia.  Counseling provided for all of the vaccine components Orders Placed This Encounter  Procedures   CBC with Differential/Platelet   CMP14+EGFR   Lipid panel   PSA, total and free   TSH    Caryl Pina,  MD [facility] 03/22/2021, 9:47 AM

## 2021-03-22 NOTE — Telephone Encounter (Signed)
Pt made aware and understood 

## 2021-03-22 NOTE — Telephone Encounter (Signed)
I do not see that he is ever been on Lasix, has he been on it in the past from somewhere else, what makes him ask for the Lasix, what issue is he having. Russell Pina, MD [facility] 03/22/2021, 12:49 PM

## 2021-03-23 LAB — CBC WITH DIFFERENTIAL/PLATELET
Basophils Absolute: 0.1 10*3/uL (ref 0.0–0.2)
Basos: 1 %
EOS (ABSOLUTE): 0.2 10*3/uL (ref 0.0–0.4)
Eos: 2 %
Hematocrit: 43.5 % (ref 37.5–51.0)
Hemoglobin: 13.9 g/dL (ref 13.0–17.7)
Immature Grans (Abs): 0 10*3/uL (ref 0.0–0.1)
Immature Granulocytes: 0 %
Lymphocytes Absolute: 2.1 10*3/uL (ref 0.7–3.1)
Lymphs: 26 %
MCH: 28.8 pg (ref 26.6–33.0)
MCHC: 32 g/dL (ref 31.5–35.7)
MCV: 90 fL (ref 79–97)
Monocytes Absolute: 0.4 10*3/uL (ref 0.1–0.9)
Monocytes: 5 %
Neutrophils Absolute: 5.1 10*3/uL (ref 1.4–7.0)
Neutrophils: 66 %
Platelets: 219 10*3/uL (ref 150–450)
RBC: 4.83 x10E6/uL (ref 4.14–5.80)
RDW: 12.7 % (ref 11.6–15.4)
WBC: 7.8 10*3/uL (ref 3.4–10.8)

## 2021-03-23 LAB — CMP14+EGFR
ALT: 14 IU/L (ref 0–44)
AST: 12 IU/L (ref 0–40)
Albumin/Globulin Ratio: 2 (ref 1.2–2.2)
Albumin: 4.1 g/dL (ref 3.8–4.8)
Alkaline Phosphatase: 63 IU/L (ref 44–121)
BUN/Creatinine Ratio: 18 (ref 10–24)
BUN: 17 mg/dL (ref 8–27)
Bilirubin Total: 0.7 mg/dL (ref 0.0–1.2)
CO2: 27 mmol/L (ref 20–29)
Calcium: 9.3 mg/dL (ref 8.6–10.2)
Chloride: 102 mmol/L (ref 96–106)
Creatinine, Ser: 0.92 mg/dL (ref 0.76–1.27)
Globulin, Total: 2.1 g/dL (ref 1.5–4.5)
Glucose: 140 mg/dL — ABNORMAL HIGH (ref 70–99)
Potassium: 4.6 mmol/L (ref 3.5–5.2)
Sodium: 143 mmol/L (ref 134–144)
Total Protein: 6.2 g/dL (ref 6.0–8.5)
eGFR: 91 mL/min/{1.73_m2} (ref >59)

## 2021-03-23 LAB — TSH: TSH: 2.3 u[IU]/mL (ref 0.450–4.500)

## 2021-03-23 LAB — LIPID PANEL
Chol/HDL Ratio: 3.2 ratio (ref 0.0–5.0)
Cholesterol, Total: 146 mg/dL (ref 100–199)
HDL: 46 mg/dL (ref >39)
LDL Chol Calc (NIH): 76 mg/dL (ref 0–99)
Triglycerides: 134 mg/dL (ref 0–149)
VLDL Cholesterol Cal: 24 mg/dL (ref 5–40)

## 2021-03-23 LAB — PSA, TOTAL AND FREE
PSA, Free Pct: 30 %
PSA, Free: 0.06 ng/mL
Prostate Specific Ag, Serum: 0.2 ng/mL (ref 0.0–4.0)

## 2021-04-05 ENCOUNTER — Ambulatory Visit (INDEPENDENT_AMBULATORY_CARE_PROVIDER_SITE_OTHER): Payer: Medicare PPO

## 2021-04-05 DIAGNOSIS — I5022 Chronic systolic (congestive) heart failure: Secondary | ICD-10-CM

## 2021-04-05 DIAGNOSIS — Z95810 Presence of automatic (implantable) cardiac defibrillator: Secondary | ICD-10-CM | POA: Diagnosis not present

## 2021-04-09 ENCOUNTER — Telehealth: Payer: Self-pay

## 2021-04-09 NOTE — Telephone Encounter (Signed)
Remote ICM transmission received.  Attempted call to patient regarding ICM remote transmission and left detailed message per DPR.  Advised to return call for any fluid symptoms or questions. Next ICM remote transmission scheduled 05/10/2021.    

## 2021-04-09 NOTE — Progress Notes (Signed)
EPIC Encounter for ICM Monitoring  Patient Name: Russell Werner is a 65 y.o. male Date: 04/09/2021 Primary Care Physican: Dettinger, Fransisca Kaufmann, MD Primary Cardiologist: Allred Electrophysiologist: Allred 01/25/2021 Weight 310 lbs                                                          Attempted call to patient and unable to reach.  Left detailed message per DPR regarding transmission. Transmission reviewed.    CorVue thoracic impedance suggesting normal fluids.   Prescribed: Spironolactone 25 mg take 1 tablet daily     Labs: 09/24/2020 Creatinine 0.88, BUN 14, Potassium 4.4, Sodium 141, GFR 94 A complete set of results can be found in Results Review.   Recommendations:  Left voice mail with ICM number and encouraged to call if experiencing any fluid symptoms.   Follow-up plan: ICM clinic phone appointment on 05/10/2021.   91 day device clinic remote transmission 04/12/2021.     EP/Cardiology Office Visits: 04/12/2021 with Dr Harl Bowie (establish care).  10/01/2021 with Dr Rayann Heman.     Copy of ICM check sent to Dr. Rayann Heman.    3 month ICM trend: 04/04/2021.    Rosalene Billings, RN 04/09/2021 10:14 AM

## 2021-04-12 ENCOUNTER — Ambulatory Visit: Payer: Medicare PPO | Admitting: Cardiology

## 2021-04-12 ENCOUNTER — Ambulatory Visit (INDEPENDENT_AMBULATORY_CARE_PROVIDER_SITE_OTHER): Payer: Medicare PPO

## 2021-04-12 DIAGNOSIS — I5022 Chronic systolic (congestive) heart failure: Secondary | ICD-10-CM | POA: Diagnosis not present

## 2021-04-13 LAB — CUP PACEART REMOTE DEVICE CHECK
Battery Remaining Longevity: 70 mo
Battery Remaining Percentage: 66 %
Battery Voltage: 2.98 V
Brady Statistic RV Percent Paced: 1 %
Date Time Interrogation Session: 20221212210248
HighPow Impedance: 73 Ohm
HighPow Impedance: 73 Ohm
Implantable Lead Implant Date: 20190102
Implantable Lead Location: 753860
Implantable Pulse Generator Implant Date: 20190102
Lead Channel Impedance Value: 610 Ohm
Lead Channel Pacing Threshold Amplitude: 0.5 V
Lead Channel Pacing Threshold Pulse Width: 0.5 ms
Lead Channel Sensing Intrinsic Amplitude: 11.5 mV
Lead Channel Setting Pacing Amplitude: 2.5 V
Lead Channel Setting Pacing Pulse Width: 0.5 ms
Lead Channel Setting Sensing Sensitivity: 0.5 mV
Pulse Gen Serial Number: 9786935

## 2021-04-20 NOTE — Progress Notes (Signed)
Remote ICD transmission.   

## 2021-04-27 DIAGNOSIS — G4733 Obstructive sleep apnea (adult) (pediatric): Secondary | ICD-10-CM | POA: Diagnosis not present

## 2021-05-10 ENCOUNTER — Ambulatory Visit (INDEPENDENT_AMBULATORY_CARE_PROVIDER_SITE_OTHER): Payer: Medicare PPO

## 2021-05-10 DIAGNOSIS — I5022 Chronic systolic (congestive) heart failure: Secondary | ICD-10-CM

## 2021-05-10 DIAGNOSIS — Z95810 Presence of automatic (implantable) cardiac defibrillator: Secondary | ICD-10-CM | POA: Diagnosis not present

## 2021-05-14 ENCOUNTER — Telehealth: Payer: Self-pay

## 2021-05-14 NOTE — Progress Notes (Signed)
EPIC Encounter for ICM Monitoring  Patient Name: Russell Werner is a 66 y.o. male Date: 05/14/2021 Primary Care Physican: Dettinger, Fransisca Kaufmann, MD Primary Cardiologist: Allred Electrophysiologist: Allred 01/25/2021 Weight 310 lbs                                                          Attempted call to patient and unable to reach.  Left detailed message per DPR regarding transmission. Transmission reviewed.    CorVue thoracic impedance normal but was suggesting possible fluid accumulation from 12/13-12/26.   Prescribed: Spironolactone 25 mg take 1 tablet daily     Labs: 09/24/2020 Creatinine 0.88, BUN 14, Potassium 4.4, Sodium 141, GFR 94 A complete set of results can be found in Results Review.   Recommendations:  Left voice mail with ICM number and encouraged to call if experiencing any fluid symptoms.   Follow-up plan: ICM clinic phone appointment on 06/14/2021.   91 day device clinic remote transmission 07/12/2021.     EP/Cardiology Office Visits: 07/06/2021 with Dr Harl Bowie (establish care).  10/01/2021 with Dr Rayann Heman.     Copy of ICM check sent to Dr. Rayann Heman.    3 month ICM trend: 05/10/2021.    12-14 Month ICM trend:     Rosalene Billings, RN 05/14/2021 10:47 AM

## 2021-05-14 NOTE — Telephone Encounter (Signed)
Remote ICM transmission received.  Attempted call to patient regarding ICM remote transmission and left detailed message per DPR.  Advised to return call for any fluid symptoms or questions. Next ICM remote transmission scheduled 06/14/2021.

## 2021-05-21 ENCOUNTER — Telehealth: Payer: Self-pay | Admitting: Family Medicine

## 2021-05-21 NOTE — Telephone Encounter (Signed)
Left message for patient to call back and schedule Medicare Annual Wellness Visit (AWV) to be completed by video or phone.   Last AWV: 01/01/2011 [facility] per [place]  Please schedule at anytime with [place]  45 minute appointment  Any questions, please contact me at [phone]

## 2021-05-28 DIAGNOSIS — G4733 Obstructive sleep apnea (adult) (pediatric): Secondary | ICD-10-CM | POA: Diagnosis not present

## 2021-06-14 ENCOUNTER — Ambulatory Visit (INDEPENDENT_AMBULATORY_CARE_PROVIDER_SITE_OTHER): Payer: Medicare PPO

## 2021-06-14 DIAGNOSIS — Z95810 Presence of automatic (implantable) cardiac defibrillator: Secondary | ICD-10-CM

## 2021-06-14 DIAGNOSIS — I5022 Chronic systolic (congestive) heart failure: Secondary | ICD-10-CM

## 2021-06-15 ENCOUNTER — Telehealth: Payer: Self-pay

## 2021-06-15 NOTE — Telephone Encounter (Signed)
Remote ICM transmission received.  Attempted call to patient regarding ICM remote transmission and left detailed message per DPR.  Advised to return call for any fluid symptoms or questions. Next ICM remote transmission scheduled 07/19/2021.   ° ° °

## 2021-06-15 NOTE — Progress Notes (Signed)
EPIC Encounter for ICM Monitoring  Patient Name: Russell Werner is a 66 y.o. male Date: 06/15/2021 Primary Care Physican: Dettinger, Fransisca Kaufmann, MD Primary Cardiologist: Allred Electrophysiologist: Allred 01/25/2021 Weight 310 lbs                                                          Attempted call to patient and unable to reach.  Left detailed message per DPR regarding transmission. Transmission reviewed.    CorVue thoracic impedance normal but was suggesting possible fluid accumulation from 1/29-2/12.   Prescribed: Spironolactone 25 mg take 1 tablet daily     Labs: 09/24/2020 Creatinine 0.88, BUN 14, Potassium 4.4, Sodium 141, GFR 94 A complete set of results can be found in Results Review.   Recommendations:  Left voice mail with ICM number and encouraged to call if experiencing any fluid symptoms.   Follow-up plan: ICM clinic phone appointment on 07/19/2021.   91 day device clinic remote transmission 07/12/2021.     EP/Cardiology Office Visits: 07/06/2021 with Dr Harl Bowie (establish care).  10/01/2021 with Dr Rayann Heman.     Copy of ICM check sent to Dr. Rayann Heman.    3 month ICM trend: 06/14/2021.    12-14 Month ICM trend:     Rosalene Billings, RN 06/15/2021 12:48 PM

## 2021-06-20 DIAGNOSIS — R0689 Other abnormalities of breathing: Secondary | ICD-10-CM | POA: Diagnosis not present

## 2021-06-20 DIAGNOSIS — Z95810 Presence of automatic (implantable) cardiac defibrillator: Secondary | ICD-10-CM | POA: Diagnosis not present

## 2021-06-20 DIAGNOSIS — I110 Hypertensive heart disease with heart failure: Secondary | ICD-10-CM | POA: Diagnosis not present

## 2021-06-20 DIAGNOSIS — G4733 Obstructive sleep apnea (adult) (pediatric): Secondary | ICD-10-CM | POA: Diagnosis not present

## 2021-06-20 DIAGNOSIS — E669 Obesity, unspecified: Secondary | ICD-10-CM | POA: Diagnosis not present

## 2021-06-20 DIAGNOSIS — R0602 Shortness of breath: Secondary | ICD-10-CM | POA: Diagnosis not present

## 2021-06-20 DIAGNOSIS — I509 Heart failure, unspecified: Secondary | ICD-10-CM | POA: Diagnosis not present

## 2021-06-21 ENCOUNTER — Ambulatory Visit (INDEPENDENT_AMBULATORY_CARE_PROVIDER_SITE_OTHER): Payer: Medicare PPO

## 2021-06-21 ENCOUNTER — Telehealth: Payer: Self-pay

## 2021-06-21 VITALS — Ht 69.0 in | Wt 310.0 lb

## 2021-06-21 DIAGNOSIS — Z599 Problem related to housing and economic circumstances, unspecified: Secondary | ICD-10-CM

## 2021-06-21 DIAGNOSIS — Z0000 Encounter for general adult medical examination without abnormal findings: Secondary | ICD-10-CM

## 2021-06-21 DIAGNOSIS — Z5941 Food insecurity: Secondary | ICD-10-CM

## 2021-06-21 NOTE — Telephone Encounter (Signed)
Pt with c/o issues of insomnia and anxiety with trying to sleep. Pt states he will wear CPAP but states "I feel like I am smothering" when he wears it. Pt states he does not take the Trazodone because he feels," hung over the next day." Pt asks if there is anything else he can take for sleep and anxiety?

## 2021-06-21 NOTE — Telephone Encounter (Signed)
Pt. Needs to be seen for this. Thanks, WS 

## 2021-06-21 NOTE — Progress Notes (Signed)
Subjective:   Russell Werner is a 66 y.o. male who presents for Medicare Annual/Subsequent preventive examination. Virtual Visit via Telephone Note  I connected with  Russell Werner on 06/21/21 at  9:45 AM EST by telephone and verified that I am speaking with the correct person using two identifiers.  Location: Patient: HOME Provider: [facility] Persons participating in the virtual visit: patient/Nurse Health Advisor   I discussed the limitations, risks, security and privacy concerns of performing an evaluation and management service by telephone and the availability of in person appointments. The patient expressed understanding and agreed to proceed.  Interactive audio and video telecommunications were attempted between this nurse and patient, however failed, due to patient having technical difficulties OR patient did not have access to video capability.  We continued and completed visit with audio only.  Some vital signs may be absent or patient reported.   Russell Driver, LPN  Review of Systems     Cardiac Risk Factors include: advanced age (>37men, >34 women);hypertension;dyslipidemia;male gender;sedentary lifestyle;obesity (BMI >30kg/m2);Other (see comment), Risk factor comments: COPD     Objective:    Today's Vitals   06/21/21 1036 06/21/21 1039  Weight: (!) 310 lb (140.6 kg)   Height: 5\' 9"  (1.753 m)   PainSc:  2    Body mass index is 45.78 kg/m.  Advanced Directives 06/21/2021 10/18/2017 10/02/2017 05/03/2017  Does Patient Have a Medical Advance Directive? No No No No  Would patient like information on creating a medical advance directive? No - Patient declined No - Patient declined No - Patient declined No - Patient declined    Current Medications (verified) Outpatient Encounter Medications as of 06/21/2021  Medication Sig   Ascorbic Acid (VITAMIN C) 1000 MG tablet Take 2,000 mg by mouth daily.   bisoprolol (ZEBETA) 5 MG tablet TAKE 1 TABLET (5 MG TOTAL) BY MOUTH  DAILY.   finasteride (PROSCAR) 5 MG tablet Take 1 tablet (5 mg total) by mouth daily.   Multiple Vitamin (MULTIVITAMIN) capsule Take 1 capsule by mouth 2 (two) times daily.    rosuvastatin (CRESTOR) 20 MG tablet Take 1 tablet (20 mg total) by mouth daily.   sacubitril-valsartan (ENTRESTO) 24-26 MG Take 1 tablet by mouth 2 (two) times daily.   spironolactone (ALDACTONE) 25 MG tablet TAKE 1 TABLET EVERY DAY   traZODone (DESYREL) 50 MG tablet Take 1 tablet (50 mg total) by mouth at bedtime.   VITAMIN D PO Take 1 tablet by mouth daily.   [DISCONTINUED] finasteride (PROSCAR) 5 MG tablet Take by mouth.   [DISCONTINUED] fluconazole (DIFLUCAN) 150 MG tablet Take 1 tablet (150 mg total) by mouth once a week.   [DISCONTINUED] furosemide (LASIX) 20 MG tablet Take 1 tablet (20 mg total) by mouth daily.   No facility-administered encounter medications on file as of 06/21/2021.    Allergies (verified) Patient has no known allergies.   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   Arthritis    "fingers" (05/03/2017)   CHF (congestive heart failure) (HCC)    COPD (chronic obstructive pulmonary disease) ([place])    Coronary artery disease    nonobstructive mid RCA by 2009 cath at [facility] in [place]   Enlarged prostate    High cholesterol    Hypertension    Melanoma of back [facility]) 2013   Meniscal injury    Myocardial infarction ([place]) 1998   mild   OSA on CPAP    "severe" (  05/03/2017)   Right knee meniscal tear    Past Surgical History:  Procedure Laterality Date   CARDIAC CATHETERIZATION  X 2   CARDIAC DEFIBRILLATOR PLACEMENT  05/03/2017   CYST REMOVAL LEG Bilateral 2013   HERNIA REPAIR     ICD IMPLANT N/A 05/03/2017   Procedure: ICD IMPLANT;  Surgeon: Russell Grayer, MD;  Location: [facility];  Service: Cardiovascular;  Laterality: N/A;   KNEE ARTHROSCOPY WITH MEDIAL MENISECTOMY Right 10/18/2017   Procedure:  RIGHT KNEE ARTHROSCOPY WITH PARTIAL MEDIAL MENISCECTOMY;  Surgeon: Russell Koyanagi, MD;  Location: [place];  Service: Orthopedics;  Laterality: Right;   KNEE CARTILAGE SURGERY Left 1972   LAPAROSCOPIC CHOLECYSTECTOMY  2015   LAPAROSCOPIC GASTRIC BAND REMOVAL WITH LAPAROSCOPIC GASTRIC SLEEVE RESECTION  11/05/2014   MELANOMA EXCISION  1324   "back"   UMBILICAL HERNIA REPAIR  2014   Family History  Problem Relation Age of Onset   Early death Mother    Cancer Sister        breast   HIV Brother    Cancer Maternal Grandmother        breast   Stroke Maternal Grandfather    Cancer Sister        melanoma   Social History   Socioeconomic History   Marital status: Divorced    Spouse name: Not on file   Number of children: Not on file   Years of education: Not on file   Highest education level: Not on file  Occupational History   Not on file  Tobacco Use   Smoking status: Former    Packs/day: 1.00    Years: 47.00    Pack years: 47.00    Types: Cigarettes    Start date: 06/24/1967    Quit date: 05/02/2013    Years since quitting: 8.1   Smokeless tobacco: Never  Vaping Use   Vaping Use: Every day  Substance and Sexual Activity   Alcohol use: Yes    Comment:  "1-2 glasses of wine/month"   Drug use: No   Sexual activity: Yes  Other Topics Concern   Not on file  Social History Narrative   Not on file   Social Determinants of Health   Financial Resource Strain: Not on file  Food Insecurity: Food Insecurity Present   Worried About Russell Werner in the Last Year: Sometimes true   Ran Out of Food in the Last Year: Sometimes true  Transportation Needs: No Transportation Needs   Lack of Transportation (Medical): No   Lack of Transportation (Non-Medical): No  Physical Activity: Insufficiently Active   Days of Exercise per Week: 1 day   Minutes of Exercise per Session: 10 min  Stress: Stress Concern Present   Feeling of Stress : Very much  Social Connections: Moderately  Integrated   Frequency of Communication with Friends and Family: Twice a week   Frequency of Social Gatherings with Friends and Family: Once a week   Attends Religious Services: 1 to 4 times per year   Active Member of [employer] or Organizations: Yes   Attends Archivist Meetings: 1 to 4 times per year   Marital Status: Divorced    Tobacco Counseling Counseling given: Not Answered   Clinical Intake:  Pre-visit preparation completed: Yes  Pain : 0-10 Pain Score: 2  Pain Type: Chronic pain Pain Location: Hand Pain Descriptors / Indicators: Aching Pain Onset: More than a month ago Pain Frequency: Intermittent     BMI -  recorded: 45.78 Nutritional Status: BMI > 30  Obese Diabetes: No  How often do you need to have someone help you when you read instructions, pamphlets, or other written materials from your doctor or pharmacy?: 1 - Never  Diabetic?No  Interpreter Needed?: No  Information entered by :: mj Joesph Marcy, lpn   Activities of Daily Living In your present state of health, do you have any difficulty performing the following activities: 06/17/2021  Hearing? N  Vision? N  Difficulty concentrating or making decisions? N  Walking or climbing stairs? N  Dressing or bathing? N  Doing errands, shopping? N  Preparing Food and eating ? N  Using the Toilet? N  In the past six months, have you accidently leaked urine? N  Do you have problems with loss of bowel control? N  Managing your Medications? N  Managing your Finances? N  Housekeeping or managing your Housekeeping? N  Some recent data might be hidden    Patient Care Team: Dettinger, Fransisca Kaufmann, MD as PCP - General (Family Medicine) Herminio Commons, MD (Inactive) as PCP - Cardiology (Cardiology) Danie Binder, MD (Inactive) as Consulting Physician (Gastroenterology) Herminio Commons, MD (Inactive) as Attending Physician (Cardiology)  Indicate any recent Medical Services you may have received from  other than [facility] providers in the past year (date may be approximate).     Assessment:   This is a routine wellness examination for Russell Werner.  Hearing/Vision screen Hearing Screening - Comments:: Some hearing issues.  Vision Screening - Comments:: Readers. Needs Optometry exam, declined at this time.  Dietary issues and exercise activities discussed: Current Exercise Habits: The patient does not participate in regular exercise at present, Exercise limited by: cardiac condition(s);orthopedic condition(s);respiratory conditions(s)   Goals Addressed             This Visit's Progress    Weight (lb) < 200 lb (90.7 kg)   310 lb (140.6 kg)    Become more active and lose weight.       Depression Screen PHQ 2/9 Scores 06/21/2021 06/21/2021 03/22/2021 09/24/2020 02/26/2020 01/27/2020 12/19/2019  PHQ - 2 Score 0 0 0 0 0 0 0  PHQ- 9 Score - - 2 0 - - 0    Fall Risk Fall Risk  06/17/2021 03/22/2021 09/24/2020 02/26/2020 01/27/2020  Falls in the past year? 0 0 1 1 1   Number falls in past yr: 0 - 0 0 0  Injury with Fall? 0 - 1 1 1   Risk for fall due to : No Fall Risks - History of fall(s) Impaired balance/gait -  Follow up Falls prevention discussed - Education provided Falls evaluation completed Falls evaluation completed    [place]:  Any stairs in or around the home? Yes  If so, are there any without handrails? No  Home free of loose throw rugs in walkways, pet beds, electrical cords, etc? Yes  Adequate lighting in your home to reduce risk of falls? Yes   ASSISTIVE DEVICES UTILIZED TO PREVENT FALLS:  Life alert? No  Use of a cane, walker or w/c? No  Grab bars in the bathroom? No  Shower chair or bench in shower? No  Elevated toilet seat or a handicapped toilet? No   TIMED UP AND GO:  Was the test performed? No .  PHONE VISIT.  Cognitive Function:     6CIT Screen 06/21/2021  What Year? 0 points  What month? 0 points  What time? 0 points  Count back from 20 0 points  Months in reverse 0 points  Repeat phrase 0 points  Total Score 0    Immunizations Immunization History  Administered Date(s) Administered   Influenza-Unspecified 12/19/2016    TDAP status: Due, Education has been provided regarding the importance of this vaccine. Advised may receive this vaccine at local pharmacy or Health Dept. Aware to provide a copy of the vaccination record if obtained from local pharmacy or Health Dept. Verbalized acceptance and understanding.  Flu Vaccine status: Declined, Education has been provided regarding the importance of this vaccine but patient still declined. Advised may receive this vaccine at local pharmacy or Health Dept. Aware to provide a copy of the vaccination record if obtained from local pharmacy or Health Dept. Verbalized acceptance and understanding.  Pneumococcal vaccine status: Declined,  Education has been provided regarding the importance of this vaccine but patient still declined. Advised may receive this vaccine at local pharmacy or Health Dept. Aware to provide a copy of the vaccination record if obtained from local pharmacy or Health Dept. Verbalized acceptance and understanding.   Covid-19 vaccine status: Declined, Education has been provided regarding the importance of this vaccine but patient still declined. Advised may receive this vaccine at local pharmacy or Health Dept.or vaccine clinic. Aware to provide a copy of the vaccination record if obtained from local pharmacy or Health Dept. Verbalized acceptance and understanding.  Qualifies for Shingles Vaccine? No   Zostavax completed No   Shingrix Completed?: No.    Education has been provided regarding the importance of this vaccine. Patient has been advised to call insurance company to determine out of pocket expense if they have not yet received this vaccine. Advised may also receive vaccine at local pharmacy or Health Dept. Verbalized acceptance and  understanding.  Screening Tests Health Maintenance  Topic Date Due   INFLUENZA VACCINE  07/30/2021 (Originally 11/30/2020)   TETANUS/TDAP  09/24/2021 (Originally 06/23/1972)   COVID-19 Vaccine (1) 03/21/2022 (Originally 12/21/1955)   Zoster Vaccines- Shingrix (1 of 2) 03/21/2022 (Originally 06/24/2003)   Pneumonia Vaccine 68+ Years old (1 - PCV) 03/22/2022 (Originally 06[birth year]24[birth year]1959)   COLONOSCOPY (Pts 45-41yrs Insurance coverage will need to be confirmed)  10/24/2028   Hepatitis C Screening  Completed   HPV VACCINES  Aged Out    Health Maintenance  There are no preventive care reminders to display for this patient.  Colorectal cancer screening: Type of screening: Colonoscopy. Completed 10/25/2018. Repeat every 10 years  Lung Cancer Screening: (Low Dose CT Chest recommended if Age 70-80 years, 30 pack-year currently smoking OR have quit w/in 15years.) does qualify.   Lung Cancer Screening Referral: Pt was in ER on 06/20/21 and had x-ray done.  Additional Screening:  Hepatitis C Screening: does not qualify; Completed 04/13/2017  Vision Screening: Recommended annual ophthalmology exams for early detection of glaucoma and other disorders of the eye. Is the patient up to date with their annual eye exam?  No  Who is the provider or what is the name of the office in which the patient attends annual eye exams? N/A If pt is not established with a provider, would they like to be referred to a provider to establish care? No .   Dental Screening: Recommended annual dental exams for proper oral hygiene  Community Resource Referral / Chronic Care Management: CRR required this visit?  Yes   CCM required this visit?  No      Plan:     I have personally reviewed and noted the  following in the patients chart:   Medical and social history Use of alcohol, tobacco or illicit drugs  Current medications and supplements including opioid prescriptions. Patient is not currently taking opioid  prescriptions. Functional ability and status Nutritional status Physical activity Advanced directives List of other physicians Hospitalizations, surgeries, and ER visits in previous 12 months Vitals Screenings to include cognitive, depression, and falls Referrals and appointments  In addition, I have reviewed and discussed with patient certain preventive protocols, quality metrics, and best practice recommendations. A written personalized care plan for preventive services as well as general preventive health recommendations were provided to patient.     Russell Driver, LPN   9/[id number]/[id number]   Nurse Notes: Pt with c/o not being able to afford food. Discussed CRR referral and pt is agreeable. Referral made. Pt declined all vaccines.

## 2021-06-21 NOTE — Patient Instructions (Signed)
[name] , Thank you for taking time to come for your Medicare Wellness Visit. I appreciate your ongoing commitment to your health goals. Please review the following plan we discussed and let me know if I can assist you in the future.   Screening recommendations/referrals: Colonoscopy: Done 10/25/2018 Repeat in 10 years  Recommended yearly ophthalmology/optometry visit for glaucoma screening and checkup Recommended yearly dental visit for hygiene and checkup  Vaccinations: Influenza vaccine: Declined.  Pneumococcal vaccine: Declined. Tdap vaccine: Declined.  Shingles vaccine: Declined.   Covid-19: Declined.  Advanced directives: Advance directive discussed with you today. Even though you declined this today, please call our office should you change your mind, and we can give you the proper paperwork for you to fill out.   Conditions/risks identified: Aim for 30 minutes of exercise or brisk walking each day, drink 6-8 glasses of water and eat lots of fruits and vegetables.   Next appointment: Follow up in one year for your annual wellness visit. 2024.  Preventive Care 61 Years and Older, Male  Preventive care refers to lifestyle choices and visits with your health care provider that can promote health and wellness. What does preventive care include? A yearly physical exam. This is also called an annual well check. Dental exams once or twice a year. Routine eye exams. Ask your health care provider how often you should have your eyes checked. Personal lifestyle choices, including: Daily care of your teeth and gums. Regular physical activity. Eating a healthy diet. Avoiding tobacco and drug use. Limiting alcohol use. Practicing safe sex. Taking low doses of aspirin every day. Taking vitamin and mineral supplements as recommended by your health care provider. What happens during an annual well check? The services and screenings done by your health care provider during your  annual well check will depend on your age, overall health, lifestyle risk factors, and family history of disease. Counseling  Your health care provider may ask you questions about your: Alcohol use. Tobacco use. Drug use. Emotional well-being. Home and relationship well-being. Sexual activity. Eating habits. History of falls. Memory and ability to understand (cognition). Work and work Statistician. Screening  You may have the following tests or measurements: Height, weight, and BMI. Blood pressure. Lipid and cholesterol levels. These may be checked every 5 years, or more frequently if you are over 37 years old. Skin check. Lung cancer screening. You may have this screening every year starting at age 41 if you have a 30-pack-year history of smoking and currently smoke or have quit within the past 15 years. Fecal occult blood test (FOBT) of the stool. You may have this test every year starting at age 16. Flexible sigmoidoscopy or colonoscopy. You may have a sigmoidoscopy every 5 years or a colonoscopy every 10 years starting at age 63. Prostate cancer screening. Recommendations will vary depending on your family history and other risks. Hepatitis C blood test. Hepatitis B blood test. Sexually transmitted disease (STD) testing. Diabetes screening. This is done by checking your blood sugar (glucose) after you have not eaten for a while (fasting). You may have this done every 1-3 years. Abdominal aortic aneurysm (AAA) screening. You may need this if you are a current or former smoker. Osteoporosis. You may be screened starting at age 44 if you are at high risk. Talk with your health care provider about your test results, treatment options, and if necessary, the need for more tests. Vaccines  Your health care provider may recommend certain vaccines, such as: Influenza vaccine.  This is recommended every year. Tetanus, diphtheria, and acellular pertussis (Tdap, Td) vaccine. You may need a Td  booster every 10 years. Zoster vaccine. You may need this after age 51. Pneumococcal 13-valent conjugate (PCV13) vaccine. One dose is recommended after age 13. Pneumococcal polysaccharide (PPSV23) vaccine. One dose is recommended after age 73. Talk to your health care provider about which screenings and vaccines you need and how often you need them. This information is not intended to replace advice given to you by your health care provider. Make sure you discuss any questions you have with your health care provider. Document Released: 05/15/2015 Document Revised: 01/06/2016 Document Reviewed: 02/17/2015 Elsevier Interactive Patient Education  2017 [place] Prevention in the Home Falls can cause injuries. They can happen to people of all ages. There are many things you can do to make your home safe and to help prevent falls. What can I do on the outside of my home? Regularly fix the edges of walkways and driveways and fix any cracks. Remove anything that might make you trip as you walk through a door, such as a raised step or threshold. Trim any bushes or trees on the path to your home. Use bright outdoor lighting. Clear any walking paths of anything that might make someone trip, such as rocks or tools. Regularly check to see if handrails are loose or broken. Make sure that both sides of any steps have handrails. Any raised decks and porches should have guardrails on the edges. Have any leaves, snow, or ice cleared regularly. Use sand or salt on walking paths during winter. Clean up any spills in your garage right away. This includes oil or grease spills. What can I do in the bathroom? Use night lights. Install grab bars by the toilet and in the tub and shower. Do not use towel bars as grab bars. Use non-skid mats or decals in the tub or shower. If you need to sit down in the shower, use a plastic, non-slip stool. Keep the floor dry. Clean up any water that spills on the floor  as soon as it happens. Remove soap buildup in the tub or shower regularly. Attach bath mats securely with double-sided non-slip rug tape. Do not have throw rugs and other things on the floor that can make you trip. What can I do in the bedroom? Use night lights. Make sure that you have a light by your bed that is easy to reach. Do not use any sheets or blankets that are too big for your bed. They should not hang down onto the floor. Have a firm chair that has side arms. You can use this for support while you get dressed. Do not have throw rugs and other things on the floor that can make you trip. What can I do in the kitchen? Clean up any spills right away. Avoid walking on wet floors. Keep items that you use a lot in easy-to-reach places. If you need to reach something above you, use a strong step stool that has a grab bar. Keep electrical cords out of the way. Do not use floor polish or wax that makes floors slippery. If you must use wax, use non-skid floor wax. Do not have throw rugs and other things on the floor that can make you trip. What can I do with my stairs? Do not leave any items on the stairs. Make sure that there are handrails on both sides of the stairs and use them. Fix handrails  that are broken or loose. Make sure that handrails are as long as the stairways. Check any carpeting to make sure that it is firmly attached to the stairs. Fix any carpet that is loose or worn. Avoid having throw rugs at the top or bottom of the stairs. If you do have throw rugs, attach them to the floor with carpet tape. Make sure that you have a light switch at the top of the stairs and the bottom of the stairs. If you do not have them, ask someone to add them for you. What else can I do to help prevent falls? Wear shoes that: Do not have high heels. Have rubber bottoms. Are comfortable and fit you well. Are closed at the toe. Do not wear sandals. If you use a stepladder: Make sure that it is  fully opened. Do not climb a closed stepladder. Make sure that both sides of the stepladder are locked into place. Ask someone to hold it for you, if possible. Clearly mark and make sure that you can see: Any grab bars or handrails. First and last steps. Where the edge of each step is. Use tools that help you move around (mobility aids) if they are needed. These include: Canes. Walkers. Scooters. Crutches. Turn on the lights when you go into a dark area. Replace any light bulbs as soon as they burn out. Set up your furniture so you have a clear path. Avoid moving your furniture around. If any of your floors are uneven, fix them. If there are any pets around you, be aware of where they are. Review your medicines with your doctor. Some medicines can make you feel dizzy. This can increase your chance of falling. Ask your doctor what other things that you can do to help prevent falls. This information is not intended to replace advice given to you by your health care provider. Make sure you discuss any questions you have with your health care provider. Document Released: 02/12/2009 Document Revised: 09/24/2015 Document Reviewed: 05/23/2014 Elsevier Interactive Patient Education  2017 [employer].

## 2021-06-21 NOTE — Telephone Encounter (Signed)
Appt made with pcp patient aware

## 2021-06-23 ENCOUNTER — Telehealth: Payer: Self-pay | Admitting: *Deleted

## 2021-06-23 NOTE — Telephone Encounter (Signed)
° °  Telephone encounter was:  Successful.  06/23/2021 Name: Russell Werner MRN: [id number] DOB: 06/04/1955  [name] is a 66 y.o. year old male who is a primary care patient of Dettinger, Fransisca Kaufmann, MD . The community resource team was consulted for assistance with Food Insecurity and medical bills  Care guide performed the following interventions: Patient provided with information about care guide support team and interviewed to confirm resource needs.  Follow Patient returned phone call and was asking about the hardship program has a few thousand in bills offer to send paperwork he refused, Patient refused food banks and said he was appreciative    No further follow up planned at this time. The patient has been provided with needed resources. [facility], Care Management  [phone] [address] , [place] [address] Email : [email or web address]

## 2021-06-25 DIAGNOSIS — Z6841 Body Mass Index (BMI) 40.0 and over, adult: Secondary | ICD-10-CM | POA: Diagnosis not present

## 2021-06-25 DIAGNOSIS — I509 Heart failure, unspecified: Secondary | ICD-10-CM | POA: Diagnosis not present

## 2021-06-25 DIAGNOSIS — I517 Cardiomegaly: Secondary | ICD-10-CM | POA: Diagnosis not present

## 2021-06-25 DIAGNOSIS — G4733 Obstructive sleep apnea (adult) (pediatric): Secondary | ICD-10-CM | POA: Diagnosis not present

## 2021-06-25 DIAGNOSIS — J439 Emphysema, unspecified: Secondary | ICD-10-CM | POA: Diagnosis not present

## 2021-06-25 DIAGNOSIS — I4892 Unspecified atrial flutter: Secondary | ICD-10-CM | POA: Diagnosis not present

## 2021-06-25 DIAGNOSIS — I10 Essential (primary) hypertension: Secondary | ICD-10-CM | POA: Diagnosis not present

## 2021-06-25 DIAGNOSIS — I429 Cardiomyopathy, unspecified: Secondary | ICD-10-CM | POA: Diagnosis not present

## 2021-06-25 DIAGNOSIS — Z95810 Presence of automatic (implantable) cardiac defibrillator: Secondary | ICD-10-CM | POA: Diagnosis not present

## 2021-06-28 DIAGNOSIS — G4733 Obstructive sleep apnea (adult) (pediatric): Secondary | ICD-10-CM | POA: Diagnosis not present

## 2021-07-05 ENCOUNTER — Ambulatory Visit (INDEPENDENT_AMBULATORY_CARE_PROVIDER_SITE_OTHER): Payer: Medicare PPO | Admitting: Family Medicine

## 2021-07-05 ENCOUNTER — Encounter: Payer: Self-pay | Admitting: Family Medicine

## 2021-07-05 VITALS — BP 89/62 | HR 73 | Temp 97.9°F | Ht 69.0 in | Wt 305.5 lb

## 2021-07-05 DIAGNOSIS — Z9989 Dependence on other enabling machines and devices: Secondary | ICD-10-CM

## 2021-07-05 DIAGNOSIS — E8881 Metabolic syndrome: Secondary | ICD-10-CM

## 2021-07-05 DIAGNOSIS — G4733 Obstructive sleep apnea (adult) (pediatric): Secondary | ICD-10-CM

## 2021-07-05 LAB — BAYER DCA HB A1C WAIVED: HB A1C (BAYER DCA - WAIVED): 5.8 % — ABNORMAL HIGH (ref 4.8–5.6)

## 2021-07-05 MED ORDER — SEMAGLUTIDE-WEIGHT MANAGEMENT 0.5 MG/0.5ML ~~LOC~~ SOAJ: 0.5000 mg | SUBCUTANEOUS | 0 refills | Status: AC

## 2021-07-05 MED ORDER — SEMAGLUTIDE-WEIGHT MANAGEMENT 1 MG/0.5ML ~~LOC~~ SOAJ: 1.0000 mg | SUBCUTANEOUS | 0 refills | Status: AC

## 2021-07-05 MED ORDER — SEMAGLUTIDE-WEIGHT MANAGEMENT 1.7 MG/0.75ML ~~LOC~~ SOAJ: 1.7000 mg | SUBCUTANEOUS | 0 refills | Status: DC

## 2021-07-05 MED ORDER — SEMAGLUTIDE-WEIGHT MANAGEMENT 0.25 MG/0.5ML ~~LOC~~ SOAJ: 0.2500 mg | SUBCUTANEOUS | 0 refills | Status: AC

## 2021-07-05 MED ORDER — SEMAGLUTIDE-WEIGHT MANAGEMENT 2.4 MG/0.75ML ~~LOC~~ SOAJ: 2.4000 mg | SUBCUTANEOUS | 0 refills | Status: DC

## 2021-07-05 NOTE — Progress Notes (Signed)
? ?BP (!) 89/62   Pulse 73   Temp 97.9 ?F (36.6 ?C) (Temporal)   Ht '5\' 9"'$  (1.753 m)   Wt (!) 305 lb 8 oz (138.6 kg)   SpO2 96%   BMI 45.11 kg/m?   ? ?Subjective:  ? ?Patient ID: Russell Werner, male    DOB: June 21, 1955, 66 y.o.   MRN: [id number] ? ?HPI: ?Russell Werner is a 66 y.o. male presenting on 07/05/2021 for Insomnia ? ? ?HPI ?Insomnia and anxiety and sleep apnea ?He gets anxiety about sleeping and has to sleep in a recliner because of his severe sleep apnea.  He cannot use the machine because he feels congested and has felt on it.  He did have a referral for a new sleep doctor but it was too far away from home because he lives in [place] and it was in [place].  He would like to see if he can get a sleep doctor closer to home in [place].  He does say that he does stop breathing when he snores when he was in his bed so he has been sleeping in recliner more frequently.  He is using melatonin Gummies and does feel like that is doing better than the trazodone. ? ?He wants to try weight loss medicine and see if it is covered by his insurance.  He does have metabolic syndrome with a history of bariatric surgery.  He has still gained weight from that.  He also has congestive heart failure and hypertension and sleep apnea and due to his weight. ? ?Relevant past medical, surgical, family and social history reviewed and updated as indicated. Interim medical history since our last visit reviewed. ?Allergies and medications reviewed and updated. ? ?Review of Systems  ?Constitutional:  Negative for chills and fever.  ?Respiratory:  Negative for shortness of breath and wheezing.   ?Cardiovascular:  Negative for chest pain and leg swelling.  ?Musculoskeletal:  Negative for back pain and gait problem.  ?Skin:  Negative for rash.  ?Neurological:  Negative for dizziness, weakness and light-headedness.  ?Psychiatric/Behavioral:  Positive for sleep disturbance. Negative for self-injury and suicidal ideas. The patient is  nervous/anxious.   ?All other systems reviewed and are negative. ? ?Per HPI unless specifically indicated above ? ? ?Allergies as of 07/05/2021   ?No Known Allergies ?  ? ?  ?Medication List  ?  ? ?  ? Accurate as of July 05, 2021  9:45 AM. If you have any questions, ask your nurse or doctor.  ?  ?  ? ?  ? ?STOP taking these medications   ? ?traZODone 50 MG tablet ?Commonly known as: DESYREL ?Stopped by: Worthy Rancher, MD ?  ? ?  ? ?TAKE these medications   ? ?bisoprolol 5 MG tablet ?Commonly known as: ZEBETA ?TAKE 1 TABLET (5 MG TOTAL) BY MOUTH DAILY. ?  ?Entresto 24-26 MG ?Generic drug: sacubitril-valsartan ?Take 1 tablet by mouth 2 (two) times daily. ?  ?finasteride 5 MG tablet ?Commonly known as: PROSCAR ?Take 1 tablet (5 mg total) by mouth daily. ?  ?multivitamin capsule ?Take 1 capsule by mouth 2 (two) times daily. ?  ?rosuvastatin 20 MG tablet ?Commonly known as: CRESTOR ?Take 1 tablet (20 mg total) by mouth daily. ?  ?Semaglutide-Weight Management 0.25 MG/0.5ML Soaj ?Inject 0.25 mg into the skin once a week for 28 days. ?Started by: Worthy Rancher, MD ?  ?Semaglutide-Weight Management 0.5 MG/0.5ML Soaj ?Inject 0.5 mg into the skin once a week for  28 days. ?Start taking on: August 03, 2021 ?Started by: Worthy Rancher, MD ?  ?Semaglutide-Weight Management 1 MG/0.5ML Soaj ?Inject 1 mg into the skin once a week for 28 days. ?Start taking on: Sep 01, 2021 ?Started by: Worthy Rancher, MD ?  ?Semaglutide-Weight Management 1.7 MG/0.75ML Soaj ?Inject 1.7 mg into the skin once a week for 28 days. ?Start taking on: September 30, 2021 ?Started by: Worthy Rancher, MD ?  ?Semaglutide-Weight Management 2.4 MG/0.75ML Soaj ?Inject 2.4 mg into the skin once a week for 28 days. ?Start taking on: October 29, 2021 ?Started by: Worthy Rancher, MD ?  ?spironolactone 25 MG tablet ?Commonly known as: ALDACTONE ?TAKE 1 TABLET EVERY DAY ?  ?vitamin C 1000 MG tablet ?Take 2,000 mg by mouth daily. ?  ?VITAMIN D PO ?Take 1  tablet by mouth daily. ?  ? ?  ? ? ? ?Objective:  ? ?BP (!) 89/62   Pulse 73   Temp 97.9 ?F (36.6 ?C) (Temporal)   Ht '5\' 9"'$  (1.753 m)   Wt (!) 305 lb 8 oz (138.6 kg)   SpO2 96%   BMI 45.11 kg/m?   ?Wt Readings from Last 3 Encounters:  ?07/05/21 (!) 305 lb 8 oz (138.6 kg)  ?06/21/21 (!) 310 lb (140.6 kg)  ?03/22/21 (!) 317 lb (143.8 kg)  ?  ?Physical Exam ?Vitals and nursing note reviewed.  ?Constitutional:   ?   General: He is not in acute distress. ?   Appearance: He is well-developed. He is obese. He is not diaphoretic.  ?Eyes:  ?   General: No scleral icterus. ?   Conjunctiva/sclera: Conjunctivae normal.  ?Neck:  ?   Thyroid: No thyromegaly.  ?Cardiovascular:  ?   Rate and Rhythm: Normal rate and regular rhythm.  ?   Heart sounds: Normal heart sounds. No murmur heard. ?Pulmonary:  ?   Effort: Pulmonary effort is normal. No respiratory distress.  ?   Breath sounds: Normal breath sounds. No wheezing.  ?Musculoskeletal:     ?   General: Normal range of motion.  ?   Cervical back: Neck supple.  ?Lymphadenopathy:  ?   Cervical: No cervical adenopathy.  ?Skin: ?   General: Skin is warm and dry.  ?   Findings: No rash.  ?Neurological:  ?   Mental Status: He is alert and oriented to person, place, and time.  ?   Coordination: Coordination normal.  ?Psychiatric:     ?   Behavior: Behavior normal.  ? ? ? ? ?Assessment & Plan:  ? ?Problem List Items Addressed This Visit   ? ?  ? Respiratory  ? Obstructive sleep apnea treated with continuous positive airway pressure (CPAP) - Primary  ? Relevant Medications  ? Semaglutide-Weight Management 0.25 MG/0.5ML SOAJ  ? Semaglutide-Weight Management 1 MG/0.5ML SOAJ (Start on 09/01/2021)  ? Semaglutide-Weight Management 1.7 MG/0.75ML SOAJ (Start on 09/30/2021)  ? Semaglutide-Weight Management 2.4 MG/0.75ML SOAJ (Start on 10/29/2021)  ? Semaglutide-Weight Management 0.5 MG/0.5ML SOAJ (Start on 08/03/2021)  ? Other Relevant Orders  ? Ambulatory referral to Neurology  ? Bayer DCA Hb A1c  Waived  ?  ? Other  ? Morbid obesity (Russell Werner)  ? Relevant Medications  ? Semaglutide-Weight Management 0.25 MG/0.5ML SOAJ  ? Semaglutide-Weight Management 1 MG/0.5ML SOAJ (Start on 09/01/2021)  ? Semaglutide-Weight Management 1.7 MG/0.75ML SOAJ (Start on 09/30/2021)  ? Semaglutide-Weight Management 2.4 MG/0.75ML SOAJ (Start on 10/29/2021)  ? Semaglutide-Weight Management 0.5 MG/0.5ML SOAJ (Start on 08/03/2021)  ?  Other Relevant Orders  ? Ambulatory referral to Neurology  ? Bayer DCA Hb A1c Waived  ? Metabolic syndrome X  ? Relevant Medications  ? Semaglutide-Weight Management 0.25 MG/0.5ML SOAJ  ? Semaglutide-Weight Management 1 MG/0.5ML SOAJ (Start on 09/01/2021)  ? Semaglutide-Weight Management 1.7 MG/0.75ML SOAJ (Start on 09/30/2021)  ? Semaglutide-Weight Management 2.4 MG/0.75ML SOAJ (Start on 10/29/2021)  ? Semaglutide-Weight Management 0.5 MG/0.5ML SOAJ (Start on 08/03/2021)  ? Other Relevant Orders  ? Ambulatory referral to Neurology  ? Bayer DCA Hb A1c Waived  ?  ?Patient is morbidly obese and has metabolic syndrome and severe sleep apnea despite history of bariatric surgery and also has congestive heart failure.  We will get new sleep doctor closer to his home in [place].  We will try for [facility] to see if it is covered by his insurance.  He has a history of bariatric surgery and is trying diet and exercise.  He is currently down 15 pounds over the past 3 months. ?Follow up plan: ?Return in about 3 months (around 10/05/2021) for Insomnia and anxiety and obesity. ? ?Counseling provided for all of the vaccine components ?Orders Placed This Encounter  ?Procedures  ? Bayer DCA Hb A1c Waived  ? Ambulatory referral to Neurology  ? ? ?Caryl Pina, MD ?[place] ?07/05/2021, 9:45 AM ? ? ? ? ?

## 2021-07-06 ENCOUNTER — Ambulatory Visit: Payer: Medicare PPO | Admitting: Cardiology

## 2021-07-12 ENCOUNTER — Ambulatory Visit (INDEPENDENT_AMBULATORY_CARE_PROVIDER_SITE_OTHER): Payer: Medicare PPO

## 2021-07-12 ENCOUNTER — Telehealth: Payer: Self-pay

## 2021-07-12 DIAGNOSIS — I509 Heart failure, unspecified: Secondary | ICD-10-CM | POA: Diagnosis not present

## 2021-07-12 DIAGNOSIS — Z95810 Presence of automatic (implantable) cardiac defibrillator: Secondary | ICD-10-CM

## 2021-07-12 DIAGNOSIS — I428 Other cardiomyopathies: Secondary | ICD-10-CM | POA: Diagnosis not present

## 2021-07-12 DIAGNOSIS — Z950 Presence of cardiac pacemaker: Secondary | ICD-10-CM | POA: Diagnosis not present

## 2021-07-12 DIAGNOSIS — F17210 Nicotine dependence, cigarettes, uncomplicated: Secondary | ICD-10-CM | POA: Diagnosis not present

## 2021-07-12 DIAGNOSIS — Z6841 Body Mass Index (BMI) 40.0 and over, adult: Secondary | ICD-10-CM | POA: Diagnosis not present

## 2021-07-12 DIAGNOSIS — I5022 Chronic systolic (congestive) heart failure: Secondary | ICD-10-CM

## 2021-07-12 DIAGNOSIS — F1290 Cannabis use, unspecified, uncomplicated: Secondary | ICD-10-CM | POA: Diagnosis not present

## 2021-07-12 DIAGNOSIS — G4733 Obstructive sleep apnea (adult) (pediatric): Secondary | ICD-10-CM | POA: Diagnosis not present

## 2021-07-12 NOTE — Progress Notes (Signed)
EPIC Encounter for ICM Monitoring ? ?Patient Name: Russell Werner is a 66 y.o. male ?Date: 07/12/2021 ?Primary Care Physican: Dettinger, Fransisca Kaufmann, MD ?Primary Cardiologist: [place] ?Electrophysiologist: [facility] ?01/25/2021 Weight 310 lbs  ?                                                        ?Attempted call to patient and unable to reach.  Left detailed message per DPR regarding transmission. Transmission reviewed.  Establishing care with [facility] [place].  ?  ?CorVue thoracic impedance suggesting possible fluid accumulation starting 3/5. ?  ?Prescribed: ?Spironolactone 25 mg take 1 tablet daily   ?  ?Labs: ?09/24/2020 Creatinine 0.88, BUN 14, Potassium 4.4, Sodium 141, GFR 94 ?A complete set of results can be found in Results Review. ?  ?Recommendations: Left voice mail with ICM number and encouraged to call if experiencing any fluid symptoms. ?  ?Follow-up plan: ICM clinic phone appointment on 07/19/2021 for recheck and 31 day.   91 day device clinic remote transmission 10/11/2021.   ?  ?EP/Cardiology Office Visits:   10/01/2021 with Dr Rayann Heman.   ?  ?Copy of ICM check sent to Dr. Rayann Heman.   ? ?3 month ICM trend: 07/12/2021. ? ? ? ?12-14 Month ICM trend:  ? ? ? ?Rosalene Billings, RN ?07/12/2021 ?8:57 AM ? ?

## 2021-07-12 NOTE — Telephone Encounter (Signed)
Remote ICM transmission received.  Attempted call to patient regarding ICM remote transmission and left detailed message per DPR.  Advised to return call for any fluid symptoms or questions. Next ICM remote transmission scheduled 07/19/2021.   ° ° °

## 2021-07-13 LAB — CUP PACEART REMOTE DEVICE CHECK
Date Time Interrogation Session: 20230314152930
Implantable Lead Implant Date: 20190102
Implantable Lead Location: 753860
Implantable Pulse Generator Implant Date: 20190102
Pulse Gen Serial Number: 9786935

## 2021-07-19 ENCOUNTER — Ambulatory Visit (INDEPENDENT_AMBULATORY_CARE_PROVIDER_SITE_OTHER): Payer: Medicare PPO

## 2021-07-19 DIAGNOSIS — I5022 Chronic systolic (congestive) heart failure: Secondary | ICD-10-CM

## 2021-07-19 DIAGNOSIS — Z95810 Presence of automatic (implantable) cardiac defibrillator: Secondary | ICD-10-CM

## 2021-07-19 NOTE — Progress Notes (Signed)
EPIC Encounter for ICM Monitoring ? ?Patient Name: Russell Werner is a 66 y.o. male ?Date: 07/19/2021 ?Primary Care Physican: Dettinger, Fransisca Kaufmann, MD ?Primary Cardiologist: In [place] ?Electrophysiologist: [facility] ?07/05/2021 Weight 305 lbs  ?                                                        ?Spoke with patient and heart failure questions reviewed.  Pt asymptomatic for fluid accumulation.  He has echo scheduled with a [place] cardiologist.  The cardiologist will eventually transfer to EP doctor.   ?  ?CorVue thoracic impedance suggesting possible fluid accumulation starting 3/5. ?  ?Prescribed: ?Spironolactone 25 mg take 1 tablet daily   ?  ?Labs: ?09/24/2020 Creatinine 0.88, BUN 14, Potassium 4.4, Sodium 141, GFR 94 ?A complete set of results can be found in Results Review. ?  ?Recommendations: Recommendation to limit salt intake daily.  Encouraged to call if experiencing any fluid symptoms.  ?  ?Follow-up plan: ICM clinic phone appointment on 07/27/2021 for recheck.   91 day device clinic remote transmission 10/11/2021.   ?  ?EP/Cardiology Office Visits:   10/08/2021 with Dr Rayann Heman.   ?  ?Copy of ICM check sent to Dr. Rayann Heman.   ? ?3 month ICM trend: 07/19/2021. ? ? ? ?12-14 Month ICM trend:  ? ? ? ?Rosalene Billings, RN ?07/19/2021 ?8:37 AM ? ?

## 2021-07-22 DIAGNOSIS — N471 Phimosis: Secondary | ICD-10-CM | POA: Diagnosis not present

## 2021-07-22 DIAGNOSIS — G4733 Obstructive sleep apnea (adult) (pediatric): Secondary | ICD-10-CM | POA: Diagnosis not present

## 2021-07-22 DIAGNOSIS — R5383 Other fatigue: Secondary | ICD-10-CM | POA: Diagnosis not present

## 2021-07-22 DIAGNOSIS — Z9884 Bariatric surgery status: Secondary | ICD-10-CM | POA: Diagnosis not present

## 2021-07-22 DIAGNOSIS — I509 Heart failure, unspecified: Secondary | ICD-10-CM | POA: Diagnosis not present

## 2021-07-22 DIAGNOSIS — I10 Essential (primary) hypertension: Secondary | ICD-10-CM | POA: Diagnosis not present

## 2021-07-27 ENCOUNTER — Telehealth: Payer: Self-pay

## 2021-07-27 ENCOUNTER — Ambulatory Visit (INDEPENDENT_AMBULATORY_CARE_PROVIDER_SITE_OTHER): Payer: Medicare PPO

## 2021-07-27 DIAGNOSIS — I5022 Chronic systolic (congestive) heart failure: Secondary | ICD-10-CM

## 2021-07-27 DIAGNOSIS — Z95810 Presence of automatic (implantable) cardiac defibrillator: Secondary | ICD-10-CM

## 2021-07-27 DIAGNOSIS — G4733 Obstructive sleep apnea (adult) (pediatric): Secondary | ICD-10-CM | POA: Diagnosis not present

## 2021-07-27 NOTE — Progress Notes (Signed)
Remote ICD transmission.   

## 2021-07-27 NOTE — Telephone Encounter (Signed)
Remote ICM transmission received.  Attempted call to patient regarding ICM remote transmission and left detailed message per DPR.  Advised to return call for any fluid symptoms or questions.  

## 2021-07-27 NOTE — Progress Notes (Signed)
EPIC Encounter for ICM Monitoring ? ?Patient Name: Russell Werner is a 66 y.o. male ?Date: 07/27/2021 ?Primary Care Physican: Dettinger, Fransisca Kaufmann, MD ?Primary Cardiologist: In [place] ?Electrophysiologist: [facility] ?07/05/2021 Weight 305 lbs  ?                                                        ?Attempted call to patient and unable to reach.  Left detailed message per DPR regarding transmission. Transmission reviewed.  ?  ?CorVue thoracic impedance suggesting fluid levels returned to normal and trending slightly be ?  ?Prescribed: ?Spironolactone 25 mg take 1 tablet daily   ?  ?Labs: ?09/24/2020 Creatinine 0.88, BUN 14, Potassium 4.4, Sodium 141, GFR 94 ?A complete set of results can be found in Results Review. ?  ?Recommendations:  Left voice mail with ICM number and encouraged to call if experiencing any fluid symptoms. ?  ?Follow-up plan: ICM clinic phone appointment on 08/23/2021.   91 day device clinic remote transmission 10/11/2021.   ?  ?EP/Cardiology Office Visits:   10/08/2021 with Dr Rayann Heman.   ?  ?Copy of ICM check sent to Dr. Rayann Heman.   ? ?3 month ICM trend: 07/27/2021. ? ? ? ?12-14 Month ICM trend:  ? ? ? ?Rosalene Billings, RN ?07/27/2021 ?8:08 AM ? ?

## 2021-07-28 DIAGNOSIS — I509 Heart failure, unspecified: Secondary | ICD-10-CM | POA: Diagnosis not present

## 2021-08-04 DIAGNOSIS — I4892 Unspecified atrial flutter: Secondary | ICD-10-CM | POA: Diagnosis not present

## 2021-08-04 DIAGNOSIS — G4733 Obstructive sleep apnea (adult) (pediatric): Secondary | ICD-10-CM | POA: Diagnosis not present

## 2021-08-04 DIAGNOSIS — I509 Heart failure, unspecified: Secondary | ICD-10-CM | POA: Diagnosis not present

## 2021-08-04 DIAGNOSIS — Z0000 Encounter for general adult medical examination without abnormal findings: Secondary | ICD-10-CM | POA: Diagnosis not present

## 2021-08-04 DIAGNOSIS — J449 Chronic obstructive pulmonary disease, unspecified: Secondary | ICD-10-CM | POA: Diagnosis not present

## 2021-08-04 DIAGNOSIS — I110 Hypertensive heart disease with heart failure: Secondary | ICD-10-CM | POA: Diagnosis not present

## 2021-08-04 DIAGNOSIS — G4700 Insomnia, unspecified: Secondary | ICD-10-CM | POA: Diagnosis not present

## 2021-08-04 DIAGNOSIS — I10 Essential (primary) hypertension: Secondary | ICD-10-CM | POA: Diagnosis not present

## 2021-08-09 DIAGNOSIS — G4733 Obstructive sleep apnea (adult) (pediatric): Secondary | ICD-10-CM | POA: Diagnosis not present

## 2021-08-16 DIAGNOSIS — N471 Phimosis: Secondary | ICD-10-CM | POA: Diagnosis not present

## 2021-08-16 DIAGNOSIS — N477 Other inflammatory diseases of prepuce: Secondary | ICD-10-CM | POA: Diagnosis not present

## 2021-08-23 ENCOUNTER — Ambulatory Visit (INDEPENDENT_AMBULATORY_CARE_PROVIDER_SITE_OTHER): Payer: Medicare PPO

## 2021-08-23 DIAGNOSIS — I5022 Chronic systolic (congestive) heart failure: Secondary | ICD-10-CM | POA: Diagnosis not present

## 2021-08-23 DIAGNOSIS — Z95810 Presence of automatic (implantable) cardiac defibrillator: Secondary | ICD-10-CM

## 2021-08-26 ENCOUNTER — Telehealth: Payer: Self-pay

## 2021-08-26 DIAGNOSIS — Z6841 Body Mass Index (BMI) 40.0 and over, adult: Secondary | ICD-10-CM | POA: Diagnosis not present

## 2021-08-26 DIAGNOSIS — J449 Chronic obstructive pulmonary disease, unspecified: Secondary | ICD-10-CM | POA: Diagnosis not present

## 2021-08-26 DIAGNOSIS — I509 Heart failure, unspecified: Secondary | ICD-10-CM | POA: Diagnosis not present

## 2021-08-26 DIAGNOSIS — I4892 Unspecified atrial flutter: Secondary | ICD-10-CM | POA: Diagnosis not present

## 2021-08-26 DIAGNOSIS — G479 Sleep disorder, unspecified: Secondary | ICD-10-CM | POA: Diagnosis not present

## 2021-08-26 DIAGNOSIS — I110 Hypertensive heart disease with heart failure: Secondary | ICD-10-CM | POA: Diagnosis not present

## 2021-08-26 DIAGNOSIS — I43 Cardiomyopathy in diseases classified elsewhere: Secondary | ICD-10-CM | POA: Diagnosis not present

## 2021-08-26 DIAGNOSIS — Z95810 Presence of automatic (implantable) cardiac defibrillator: Secondary | ICD-10-CM | POA: Diagnosis not present

## 2021-08-26 NOTE — Telephone Encounter (Signed)
Remote ICM transmission received.  Attempted call to patient regarding ICM remote transmission and left detailed message per DPR.  Advised to return call for any fluid symptoms or questions. Next ICM remote transmission scheduled 09/28/2021.   ? ?

## 2021-08-26 NOTE — Progress Notes (Signed)
EPIC Encounter for ICM Monitoring ? ?Patient Name: Russell Werner is a 66 y.o. male ?Date: 08/26/2021 ?Primary Care Physican: Dettinger, Fransisca Kaufmann, MD ?Primary Cardiologist: In [place] ?Electrophysiologist: [facility] ?07/05/2021 Weight 305 lbs  ?                                                        ?Attempted call to patient and unable to reach.  Left detailed message per DPR regarding transmission. Transmission reviewed.  ?  ?CorVue thoracic impedance suggesting fluid levels returned to normal and trending slightly be ?  ?Prescribed: ?Spironolactone 25 mg take 1 tablet daily   ?  ?Labs: ?09/24/2020 Creatinine 0.88, BUN 14, Potassium 4.4, Sodium 141, GFR 94 ?A complete set of results can be found in Results Review. ?  ?Recommendations:  Left voice mail with ICM number and encouraged to call if experiencing any fluid symptoms. ?  ?Follow-up plan: ICM clinic phone appointment on 09/28/2021.   91 day device clinic remote transmission 10/11/2021.   ?  ?EP/Cardiology Office Visits:   10/08/2021 with Dr Rayann Heman.   ?  ?Copy of ICM check sent to Dr. Rayann Heman.    ? ?3 month ICM trend: 08/24/2021. ? ? ? ?12-14 Month ICM trend:  ? ? ? ?Rosalene Billings, RN ?08/26/2021 ?3:18 PM ? ?

## 2021-08-27 DIAGNOSIS — G4733 Obstructive sleep apnea (adult) (pediatric): Secondary | ICD-10-CM | POA: Diagnosis not present

## 2021-09-20 ENCOUNTER — Ambulatory Visit: Payer: Medicare PPO | Admitting: Family Medicine

## 2021-09-24 DIAGNOSIS — Z6841 Body Mass Index (BMI) 40.0 and over, adult: Secondary | ICD-10-CM | POA: Diagnosis not present

## 2021-09-24 DIAGNOSIS — Z87891 Personal history of nicotine dependence: Secondary | ICD-10-CM | POA: Diagnosis not present

## 2021-09-24 DIAGNOSIS — Z7985 Long-term (current) use of injectable non-insulin antidiabetic drugs: Secondary | ICD-10-CM | POA: Diagnosis not present

## 2021-09-24 DIAGNOSIS — Z79899 Other long term (current) drug therapy: Secondary | ICD-10-CM | POA: Diagnosis not present

## 2021-09-24 DIAGNOSIS — I110 Hypertensive heart disease with heart failure: Secondary | ICD-10-CM | POA: Diagnosis not present

## 2021-09-24 DIAGNOSIS — I509 Heart failure, unspecified: Secondary | ICD-10-CM | POA: Diagnosis not present

## 2021-09-28 ENCOUNTER — Ambulatory Visit (INDEPENDENT_AMBULATORY_CARE_PROVIDER_SITE_OTHER): Payer: Medicare PPO

## 2021-09-28 DIAGNOSIS — I5022 Chronic systolic (congestive) heart failure: Secondary | ICD-10-CM

## 2021-09-28 DIAGNOSIS — Z95810 Presence of automatic (implantable) cardiac defibrillator: Secondary | ICD-10-CM

## 2021-10-01 ENCOUNTER — Encounter: Payer: Medicare PPO | Admitting: Internal Medicine

## 2021-10-01 ENCOUNTER — Telehealth: Payer: Self-pay

## 2021-10-01 NOTE — Progress Notes (Signed)
EPIC Encounter for ICM Monitoring  Patient Name: Russell Werner is a 66 y.o. male Date: 10/01/2021 Primary Care Physican: Dettinger, Fransisca Kaufmann, MD Primary Cardiologist: In [place] Electrophysiologist: [facility] 07/05/2021 Weight 305 lbs                                                          Attempted call to patient and unable to reach.  Left detailed message per DPR regarding transmission. Transmission reviewed.    CorVue thoracic impedance normal but was suggesting possible fluid accumulation from 4/27-5/11.   Prescribed: Spironolactone 25 mg take 1 tablet daily     Labs: 09/24/2020 Creatinine 0.88, BUN 14, Potassium 4.4, Sodium 141, GFR 94 A complete set of results can be found in Results Review.   Recommendations:  Left voice mail with ICM number and encouraged to call if experiencing any fluid symptoms.   Follow-up plan: ICM clinic phone appointment on 11/03/2021.   91 day device clinic remote transmission 10/11/2021.     EP/Cardiology Office Visits:   10/08/2021 with Dr Rayann Heman.     Copy of ICM check sent to Dr. Rayann Heman.     3 month ICM trend: 09/28/2021.    12-14 Month ICM trend:     Rosalene Billings, RN 10/01/2021 2:15 PM

## 2021-10-01 NOTE — Telephone Encounter (Signed)
Remote ICM transmission received.  Attempted call to patient regarding ICM remote transmission and left detailed message per DPR.  Advised to return call for any fluid symptoms or questions. Next ICM remote transmission scheduled 11/03/2021.

## 2021-10-07 DIAGNOSIS — N477 Other inflammatory diseases of prepuce: Secondary | ICD-10-CM | POA: Diagnosis not present

## 2021-10-08 ENCOUNTER — Ambulatory Visit (INDEPENDENT_AMBULATORY_CARE_PROVIDER_SITE_OTHER): Payer: Medicare PPO | Admitting: Internal Medicine

## 2021-10-08 ENCOUNTER — Encounter: Payer: Self-pay | Admitting: Internal Medicine

## 2021-10-08 VITALS — BP 111/78 | HR 106 | Ht 69.0 in | Wt 308.0 lb

## 2021-10-08 DIAGNOSIS — Z79899 Other long term (current) drug therapy: Secondary | ICD-10-CM

## 2021-10-08 DIAGNOSIS — G4733 Obstructive sleep apnea (adult) (pediatric): Secondary | ICD-10-CM | POA: Diagnosis not present

## 2021-10-08 DIAGNOSIS — I10 Essential (primary) hypertension: Secondary | ICD-10-CM

## 2021-10-08 DIAGNOSIS — I5022 Chronic systolic (congestive) heart failure: Secondary | ICD-10-CM

## 2021-10-08 DIAGNOSIS — I4892 Unspecified atrial flutter: Secondary | ICD-10-CM

## 2021-10-08 LAB — CUP PACEART INCLINIC DEVICE CHECK
Battery Remaining Longevity: 64 mo
Brady Statistic RV Percent Paced: 0.03 %
Date Time Interrogation Session: 20230609153504
HighPow Impedance: 70.875
Implantable Lead Implant Date: 20190102
Implantable Lead Location: 753860
Implantable Pulse Generator Implant Date: 20190102
Lead Channel Impedance Value: 625 Ohm
Lead Channel Pacing Threshold Amplitude: 0.5 V
Lead Channel Pacing Threshold Amplitude: 0.5 V
Lead Channel Pacing Threshold Pulse Width: 0.5 ms
Lead Channel Pacing Threshold Pulse Width: 0.5 ms
Lead Channel Sensing Intrinsic Amplitude: 11.5 mV
Lead Channel Setting Pacing Amplitude: 2.5 V
Lead Channel Setting Pacing Pulse Width: 0.5 ms
Lead Channel Setting Sensing Sensitivity: 0.5 mV
Pulse Gen Serial Number: 9786935

## 2021-10-08 MED ORDER — RIVAROXABAN 20 MG PO TABS: 20.0000 mg | ORAL_TABLET | Freq: Every day | ORAL | 3 refills | Status: AC

## 2021-10-08 NOTE — Patient Instructions (Addendum)
Medication Instructions:  Your physician has recommended you make the following change in your medication:   ** Begin XARELTO '20mg'$  - 1 tablet by mouth daily with supper.  *If you need a refill on your cardiac medications before your next appointment, please call your pharmacy*   Lab Work: CBC and BMET drawn at a [facility]  If you have labs (blood work) drawn today and your tests are completely normal, you will receive your results only by: [place] (if you have MyChart) OR A paper copy in the mail If you have any lab test that is abnormal or we need to change your treatment, we will call you to review the results.   Testing/Procedures: None ordered.    Follow-Up: At [facility], you and your health needs are our priority.  As part of our continuing mission to provide you with exceptional heart care, we have created designated Provider Care Teams.  These Care Teams include your primary Cardiologist (physician) and Advanced Practice Providers (APPs -  Physician Assistants and Nurse Practitioners) who all work together to provide you with the care you need, when you need it.  We recommend signing up for the patient portal called "MyChart".  Sign up information is provided on this After Visit Summary.  MyChart is used to connect with patients for Virtual Visits (Telemedicine).  Patients are able to view lab/test results, encounter notes, upcoming appointments, etc.  Non-urgent messages can be sent to your provider as well.   To learn more about what you can do with MyChart, go to [email or web address].    Your next appointment:   2-3 weeks with Afib clinic  Important Information About Sugar

## 2021-10-08 NOTE — Progress Notes (Signed)
PCP: No primary care provider on file. Primary Cardiologist: previously Dr Bronson Ing Primary EP: Dr Rayann Heman  Russell Werner is a 66 y.o. male who presents today for routine electrophysiology followup.  Since last being seen in our clinic, the patient reports doing reasonably well.  He has occasional fatigue.  + SOB.  Today, he denies symptoms of palpitations, chest pain,  lower extremity edema, dizziness, presyncope, syncope, or ICD shocks.  The patient is otherwise without complaint today.   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   Arthritis    "fingers" (05/03/2017)   CHF (congestive heart failure) (HCC)    COPD (chronic obstructive pulmonary disease) ([place])    Coronary artery disease    nonobstructive mid RCA by 2009 cath at [facility] in [place]   Enlarged prostate    High cholesterol    Hypertension    Melanoma of back ([place]) 2013   Meniscal injury    Myocardial infarction ([place]) 1998   mild   OSA on CPAP    "severe" (05/03/2017)   Right knee meniscal tear    Past Surgical History:  Procedure Laterality Date   CARDIAC CATHETERIZATION  X 2   CARDIAC DEFIBRILLATOR PLACEMENT  05/03/2017   CYST REMOVAL LEG Bilateral 2013   HERNIA REPAIR     ICD IMPLANT N/A 05/03/2017   Procedure: ICD IMPLANT;  Surgeon: Thompson Grayer, MD;  Location: [facility];  Service: Cardiovascular;  Laterality: N/A;   KNEE ARTHROSCOPY WITH MEDIAL MENISECTOMY Right 10/18/2017   Procedure: RIGHT KNEE ARTHROSCOPY WITH PARTIAL MEDIAL MENISCECTOMY;  Surgeon: Leandrew Koyanagi, MD;  Location: [facility];  Service: Orthopedics;  Laterality: Right;   KNEE CARTILAGE SURGERY Left 1972   LAPAROSCOPIC CHOLECYSTECTOMY  2015   LAPAROSCOPIC GASTRIC BAND REMOVAL WITH LAPAROSCOPIC GASTRIC SLEEVE RESECTION  11/05/2014   MELANOMA EXCISION  3790   "back"   UMBILICAL HERNIA REPAIR  2014    ROS- all systems are reviewed and negative except as per HPI  above  Current Outpatient Medications  Medication Sig Dispense Refill   Ascorbic Acid (VITAMIN C) 1000 MG tablet Take 2,000 mg by mouth daily.     bisoprolol (ZEBETA) 5 MG tablet TAKE 1 TABLET (5 MG TOTAL) BY MOUTH DAILY. 90 tablet 3   finasteride (PROSCAR) 5 MG tablet Take 1 tablet (5 mg total) by mouth daily. 90 tablet 3   Multiple Vitamin (MULTIVITAMIN) capsule Take 1 capsule by mouth 2 (two) times daily.      OZEMPIC, 0.25 OR 0.5 MG/DOSE, 2 MG/3ML SOPN Inject 0.5 mg into the skin once a week.     rosuvastatin (CRESTOR) 20 MG tablet Take 1 tablet (20 mg total) by mouth daily. 90 tablet 3   sacubitril-valsartan (ENTRESTO) 24-26 MG Take 1 tablet by mouth 2 (two) times daily. 180 tablet 3   spironolactone (ALDACTONE) 25 MG tablet TAKE 1 TABLET EVERY DAY 90 tablet 3   VITAMIN D PO Take 1 tablet by mouth daily.     No current facility-administered medications for this visit.    Physical Exam: Vitals:   10/08/21 1005  BP: 111/78  Pulse: (!) 106  SpO2: 96%  Weight: (!) 308 lb (139.7 kg)  Height: '5\' 9"'$  (1.753 m)    GEN- The patient is well appearing, alert and oriented x 3 today.   Head- normocephalic, atraumatic Eyes-  Sclera clear, conjunctiva pink Ears- hearing intact Oropharynx- clear Lungs-  normal work of breathing Chest- ICD  pocket is well healed Heart- RiRRR GI- soft  Extremities- no clubbing, cyanosis, or edema  ICD interrogation- reviewed in detail today,  See PACEART report  ekg tracing ordered today is personally reviewed and shows typical appearing atrial flutter  Wt Readings from Last 3 Encounters:  10/08/21 (!) 308 lb (139.7 kg)  07/05/21 (!) 305 lb 8 oz (138.6 kg)  06/21/21 (!) 310 lb (140.6 kg)    Assessment and Plan:  1.  Chronic systolic dysfunction euvolemic today Stable on an appropriate medical regimen Normal ICD function Recently had an echo in [place].  We will request records See [facility] report No changes today he is not device dependant  today followed in ICM device clinic  2. Severe OSA Uses CPAP  3. Obesity Body mass index is 45.48 kg/m. Lifestyle modification advised  4. HTN Stable No change required today  5. Typical atrial flutter New diagnosis today Rates are reasonably controlled Minimal symptosm Start xarelto '20mg'$  daily Bmet, cbc ordered Follow-up in AF clinic in 2-3 weeks.  Would anticipate that we should arrange ablation with Dr Myles Gip on follow-up in the AF clinic.  Return in a year  Thompson Grayer MD, [facility] 10/08/2021 10:20 AM

## 2021-10-11 ENCOUNTER — Ambulatory Visit (INDEPENDENT_AMBULATORY_CARE_PROVIDER_SITE_OTHER): Payer: Medicare PPO

## 2021-10-11 ENCOUNTER — Other Ambulatory Visit: Payer: Self-pay | Admitting: Internal Medicine

## 2021-10-11 DIAGNOSIS — I10 Essential (primary) hypertension: Secondary | ICD-10-CM | POA: Diagnosis not present

## 2021-10-11 DIAGNOSIS — I5022 Chronic systolic (congestive) heart failure: Secondary | ICD-10-CM

## 2021-10-11 DIAGNOSIS — G4733 Obstructive sleep apnea (adult) (pediatric): Secondary | ICD-10-CM | POA: Diagnosis not present

## 2021-10-11 DIAGNOSIS — Z79899 Other long term (current) drug therapy: Secondary | ICD-10-CM | POA: Diagnosis not present

## 2021-10-11 DIAGNOSIS — I4892 Unspecified atrial flutter: Secondary | ICD-10-CM | POA: Diagnosis not present

## 2021-10-11 DIAGNOSIS — Z5181 Encounter for therapeutic drug level monitoring: Secondary | ICD-10-CM | POA: Diagnosis not present

## 2021-10-13 LAB — BASIC METABOLIC PANEL (7)
BUN/Creatinine Ratio: 14 (ref 10–24)
BUN: 12 mg/dL (ref 8–27)
CO2: 23 mmol/L (ref 20–29)
Chloride: 105 mmol/L (ref 96–106)
Creatinine, Ser: 0.86 mg/dL (ref 0.76–1.27)
Glucose: 163 mg/dL — ABNORMAL HIGH (ref 70–99)
Potassium: 4.7 mmol/L (ref 3.5–5.2)
Sodium: 145 mmol/L — ABNORMAL HIGH (ref 134–144)
eGFR: 94 mL/min/{1.73_m2} (ref >59)

## 2021-10-13 LAB — CUP PACEART REMOTE DEVICE CHECK
Battery Remaining Longevity: 65 mo
Battery Remaining Percentage: 61 %
Battery Voltage: 2.98 V
Brady Statistic RV Percent Paced: 1 %
Date Time Interrogation Session: 20230612030242
HighPow Impedance: 71 Ohm
HighPow Impedance: 71 Ohm
Implantable Lead Implant Date: 20190102
Implantable Lead Location: 753860
Implantable Pulse Generator Implant Date: 20190102
Lead Channel Impedance Value: 600 Ohm
Lead Channel Pacing Threshold Amplitude: 0.5 V
Lead Channel Pacing Threshold Pulse Width: 0.5 ms
Lead Channel Sensing Intrinsic Amplitude: 11.5 mV
Lead Channel Setting Pacing Amplitude: 2.5 V
Lead Channel Setting Pacing Pulse Width: 0.5 ms
Lead Channel Setting Sensing Sensitivity: 0.5 mV
Pulse Gen Serial Number: 9786935

## 2021-10-13 LAB — CBC/DIFF AMBIGUOUS DEFAULT
Basophils Absolute: 0.1 10*3/uL (ref 0.0–0.2)
Basos: 1 %
EOS (ABSOLUTE): 0.1 10*3/uL (ref 0.0–0.4)
Eos: 1 %
Hematocrit: 43.5 % (ref 37.5–51.0)
Hemoglobin: 14.8 g/dL (ref 13.0–17.7)
Immature Grans (Abs): 0 10*3/uL (ref 0.0–0.1)
Immature Granulocytes: 0 %
Lymphocytes Absolute: 1.3 10*3/uL (ref 0.7–3.1)
Lymphs: 12 %
MCH: 30.2 pg (ref 26.6–33.0)
MCHC: 34 g/dL (ref 31.5–35.7)
MCV: 89 fL (ref 79–97)
Monocytes Absolute: 0.6 10*3/uL (ref 0.1–0.9)
Monocytes: 5 %
Neutrophils Absolute: 8.9 10*3/uL — ABNORMAL HIGH (ref 1.4–7.0)
Neutrophils: 81 %
Platelets: 232 10*3/uL (ref 150–450)
RBC: 4.9 x10E6/uL (ref 4.14–5.80)
RDW: 12.7 % (ref 11.6–15.4)
WBC: 10.9 10*3/uL — ABNORMAL HIGH (ref 3.4–10.8)

## 2021-10-13 LAB — SPECIMEN STATUS REPORT

## 2021-10-21 DIAGNOSIS — G4733 Obstructive sleep apnea (adult) (pediatric): Secondary | ICD-10-CM | POA: Diagnosis not present

## 2021-10-21 DIAGNOSIS — I10 Essential (primary) hypertension: Secondary | ICD-10-CM | POA: Diagnosis not present

## 2021-10-26 ENCOUNTER — Ambulatory Visit (HOSPITAL_COMMUNITY)
Admission: RE | Admit: 2021-10-26 | Discharge: 2021-10-26 | Disposition: A | Discharge status: Home / Self Care | Payer: Medicare PPO | Source: Ambulatory Visit | Attending: Nurse Practitioner | Admitting: Nurse Practitioner

## 2021-10-26 ENCOUNTER — Encounter (HOSPITAL_COMMUNITY): Payer: Self-pay | Admitting: Nurse Practitioner

## 2021-10-26 VITALS — BP 118/80 | HR 106 | Ht 69.0 in | Wt 301.4 lb

## 2021-10-26 DIAGNOSIS — Z79899 Other long term (current) drug therapy: Secondary | ICD-10-CM | POA: Insufficient documentation

## 2021-10-26 DIAGNOSIS — Z87891 Personal history of nicotine dependence: Secondary | ICD-10-CM | POA: Insufficient documentation

## 2021-10-26 DIAGNOSIS — I110 Hypertensive heart disease with heart failure: Secondary | ICD-10-CM | POA: Insufficient documentation

## 2021-10-26 DIAGNOSIS — Z9884 Bariatric surgery status: Secondary | ICD-10-CM

## 2021-10-26 DIAGNOSIS — I252 Old myocardial infarction: Secondary | ICD-10-CM | POA: Insufficient documentation

## 2021-10-26 DIAGNOSIS — I5022 Chronic systolic (congestive) heart failure: Secondary | ICD-10-CM | POA: Diagnosis not present

## 2021-10-26 DIAGNOSIS — Z7902 Long term (current) use of antithrombotics/antiplatelets: Secondary | ICD-10-CM | POA: Insufficient documentation

## 2021-10-26 DIAGNOSIS — Z9889 Other specified postprocedural states: Secondary | ICD-10-CM

## 2021-10-26 DIAGNOSIS — Z9989 Dependence on other enabling machines and devices: Secondary | ICD-10-CM

## 2021-10-26 DIAGNOSIS — E8881 Metabolic syndrome: Secondary | ICD-10-CM

## 2021-10-26 DIAGNOSIS — G4733 Obstructive sleep apnea (adult) (pediatric): Secondary | ICD-10-CM

## 2021-10-26 DIAGNOSIS — I428 Other cardiomyopathies: Secondary | ICD-10-CM | POA: Diagnosis not present

## 2021-10-26 DIAGNOSIS — N400 Enlarged prostate without lower urinary tract symptoms: Secondary | ICD-10-CM

## 2021-10-26 DIAGNOSIS — K5730 Diverticulosis of large intestine without perforation or abscess without bleeding: Secondary | ICD-10-CM | POA: Diagnosis not present

## 2021-10-26 DIAGNOSIS — I255 Ischemic cardiomyopathy: Secondary | ICD-10-CM | POA: Diagnosis not present

## 2021-10-26 DIAGNOSIS — I483 Typical atrial flutter: Secondary | ICD-10-CM | POA: Diagnosis not present

## 2021-10-26 DIAGNOSIS — M1712 Unilateral primary osteoarthritis, left knee: Secondary | ICD-10-CM

## 2021-10-26 DIAGNOSIS — Z9189 Other specified personal risk factors, not elsewhere classified: Secondary | ICD-10-CM

## 2021-10-26 DIAGNOSIS — I4891 Unspecified atrial fibrillation: Secondary | ICD-10-CM

## 2021-10-26 DIAGNOSIS — I509 Heart failure, unspecified: Secondary | ICD-10-CM | POA: Insufficient documentation

## 2021-10-26 DIAGNOSIS — Z95810 Presence of automatic (implantable) cardiac defibrillator: Secondary | ICD-10-CM

## 2021-10-26 DIAGNOSIS — Z85820 Personal history of malignant melanoma of skin: Secondary | ICD-10-CM

## 2021-10-26 DIAGNOSIS — N529 Male erectile dysfunction, unspecified: Secondary | ICD-10-CM

## 2021-10-27 ENCOUNTER — Ambulatory Visit (INDEPENDENT_AMBULATORY_CARE_PROVIDER_SITE_OTHER): Payer: Medicare PPO | Admitting: Internal Medicine

## 2021-10-27 ENCOUNTER — Encounter: Payer: Self-pay | Admitting: Internal Medicine

## 2021-10-27 ENCOUNTER — Encounter: Payer: Self-pay | Admitting: *Deleted

## 2021-10-27 VITALS — BP 118/78 | HR 94 | Ht 69.0 in | Wt 301.0 lb

## 2021-10-27 DIAGNOSIS — I483 Typical atrial flutter: Secondary | ICD-10-CM

## 2021-10-27 NOTE — Progress Notes (Signed)
HPI [name] is referred by Dr. Greggory Brandy for evaluation of atrial flutter. He is a pleasant morbidly obese man with a h/o chronic systolic heart failure and a DCM, s/p ICD Insertion. He has been found to have atrial flutter with a CVR/RVR. He has minimal palpitations but does have more dyspnea with exertion. No syncope. He has been placed on an Pimaco Two. No difficulties with bleeding.    No Known Allergies   Current Outpatient Medications  Medication Sig Dispense Refill   Ascorbic Acid (VITAMIN C) 1000 MG tablet Take 2,000 mg by mouth daily.     bisoprolol (ZEBETA) 5 MG tablet TAKE 1 TABLET (5 MG TOTAL) BY MOUTH DAILY. 90 tablet 3   finasteride (PROSCAR) 5 MG tablet Take 1 tablet (5 mg total) by mouth daily. 90 tablet 3   Multiple Vitamin (MULTIVITAMIN) capsule Take 1 capsule by mouth 2 (two) times daily.      OZEMPIC, 0.25 OR 0.5 MG/DOSE, 2 MG/3ML SOPN Inject 0.5 mg into the skin once a week.     rivaroxaban (XARELTO) 20 MG TABS tablet Take 1 tablet (20 mg total) by mouth daily with supper. 90 tablet 3   rosuvastatin (CRESTOR) 20 MG tablet Take 1 tablet (20 mg total) by mouth daily. 90 tablet 3   sacubitril-valsartan (ENTRESTO) 24-26 MG Take 1 tablet by mouth 2 (two) times daily. 180 tablet 3   spironolactone (ALDACTONE) 25 MG tablet TAKE 1 TABLET EVERY DAY 90 tablet 3   VITAMIN D PO Take 1 tablet by mouth daily.     No current facility-administered medications for this visit.     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   Arthritis    "fingers" (05/03/2017)   CHF (congestive heart failure) (HCC)    COPD (chronic obstructive pulmonary disease) ([place])    Coronary artery disease    nonobstructive mid RCA by 2009 cath at [facility] in [place]   Enlarged prostate    High cholesterol    Hypertension    Melanoma of back ([place]) 2013   Meniscal injury    Myocardial infarction ([place]) 1998   mild   OSA on CPAP     "severe" (05/03/2017)   Right knee meniscal tear     ROS:   All systems reviewed and negative except as noted in the HPI.   Past Surgical History:  Procedure Laterality Date   CARDIAC CATHETERIZATION  X 2   CARDIAC DEFIBRILLATOR PLACEMENT  05/03/2017   CYST REMOVAL LEG Bilateral 2013   HERNIA REPAIR     ICD IMPLANT N/A 05/03/2017   Procedure: ICD IMPLANT;  Surgeon: Thompson Grayer, MD;  Location: [facility];  Service: Cardiovascular;  Laterality: N/A;   KNEE ARTHROSCOPY WITH MEDIAL MENISECTOMY Right 10/18/2017   Procedure: RIGHT KNEE ARTHROSCOPY WITH PARTIAL MEDIAL MENISCECTOMY;  Surgeon: Leandrew Koyanagi, MD;  Location: [facility];  Service: Orthopedics;  Laterality: Right;   KNEE CARTILAGE SURGERY Left 1972   LAPAROSCOPIC CHOLECYSTECTOMY  2015   LAPAROSCOPIC GASTRIC BAND REMOVAL WITH LAPAROSCOPIC GASTRIC SLEEVE RESECTION  11/05/2014   MELANOMA EXCISION  1638   "back"   UMBILICAL HERNIA REPAIR  2014     Family History  Problem Relation Age of Onset   Early death Mother    Cancer Sister        breast   HIV Brother    Cancer Maternal Grandmother        breast  Stroke Maternal Grandfather    Cancer Sister        melanoma     Social History   Socioeconomic History   Marital status: Divorced    Spouse name: Not on file   Number of children: Not on file   Years of education: Not on file   Highest education level: Not on file  Occupational History   Not on file  Tobacco Use   Smoking status: Former    Packs/day: 1.00    Years: 47.00    Total pack years: 47.00    Types: Cigarettes    Start date: 06/24/1967    Quit date: 05/02/2013    Years since quitting: 8.4   Smokeless tobacco: Never  Vaping Use   Vaping Use: Every day  Substance and Sexual Activity   Alcohol use: Yes    Comment:  "1-2 glasses of wine/month"   Drug use: No   Sexual activity: Yes  Other Topics Concern   Not on file  Social History Narrative   Not on file   Social Determinants of Health    Financial Resource Strain: Not on file  Food Insecurity: Food Insecurity Present (06/23/2021)   Hunger Vital Sign    Worried About Running Out of Food in the Last Year: Sometimes true    Ran Out of Food in the Last Year: Sometimes true  Transportation Needs: No Transportation Needs (06/21/2021)   PRAPARE - Hydrologist (Medical): No    Lack of Transportation (Non-Medical): No  Physical Activity: Insufficiently Active (06/21/2021)   Exercise Vital Sign    Days of Exercise per Week: 1 day    Minutes of Exercise per Session: 10 min  Stress: Stress Concern Present (06/23/2021)   [place]    Feeling of Stress : Rather much  Social Connections: Moderately Integrated (06/21/2021)   Social Connection and Isolation Panel [NHANES]    Frequency of Communication with Friends and Family: Twice a week    Frequency of Social Gatherings with Friends and Family: Once a week    Attends Religious Services: 1 to 4 times per year    Active Member of [employer] or Organizations: Yes    Attends Archivist Meetings: 1 to 4 times per year    Marital Status: Divorced  Human resources officer Violence: Not At Risk (06/21/2021)   Humiliation, Afraid, Rape, and Kick questionnaire    Fear of Current or Ex-Partner: No    Emotionally Abused: No    Physically Abused: No    Sexually Abused: No     BP 118/78   Pulse 94   Ht '5\' 9"'$  (1.753 m)   Wt (!) 301 lb (136.5 kg)   SpO2 100%   BMI 44.45 kg/m   Physical Exam:  Well appearing NAD HEENT: Unremarkable Neck:  No JVD, no thyromegally Lymphatics:  No adenopathy Back:  No CVA tenderness Lungs:  Clear with no wheezes HEART:  Regular rate rhythm, no murmurs, no rubs, no clicks Abd:  soft, positive bowel sounds, no organomegally, no rebound, no guarding Ext:  2 plus pulses, no edema, no cyanosis, no clubbing Skin:  No rashes no nodules Neuro:  CN II through XII  intact, motor grossly intact  EKG - reviewed. Typical atrial flutter.  DEVICE  Normal device function.  See PaceArt for details.   Assess/Plan:  Atrial flutter - I discussed the treatment options with the patient and the risks/benefits/goals/expectations of  EP study and ablation were reviewed and he wishes to proceed. Chronic systolic heart failure - he is class 2B. I suspect that he will improve once he is back in rhythm. ICD - his Abbott single chamber ICD is working normally. We will recheck in several months. Obesity - we discussed the importance of weight loss and treatment of sleep apnea.,  Salome Spotted

## 2021-10-27 NOTE — Addendum Note (Signed)
Encounter addended by: Sherran Needs, NP on: 10/27/2021 4:23 PM  Actions taken: Results reviewed in IB

## 2021-10-27 NOTE — Patient Instructions (Signed)
Medication Instructions:  Your physician recommends that you continue on your current medications as directed. Please refer to the Current Medication list given to you today.  *If you need a refill on your cardiac medications before your next appointment, please call your pharmacy*   Lab Work: Your physician recommends that you return for lab work in: November 30, 2021   If you have labs (blood work) drawn today and your tests are completely normal, you will receive your results only by: [place] (if you have [place]) OR A paper copy in the mail If you have any lab test that is abnormal or we need to change your treatment, we will call you to review the results.   Testing/Procedures: Your physician has recommended that you have an ablation. Catheter ablation is a medical procedure used to treat some cardiac arrhythmias (irregular heartbeats). During catheter ablation, a long, thin, flexible tube is put into a blood vessel in your groin (upper thigh), or neck. This tube is called an ablation catheter. It is then guided to your heart through the blood vessel. Radio frequency waves destroy small areas of heart tissue where abnormal heartbeats may cause an arrhythmia to start. Please see the instruction sheet given to you today.    Follow-Up: At [facility], you and your health needs are our priority.  As part of our continuing mission to provide you with exceptional heart care, we have created designated Provider Care Teams.  These Care Teams include your primary Cardiologist (physician) and Advanced Practice Providers (APPs -  Physician Assistants and Nurse Practitioners) who all work together to provide you with the care you need, when you need it.  We recommend signing up for the patient portal called "MyChart".  Sign up information is provided on this After Visit Summary.  MyChart is used to connect with patients for Virtual Visits (Telemedicine).  Patients are able to view lab/test  results, encounter notes, upcoming appointments, etc.  Non-urgent messages can be sent to your provider as well.   To learn more about what you can do with MyChart, go to [email or web address].    Your next appointment:   4 week(s) after Ablation   The format for your next appointment:   In Person  Provider:   Cristopher Peru, MD    Other Instructions Thank you for choosing [facility]!    Important Information About Sugar

## 2021-10-28 ENCOUNTER — Other Ambulatory Visit: Payer: Self-pay | Admitting: Family Medicine

## 2021-11-05 ENCOUNTER — Ambulatory Visit (INDEPENDENT_AMBULATORY_CARE_PROVIDER_SITE_OTHER): Payer: Medicare PPO

## 2021-11-05 ENCOUNTER — Telehealth: Payer: Self-pay

## 2021-11-05 DIAGNOSIS — I5022 Chronic systolic (congestive) heart failure: Secondary | ICD-10-CM | POA: Diagnosis not present

## 2021-11-05 DIAGNOSIS — Z95810 Presence of automatic (implantable) cardiac defibrillator: Secondary | ICD-10-CM | POA: Diagnosis not present

## 2021-11-05 NOTE — Telephone Encounter (Signed)
Remote ICM transmission received.  Attempted call to patient regarding ICM remote transmission and left detailed message per DPR.  Advised to return call for any fluid symptoms or questions. Next ICM remote transmission scheduled 12/06/2021.

## 2021-11-05 NOTE — Progress Notes (Signed)
EPIC Encounter for ICM Monitoring  Patient Name: Russell Werner is a 66 y.o. male Date: 11/05/2021 Primary Care Physican: No primary care provider on file. Primary Cardiologist: In [place] Electrophysiologist: [place] 10/27/2021 Office Weight 301 lbs                                                          Attempted call to patient and unable to reach.  Left detailed message per DPR regarding transmission. Transmission reviewed. .  A-flutter ablation scheduled for 8/7 with Dr Lovena Le.   CorVue thoracic impedance normal.   Prescribed: Spironolactone 25 mg take 1 tablet daily     Labs: 10/11/2021 Creatinine 0.86, BUN 12, Potassium 4.7, Sodium 145, GFR 94 A complete set of results can be found in Results Review.   Recommendations:  Left voice mail with ICM number and encouraged to call if experiencing any fluid symptoms.   Follow-up plan: ICM clinic phone appointment on 12/06/2021.   91 day device clinic remote transmission 01/10/2022.     EP/Cardiology Office Visits:   Due 10/09/2022 with Dr Rayann Heman but no recall.     Copy of ICM check sent to Dr. Rayann Heman.     3 month ICM trend: 11/03/2021.    12-14 Month ICM trend:     Rosalene Billings, RN 11/05/2021 8:05 AM

## 2021-11-18 ENCOUNTER — Other Ambulatory Visit: Payer: Self-pay | Admitting: Internal Medicine

## 2021-11-18 ENCOUNTER — Other Ambulatory Visit: Payer: Self-pay | Admitting: Family Medicine

## 2021-11-18 DIAGNOSIS — I5022 Chronic systolic (congestive) heart failure: Secondary | ICD-10-CM

## 2021-11-18 DIAGNOSIS — I10 Essential (primary) hypertension: Secondary | ICD-10-CM

## 2021-11-19 DIAGNOSIS — F1290 Cannabis use, unspecified, uncomplicated: Secondary | ICD-10-CM | POA: Diagnosis not present

## 2021-11-19 DIAGNOSIS — F5104 Psychophysiologic insomnia: Secondary | ICD-10-CM | POA: Diagnosis not present

## 2021-11-19 DIAGNOSIS — I10 Essential (primary) hypertension: Secondary | ICD-10-CM | POA: Diagnosis not present

## 2021-11-19 DIAGNOSIS — G4733 Obstructive sleep apnea (adult) (pediatric): Secondary | ICD-10-CM | POA: Diagnosis not present

## 2021-11-19 DIAGNOSIS — F17210 Nicotine dependence, cigarettes, uncomplicated: Secondary | ICD-10-CM | POA: Diagnosis not present

## 2021-12-02 ENCOUNTER — Ambulatory Visit: Payer: Medicare PPO | Admitting: Internal Medicine

## 2021-12-03 NOTE — Pre-Procedure Instructions (Signed)
Attempted to call patient regarding procedure instructions.  Left voice mail on the following items: Arrival time 0530 Nothing to eat or drink after midnight No meds AM of procedure Responsible person to drive you home and stay with you for 24 hrs  Have you missed any doses of anti-coagulant Xarelto- take dose Friday, Saturday and Sunday, none on Monday

## 2021-12-06 ENCOUNTER — Ambulatory Visit (HOSPITAL_COMMUNITY)
Admission: RE | Disposition: A | Discharge status: Home / Self Care | Payer: Self-pay | Source: Home / Self Care | Attending: Internal Medicine

## 2021-12-06 ENCOUNTER — Ambulatory Visit (HOSPITAL_COMMUNITY): Payer: Medicare PPO | Admitting: Certified Registered Nurse Anesthetist

## 2021-12-06 ENCOUNTER — Ambulatory Visit (HOSPITAL_BASED_OUTPATIENT_CLINIC_OR_DEPARTMENT_OTHER): Payer: Medicare PPO | Admitting: Certified Registered Nurse Anesthetist

## 2021-12-06 ENCOUNTER — Encounter (HOSPITAL_COMMUNITY): Payer: Self-pay | Admitting: Internal Medicine

## 2021-12-06 ENCOUNTER — Ambulatory Visit (HOSPITAL_COMMUNITY)
Admission: RE | Admit: 2021-12-06 | Discharge: 2021-12-06 | Disposition: A | Discharge status: Home / Self Care | Payer: Medicare PPO | Attending: Internal Medicine | Admitting: Internal Medicine

## 2021-12-06 ENCOUNTER — Ambulatory Visit (INDEPENDENT_AMBULATORY_CARE_PROVIDER_SITE_OTHER): Payer: Medicare PPO

## 2021-12-06 ENCOUNTER — Other Ambulatory Visit: Payer: Self-pay

## 2021-12-06 DIAGNOSIS — Z95810 Presence of automatic (implantable) cardiac defibrillator: Secondary | ICD-10-CM | POA: Insufficient documentation

## 2021-12-06 DIAGNOSIS — I483 Typical atrial flutter: Secondary | ICD-10-CM

## 2021-12-06 DIAGNOSIS — I110 Hypertensive heart disease with heart failure: Secondary | ICD-10-CM | POA: Diagnosis not present

## 2021-12-06 DIAGNOSIS — I5022 Chronic systolic (congestive) heart failure: Secondary | ICD-10-CM | POA: Diagnosis not present

## 2021-12-06 DIAGNOSIS — Z6841 Body Mass Index (BMI) 40.0 and over, adult: Secondary | ICD-10-CM | POA: Diagnosis not present

## 2021-12-06 DIAGNOSIS — Z87891 Personal history of nicotine dependence: Secondary | ICD-10-CM | POA: Insufficient documentation

## 2021-12-06 DIAGNOSIS — I4892 Unspecified atrial flutter: Secondary | ICD-10-CM | POA: Diagnosis not present

## 2021-12-06 DIAGNOSIS — G4733 Obstructive sleep apnea (adult) (pediatric): Secondary | ICD-10-CM | POA: Diagnosis not present

## 2021-12-06 DIAGNOSIS — I509 Heart failure, unspecified: Secondary | ICD-10-CM | POA: Diagnosis not present

## 2021-12-06 DIAGNOSIS — I2510 Atherosclerotic heart disease of native coronary artery without angina pectoris: Secondary | ICD-10-CM

## 2021-12-06 HISTORY — PX: A-FLUTTER ABLATION: EP1230

## 2021-12-06 LAB — CBC
HCT: 40.2 % (ref 39.0–52.0)
Hemoglobin: 13.5 g/dL (ref 13.0–17.0)
MCH: 30.2 pg (ref 26.0–34.0)
MCHC: 33.6 g/dL (ref 30.0–36.0)
MCV: 89.9 fL (ref 80.0–100.0)
Platelets: 198 10*3/uL (ref 150–400)
RBC: 4.47 MIL/uL (ref 4.22–5.81)
RDW: 13.9 % (ref 11.5–15.5)
WBC: 9.8 10*3/uL (ref 4.0–10.5)
nRBC: 0 % (ref 0.0–0.2)

## 2021-12-06 LAB — BASIC METABOLIC PANEL
Anion gap: 6 (ref 5–15)
BUN: 18 mg/dL (ref 8–23)
CO2: 26 mmol/L (ref 22–32)
Calcium: 8.9 mg/dL (ref 8.9–10.3)
Chloride: 108 mmol/L (ref 98–111)
Creatinine, Ser: 0.92 mg/dL (ref 0.61–1.24)
GFR, Estimated: >60 mL/min (ref >60)
Glucose, Bld: 157 mg/dL — ABNORMAL HIGH (ref 70–99)
Potassium: 3.8 mmol/L (ref 3.5–5.1)
Sodium: 140 mmol/L (ref 135–145)

## 2021-12-06 SURGERY — A-FLUTTER ABLATION
Anesthesia: General

## 2021-12-06 MED ORDER — MIDAZOLAM HCL 2 MG/2ML IJ SOLN
INTRAMUSCULAR | Status: AC
  Filled 2021-12-06: qty 2

## 2021-12-06 MED ORDER — MIDAZOLAM HCL 2 MG/2ML IJ SOLN
INTRAMUSCULAR | Status: DC | PRN
  Administered 2021-12-06: 2 mg via INTRAVENOUS

## 2021-12-06 MED ORDER — DEXAMETHASONE SODIUM PHOSPHATE 10 MG/ML IJ SOLN
INTRAMUSCULAR | Status: DC | PRN
  Administered 2021-12-06: 8 mg via INTRAVENOUS

## 2021-12-06 MED ORDER — PHENYLEPHRINE HCL-NACL 20-0.9 MG/250ML-% IV SOLN
INTRAVENOUS | Status: DC | PRN
  Administered 2021-12-06: 50 ug/min via INTRAVENOUS

## 2021-12-06 MED ORDER — SUCCINYLCHOLINE CHLORIDE 200 MG/10ML IV SOSY
PREFILLED_SYRINGE | INTRAVENOUS | Status: DC | PRN
  Administered 2021-12-06: 140 mg via INTRAVENOUS

## 2021-12-06 MED ORDER — BUPIVACAINE HCL (PF) 0.25 % IJ SOLN
INTRAMUSCULAR | Status: AC
  Filled 2021-12-06: qty 30

## 2021-12-06 MED ORDER — ROCURONIUM BROMIDE 10 MG/ML (PF) SYRINGE
PREFILLED_SYRINGE | INTRAVENOUS | Status: DC | PRN
  Administered 2021-12-06: 80 mg via INTRAVENOUS

## 2021-12-06 MED ORDER — HEPARIN (PORCINE) IN NACL 1000-0.9 UT/500ML-% IV SOLN
INTRAVENOUS | Status: AC
  Filled 2021-12-06: qty 500

## 2021-12-06 MED ORDER — ONDANSETRON HCL 4 MG/2ML IJ SOLN
INTRAMUSCULAR | Status: DC | PRN
  Administered 2021-12-06: 4 mg via INTRAVENOUS

## 2021-12-06 MED ORDER — HEPARIN (PORCINE) IN NACL 1000-0.9 UT/500ML-% IV SOLN
INTRAVENOUS | Status: DC | PRN
  Administered 2021-12-06: 500 mL

## 2021-12-06 MED ORDER — SUGAMMADEX SODIUM 200 MG/2ML IV SOLN
INTRAVENOUS | Status: DC | PRN
  Administered 2021-12-06: 400 mg via INTRAVENOUS

## 2021-12-06 MED ORDER — FENTANYL CITRATE (PF) 250 MCG/5ML IJ SOLN
INTRAMUSCULAR | Status: DC | PRN
Start: 2021-12-06 — End: 2021-12-06
  Administered 2021-12-06: 50 ug via INTRAVENOUS

## 2021-12-06 MED ORDER — EPHEDRINE SULFATE (PRESSORS) 50 MG/ML IJ SOLN
INTRAMUSCULAR | Status: DC | PRN
  Administered 2021-12-06: 5 mg via INTRAVENOUS

## 2021-12-06 MED ORDER — SODIUM CHLORIDE 0.9 % IV SOLN: INTRAVENOUS | Status: DC

## 2021-12-06 MED ORDER — HEPARIN SODIUM (PORCINE) 1000 UNIT/ML IJ SOLN
INTRAMUSCULAR | Status: DC | PRN
  Administered 2021-12-06: 1000 [IU] via INTRAVENOUS

## 2021-12-06 MED ORDER — FENTANYL CITRATE (PF) 100 MCG/2ML IJ SOLN
INTRAMUSCULAR | Status: AC
  Filled 2021-12-06: qty 2

## 2021-12-06 MED ORDER — HEPARIN SODIUM (PORCINE) 1000 UNIT/ML IJ SOLN
INTRAMUSCULAR | Status: AC
  Filled 2021-12-06: qty 10

## 2021-12-06 MED ORDER — PHENYLEPHRINE 80 MCG/ML (10ML) SYRINGE FOR IV PUSH (FOR BLOOD PRESSURE SUPPORT)
PREFILLED_SYRINGE | INTRAVENOUS | Status: DC | PRN
  Administered 2021-12-06 (×4): 80 ug via INTRAVENOUS

## 2021-12-06 MED ORDER — LIDOCAINE 2% (20 MG/ML) 5 ML SYRINGE
INTRAMUSCULAR | Status: DC | PRN
  Administered 2021-12-06: 60 mg via INTRAVENOUS

## 2021-12-06 MED ORDER — ETOMIDATE 2 MG/ML IV SOLN
INTRAVENOUS | Status: DC | PRN
  Administered 2021-12-06: 16 mg via INTRAVENOUS

## 2021-12-06 SURGICAL SUPPLY — 11 items
BAG SNAP BAND KOVER 36X36 (MISCELLANEOUS) ×1 IMPLANT
CATH SMTCH THERMOCOOL SF FJ (CATHETERS) ×1 IMPLANT
CATH WEBSTER BI DIR CS D-F CRV (CATHETERS) ×1 IMPLANT
PACK EP LATEX FREE (CUSTOM PROCEDURE TRAY) ×1
PACK EP LF (CUSTOM PROCEDURE TRAY) ×1 IMPLANT
PAD DEFIB RADIO PHYSIO CONN (PAD) ×2 IMPLANT
PATCH CARTO3 (PAD) ×1 IMPLANT
SHEATH PINNACLE 6F 10CM (SHEATH) IMPLANT
SHEATH PINNACLE 7F 10CM (SHEATH) ×1 IMPLANT
SHEATH PINNACLE 8F 10CM (SHEATH) ×1 IMPLANT
TUBING SMART ABLATE COOLFLOW (TUBING) ×1 IMPLANT

## 2021-12-06 NOTE — H&P (Signed)
HPI [name] is referred by Dr. Greggory Werner for evaluation of atrial flutter. He is a pleasant morbidly obese man with a h/o chronic systolic heart failure and a DCM, s/p ICD Insertion. He has been found to have atrial flutter with a CVR/RVR. He has minimal palpitations but does have more dyspnea with exertion. No syncope. He has been placed on an [place]. No difficulties with bleeding.    No Known Allergies           Current Outpatient Medications  Medication Sig Dispense Refill   Ascorbic Acid (VITAMIN C) 1000 MG tablet Take 2,000 mg by mouth daily.       bisoprolol (ZEBETA) 5 MG tablet TAKE 1 TABLET (5 MG TOTAL) BY MOUTH DAILY. 90 tablet 3   finasteride (PROSCAR) 5 MG tablet Take 1 tablet (5 mg total) by mouth daily. 90 tablet 3   Multiple Vitamin (MULTIVITAMIN) capsule Take 1 capsule by mouth 2 (two) times daily.        OZEMPIC, 0.25 OR 0.5 MG/DOSE, 2 MG/3ML SOPN Inject 0.5 mg into the skin once a week.       rivaroxaban (XARELTO) 20 MG TABS tablet Take 1 tablet (20 mg total) by mouth daily with supper. 90 tablet 3   rosuvastatin (CRESTOR) 20 MG tablet Take 1 tablet (20 mg total) by mouth daily. 90 tablet 3   sacubitril-valsartan (ENTRESTO) 24-26 MG Take 1 tablet by mouth 2 (two) times daily. 180 tablet 3   spironolactone (ALDACTONE) 25 MG tablet TAKE 1 TABLET EVERY DAY 90 tablet 3   VITAMIN D PO Take 1 tablet by mouth daily.        No current facility-administered medications for this visit.            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   Arthritis      "fingers" (05/03/2017)   CHF (congestive heart failure) (HCC)     COPD (chronic obstructive pulmonary disease) ([place])     Coronary artery disease      nonobstructive mid RCA by 2009 cath at [facility] in [place]   Enlarged prostate     High cholesterol     Hypertension     Melanoma of back ([place]) 2013   Meniscal injury     Myocardial infarction  ([place]) 1998    mild   OSA on CPAP      "severe" (05/03/2017)   Right knee meniscal tear        ROS:    All systems reviewed and negative except as noted in the HPI.          Past Surgical History:  Procedure Laterality Date   CARDIAC CATHETERIZATION   X 2   CARDIAC DEFIBRILLATOR PLACEMENT   05/03/2017   CYST REMOVAL LEG Bilateral 2013   HERNIA REPAIR       ICD IMPLANT N/A 05/03/2017    Procedure: ICD IMPLANT;  Surgeon: Thompson Grayer, MD;  Location: [facility];  Service: Cardiovascular;  Laterality: N/A;   KNEE ARTHROSCOPY WITH MEDIAL MENISECTOMY Right 10/18/2017    Procedure: RIGHT KNEE ARTHROSCOPY WITH PARTIAL MEDIAL MENISCECTOMY;  Surgeon: Leandrew Koyanagi, MD;  Location: [facility];  Service: Orthopedics;  Laterality: Right;   KNEE CARTILAGE SURGERY Left 1972   LAPAROSCOPIC CHOLECYSTECTOMY   2015   LAPAROSCOPIC GASTRIC BAND REMOVAL WITH LAPAROSCOPIC GASTRIC SLEEVE RESECTION   11/05/2014   MELANOMA EXCISION  2013    "back"   [place]   2014             Family History  Problem Relation Age of Onset   Early death Mother     Cancer Sister          breast   HIV Brother     Cancer Maternal Grandmother          breast   Stroke Maternal Grandfather     Cancer Sister          melanoma        Social History         Socioeconomic History   Marital status: Divorced      Spouse name: Not on file   Number of children: Not on file   Years of education: Not on file   Highest education level: Not on file  Occupational History   Not on file  Tobacco Use   Smoking status: Former      Packs/day: 1.00      Years: 47.00      Total pack years: 47.00      Types: Cigarettes      Start date: 06/24/1967      Quit date: 05/02/2013      Years since quitting: 8.4   Smokeless tobacco: Never  Vaping Use   Vaping Use: Every day  Substance and Sexual Activity   Alcohol use: Yes      Comment:  "1-2 glasses of wine/month"   Drug use: No   Sexual activity: Yes  Other  Topics Concern   Not on file  Social History Narrative   Not on file    Social Determinants of Health        Financial Resource Strain: Not on file  Food Insecurity: Food Insecurity Present (06/23/2021)    Hunger Vital Sign     Worried About Running Out of Food in the Last Year: Sometimes true     Ran Out of Food in the Last Year: Sometimes true  Transportation Needs: No Transportation Needs (06/21/2021)    PRAPARE - Armed forces logistics/support/administrative officer (Medical): No     Lack of Transportation (Non-Medical): No  Physical Activity: Insufficiently Active (06/21/2021)    Exercise Vital Sign     Days of Exercise per Week: 1 day     Minutes of Exercise per Session: 10 min  Stress: Stress Concern Present (06/23/2021)    [place]     Feeling of Stress : Rather much  Social Connections: Moderately Integrated (06/21/2021)    Social Connection and Isolation Panel [NHANES]     Frequency of Communication with Friends and Family: Twice a week     Frequency of Social Gatherings with Friends and Family: Once a week     Attends Religious Services: 1 to 4 times per year     Active Member of [employer] or Organizations: Yes     Attends Archivist Meetings: 1 to 4 times per year     Marital Status: Divorced  Intimate Partner Violence: Not At Risk (06/21/2021)    Humiliation, Afraid, Rape, and Kick questionnaire     Fear of Current or Ex-Partner: No     Emotionally Abused: No     Physically Abused: No     Sexually Abused: No        BP 118/78   Pulse 94   Ht 5'  9" (1.753 m)   Wt (!) 301 lb (136.5 kg)   SpO2 100%   BMI 44.45 kg/m    Physical Exam:   Well appearing NAD HEENT: Unremarkable Neck:  No JVD, no thyromegally Lymphatics:  No adenopathy Back:  No CVA tenderness Lungs:  Clear with no wheezes HEART:  Regular rate rhythm, no murmurs, no rubs, no clicks Abd:  soft, positive bowel sounds, no organomegally,  no rebound, no guarding Ext:  2 plus pulses, no edema, no cyanosis, no clubbing Skin:  No rashes no nodules Neuro:  CN II through XII intact, motor grossly intact   EKG - reviewed. Typical atrial flutter.   DEVICE  Normal device function.  See PaceArt for details.    Assess/Plan:  Atrial flutter - I discussed the treatment options with the patient and the risks/benefits/goals/expectations of EP study and ablation were reviewed and he wishes to proceed. Chronic systolic heart failure - he is class 2B. I suspect that he will improve once he is back in rhythm. ICD - his Abbott single chamber ICD is working normally. We will recheck in several months. Obesity - we discussed the importance of weight loss and treatment of sleep apnea.,   Russell Werner

## 2021-12-06 NOTE — Transfer of Care (Signed)
Immediate Anesthesia Transfer of Care Note  Patient: Russell Werner  Procedure(s) Performed: A-FLUTTER ABLATION  Patient Location: Cath Lab  Anesthesia Type:General  Level of Consciousness: awake, alert  and oriented  Airway & Oxygen Therapy: Patient Spontanous Breathing and Patient connected to nasal cannula oxygen  Post-op Assessment: Report given to RN and Post -op Vital signs reviewed and stable  Post vital signs: Reviewed and stable  Last Vitals:  Vitals Value Taken Time  BP 107/56 12/06/21 0919  Temp 36.7 C 12/06/21 0918  Pulse 93 12/06/21 0922  Resp 15 12/06/21 0922  SpO2 90 % 12/06/21 0922  Vitals shown include unvalidated device data.  Last Pain:  Vitals:   12/06/21 0918  TempSrc: Tympanic  PainSc: 0-No pain         Complications: No notable events documented.

## 2021-12-06 NOTE — Anesthesia Preprocedure Evaluation (Addendum)
Anesthesia Evaluation  Patient identified by MRN, date of birth, ID band Patient awake    Reviewed: Allergy & Precautions, NPO status , Patient's Chart, lab work & pertinent test results  Airway Mallampati: II  TM Distance: >3 FB Neck ROM: Full    Dental  (+) Missing, Poor Dentition, Chipped   Pulmonary sleep apnea , COPD, Patient abstained from smoking., former smoker,     + decreased breath sounds      Cardiovascular hypertension, Pt. on medications + CAD and +CHF  + Cardiac Defibrillator  Rhythm:Irregular Rate:Normal  Echo (06/2021); - EF 15%   Neuro/Psych negative psych ROS   GI/Hepatic negative GI ROS, Neg liver ROS,   Endo/Other  negative endocrine ROS  Renal/GU negative Renal ROS     Musculoskeletal  (+) Arthritis ,   Abdominal (+) + obese,   Peds  Hematology negative hematology ROS (+)   Anesthesia Other Findings   Reproductive/Obstetrics                           Anesthesia Physical Anesthesia Plan  ASA: 4  Anesthesia Plan: General   Post-op Pain Management:    Induction: Intravenous  PONV Risk Score and Plan: 2 and Ondansetron and Midazolam  Airway Management Planned: Oral ETT  Additional Equipment: None  Intra-op Plan:   Post-operative Plan: Extubation in OR  Informed Consent: I have reviewed the patients History and Physical, chart, labs and discussed the procedure including the risks, benefits and alternatives for the proposed anesthesia with the patient or authorized representative who has indicated his/her understanding and acceptance.     Dental advisory given  Plan Discussed with: CRNA  Anesthesia Plan Comments:       Anesthesia Quick Evaluation

## 2021-12-06 NOTE — Anesthesia Procedure Notes (Signed)
Procedure Name: Intubation Date/Time: 12/06/2021 7:45 AM  Performed by: Inda Coke, CRNAPre-anesthesia Checklist: Patient identified, Emergency Drugs available, Suction available, Timeout performed and Patient being monitored Patient Re-evaluated:Patient Re-evaluated prior to induction Oxygen Delivery Method: Circle system utilized Preoxygenation: Pre-oxygenation with 100% oxygen Induction Type: IV induction and Rapid sequence Ventilation: Mask ventilation without difficulty Laryngoscope Size: Mac and 4 Grade View: Grade I Tube type: Oral Tube size: 7.5 mm Airway Equipment and Method: Stylet Placement Confirmation: ETT inserted through vocal cords under direct vision, positive ETCO2, CO2 detector and breath sounds checked- equal and bilateral Secured at: 23 cm Tube secured with: Tape Dental Injury: Teeth and Oropharynx as per pre-operative assessment

## 2021-12-06 NOTE — Anesthesia Postprocedure Evaluation (Signed)
Anesthesia Post Note  Patient: Russell Werner  Procedure(s) Performed: A-FLUTTER ABLATION     Patient location during evaluation: PACU Anesthesia Type: General Level of consciousness: awake and alert Pain management: pain level controlled Vital Signs Assessment: post-procedure vital signs reviewed and stable Respiratory status: spontaneous breathing, nonlabored ventilation, respiratory function stable and patient connected to nasal cannula oxygen Cardiovascular status: blood pressure returned to baseline and stable Postop Assessment: no apparent nausea or vomiting Anesthetic complications: no   No notable events documented.  Last Vitals:  Vitals:   12/06/21 1020 12/06/21 1030  BP: (!) 109/57 99/69  Pulse: 94 96  Resp: 15 (!) 23  Temp:    SpO2: 96% 92%    Last Pain:  Vitals:   12/06/21 1030  TempSrc:   PainSc: 2                  Effie Berkshire

## 2021-12-06 NOTE — Progress Notes (Signed)
Site area- right  Site Prior to Removal- 0   Pressure Applied For-  15 MInutes   Bedrest Beginning at - 1013   Manual- Yes   Patient Status During Pull- Stable    Post Pull Groin Site- 0   Post Pull Instructions Given- Yes   Post Pull Pulses Present- Yes    Dressing Applied- Tegaderm and Gauze Dressing    Comments:

## 2021-12-06 NOTE — Discharge Instructions (Addendum)

## 2021-12-07 ENCOUNTER — Encounter (HOSPITAL_COMMUNITY): Payer: Self-pay | Admitting: Internal Medicine

## 2021-12-07 ENCOUNTER — Telehealth: Payer: Self-pay

## 2021-12-07 MED FILL — Fentanyl Citrate Preservative Free (PF) Inj 100 MCG/2ML: INTRAMUSCULAR | Qty: 1 | Status: AC

## 2021-12-07 MED FILL — Bupivacaine HCl Preservative Free (PF) Inj 0.25%: INTRAMUSCULAR | Qty: 30 | Status: AC

## 2021-12-07 NOTE — Telephone Encounter (Signed)
Remote ICM transmission received.  Attempted call to patient regarding ICM remote transmission and left detailed message per DPR.  Advised to return call for any fluid symptoms or questions. Next ICM remote transmission scheduled 12/13/2021.    

## 2021-12-07 NOTE — Progress Notes (Signed)
EPIC Encounter for ICM Monitoring  Patient Name: Russell Werner is a 66 y.o. male Date: 12/07/2021 Primary Care Physican: Elby Beck, DO Primary Cardiologist: In [place] Electrophysiologist: [place] 10/27/2021 Office Weight 301 lbs                                                          Attempted call to patient and unable to reach.  Left detailed message per DPR regarding transmission. Transmission reviewed.   A-flutter ablation completed 8/7.   CorVue thoracic impedance suggesting possible fluid accumulation starting 7/31 but starting to trend toward baseline.   Prescribed: Spironolactone 25 mg take 1 tablet daily     Labs:   12/06/2021 Creatinine 0.92, BUN 18, Potassium 3.8, Sodium 140, GFR >60 10/11/2021 Creatinine 0.86, BUN 12, Potassium 4.7, Sodium 145, GFR 94 A complete set of results can be found in Results Review.   Recommendations:  Left voice mail with ICM number and encouraged to call if experiencing any fluid symptoms.   Follow-up plan: ICM clinic phone appointment on 12/13/2021 to recheck fluid levels.   91 day device clinic remote transmission 01/10/2022.     EP/Cardiology Office Visits:   Due 10/09/2022 with Dr Rayann Heman but no recall.     Copy of ICM check sent to Dr. Rayann Heman.     3 month ICM trend: 12/06/2021.    12-14 Month ICM trend:     Rosalene Billings, RN 12/07/2021 9:27 AM

## 2021-12-08 ENCOUNTER — Telehealth: Payer: Self-pay | Admitting: Internal Medicine

## 2021-12-08 NOTE — Telephone Encounter (Signed)
Spoke with the patient and offered him DOD slot tomorrow at 10:30am. Patient is agreeable.

## 2021-12-08 NOTE — Telephone Encounter (Signed)
Patient has an ablation done on Monday, he states he feels like he has been run over by a truck. His whole body hurts. He feels his breathing is labored.  Also the pharmacy is not going to send him any more Xarelto, he doesn't have the money to pay for it.  He only has 5 pill left.

## 2021-12-08 NOTE — Telephone Encounter (Signed)
Spoke with pt who had an Aflutter ablation on 12/06/2021 by Dr Lovena Le.  Pt states he feels as if he has "been run over by a truck."  Pt states he has soreness in his abs as well.  He denies fever, nausea or vomiting.  States he did have some chills this morning.  Ate well yesterday but does not feel like eating today.  He complains of some "labored breathing with mild swelling in his feet and ankles as well as his left arm.  He states he cannot see the surgical site in his right groin to access for redness or drainage .  He states he has not taken any acetaminophen or Ibuprofen but is smoking "pot" which is helping some.  Pt advised will forward to Dr Tanna Furry nurse and Deniece Ree for further assistance with symptoms.  See note from Sharman Cheek, RN on 12/07/2021  _______________________________________________________________________________ Recommendations:  Left voice mail with ICM number and encouraged to call if experiencing any fluid symptoms.   Follow-up plan: ICM clinic phone appointment on 12/13/2021 to recheck fluid levels.   91 day device clinic remote transmission 01/10/2022.     EP/Cardiology Office Visits:   Due 10/09/2022 with Dr Rayann Heman but no recall.     Copy of ICM check sent to Dr. Rayann Heman.      3 month ICM trend: 12/06/2021.      12-14 Month ICM trend:       Rosalene Billings, RN 12/07/2021 9:27 AM

## 2021-12-09 ENCOUNTER — Ambulatory Visit: Payer: Medicare PPO | Admitting: Internal Medicine

## 2021-12-13 ENCOUNTER — Telehealth: Payer: Self-pay | Admitting: Internal Medicine

## 2021-12-13 ENCOUNTER — Ambulatory Visit (INDEPENDENT_AMBULATORY_CARE_PROVIDER_SITE_OTHER): Payer: Medicare PPO

## 2021-12-13 DIAGNOSIS — I5022 Chronic systolic (congestive) heart failure: Secondary | ICD-10-CM

## 2021-12-13 DIAGNOSIS — Z95810 Presence of automatic (implantable) cardiac defibrillator: Secondary | ICD-10-CM

## 2021-12-13 NOTE — Telephone Encounter (Signed)
Patient calling the office for samples of medication:   1.  What medication and dosage are you requesting samples for? rivaroxaban (XARELTO) 20 MG TABS tablet  2.  Are you currently out of this medication? Patient is out of medication.

## 2021-12-13 NOTE — Progress Notes (Signed)
EPIC Encounter for ICM Monitoring  Patient Name: Russell Werner is a 66 y.o. male Date: 12/13/2021 Primary Care Physican: Elby Beck, DO Primary Cardiologist: In [place] Electrophysiologist: [place] 10/27/2021 Office Weight 301 lbs                                                          Spoke with patient and heart failure questions reviewed.  Pt reports his breathing is a little labored.   He reports he is compliant with low salt and limiting fluid intake.    CorVue thoracic impedance suggesting possible fluid accumulation starting 7/31.   Prescribed: Spironolactone 25 mg take 1 tablet daily     Labs:   12/06/2021 Creatinine 0.92, BUN 18, Potassium 3.8, Sodium 140, GFR >60 10/11/2021 Creatinine 0.86, BUN 12, Potassium 4.7, Sodium 145, GFR 94 A complete set of results can be found in Results Review.   Recommendations:  Recommendation to limit salt intake to 2000 mg daily and fluid intake to 64 oz daily.  Encouraged to call if experiencing any fluid symptoms.    Follow-up plan: ICM clinic phone appointment on 12/20/2021 to recheck fluid levels.   91 day device clinic remote transmission 01/10/2022.     EP/Cardiology Office Visits:   Due 10/09/2022 with Dr Rayann Heman but no recall.     Copy of ICM check sent to Dr. Rayann Heman.     3 month ICM trend: 12/13/2021.    12-14 Month ICM trend:     Rosalene Billings, RN 12/13/2021 1:12 PM

## 2021-12-14 NOTE — Telephone Encounter (Signed)
Called pt to explain to him that his medication Xarelto is expensive because he has not met his deductible, until he meet his deductible, the medication is going to be expensive. I informed the pt that he could either apply for pt assistance or change his medication to warfarin, which this medication is cheaper. Pt stated that he would be interested in changing his medication. Please address

## 2021-12-14 NOTE — Telephone Encounter (Signed)
There was no mention of pt having a deductible. He has [employer] which typically does not have a deductible. He is likely in the donut hole. Called pt. Reports his Xarelto was $67 last month, now $147 this month. Gets Entresto from pt assistance but is on Ozempic as well. Discussed changing to warfarin would include weekly INR monitoring plus Lovenox bridging given his recent ablation, plus he lives almost an hour away from the closest [place] office in [place]. Also mentioned Engineer, maintenance program that decreases Xarelto cost to $85/month for Medicare pts in the donut hole. He will call them to apply.

## 2021-12-14 NOTE — Telephone Encounter (Signed)
Pt is on the correct dose of Xarelto. He has refills on his medication. Need to clarify why he is calling for samples. If cost is an issue, would need to fill out pt assistance or change to warfarin (not preferred since pt just underwent aflutter ablation a week ago).

## 2021-12-15 DIAGNOSIS — I4892 Unspecified atrial flutter: Secondary | ICD-10-CM | POA: Diagnosis not present

## 2021-12-15 DIAGNOSIS — F17210 Nicotine dependence, cigarettes, uncomplicated: Secondary | ICD-10-CM | POA: Diagnosis not present

## 2021-12-15 DIAGNOSIS — Z6841 Body Mass Index (BMI) 40.0 and over, adult: Secondary | ICD-10-CM | POA: Diagnosis not present

## 2021-12-15 DIAGNOSIS — G4733 Obstructive sleep apnea (adult) (pediatric): Secondary | ICD-10-CM | POA: Diagnosis not present

## 2021-12-15 DIAGNOSIS — F1290 Cannabis use, unspecified, uncomplicated: Secondary | ICD-10-CM | POA: Diagnosis not present

## 2021-12-20 ENCOUNTER — Ambulatory Visit (INDEPENDENT_AMBULATORY_CARE_PROVIDER_SITE_OTHER): Payer: Medicare PPO

## 2021-12-20 DIAGNOSIS — Z95810 Presence of automatic (implantable) cardiac defibrillator: Secondary | ICD-10-CM

## 2021-12-20 DIAGNOSIS — I5022 Chronic systolic (congestive) heart failure: Secondary | ICD-10-CM

## 2021-12-21 ENCOUNTER — Telehealth: Payer: Self-pay

## 2021-12-21 NOTE — Telephone Encounter (Signed)
Remote ICM transmission received.  Attempted call to patient regarding ICM remote transmission and left detailed message per DPR.  Advised to return call for any fluid symptoms or questions. Next ICM remote transmission scheduled 01/11/2022.

## 2021-12-21 NOTE — Progress Notes (Signed)
EPIC Encounter for ICM Monitoring  Patient Name: VALON GLASSCOCK is a 66 y.o. male Date: 12/21/2021 Primary Care Physican: Elby Beck, DO Primary Cardiologist: In [place] Electrophysiologist: Allred 10/27/2021 Office Weight 301 lbs                                                          Attempted call to patient and unable to reach.  Left detailed message per DPR regarding transmission. Transmission reviewed.    CorVue thoracic impedance suggesting possible fluid accumulation starting 7/31.   Prescribed: Spironolactone 25 mg take 1 tablet daily     Labs:   12/06/2021 Creatinine 0.92, BUN 18, Potassium 3.8, Sodium 140, GFR >60 10/11/2021 Creatinine 0.86, BUN 12, Potassium 4.7, Sodium 145, GFR 94 A complete set of results can be found in Results Review.   Recommendations:  Left voice mail with ICM number and encouraged to call if experiencing any fluid symptoms.   Follow-up plan: ICM clinic phone appointment on 01/11/2022.   91 day device clinic remote transmission 01/10/2022.     EP/Cardiology Office Visits:   Due 10/09/2022 with Dr Rayann Heman but no recall.     Copy of ICM check sent to Dr. Rayann Heman.     3 month ICM trend: 12/20/2021.    12-14 Month ICM trend:     Rosalene Billings, RN 12/21/2021 9:27 AM

## 2022-01-18 ENCOUNTER — Ambulatory Visit: Payer: Medicare PPO | Attending: Internal Medicine | Admitting: Internal Medicine

## 2022-01-18 ENCOUNTER — Encounter: Payer: Self-pay | Admitting: Internal Medicine

## 2022-01-18 VITALS — BP 118/78 | HR 80 | Ht 69.0 in | Wt 315.8 lb

## 2022-01-18 DIAGNOSIS — I5042 Chronic combined systolic (congestive) and diastolic (congestive) heart failure: Secondary | ICD-10-CM | POA: Diagnosis not present

## 2022-01-18 LAB — CUP PACEART INCLINIC DEVICE CHECK
Battery Remaining Longevity: 63 mo
Brady Statistic RV Percent Paced: 0.02 %
Date Time Interrogation Session: 20230919084618
HighPow Impedance: 66.375
Implantable Lead Implant Date: 20190102
Implantable Lead Location: 753860
Implantable Pulse Generator Implant Date: 20190102
Lead Channel Impedance Value: 625 Ohm
Lead Channel Pacing Threshold Amplitude: 0.5 V
Lead Channel Pacing Threshold Amplitude: 0.5 V
Lead Channel Pacing Threshold Pulse Width: 0.5 ms
Lead Channel Pacing Threshold Pulse Width: 0.5 ms
Lead Channel Sensing Intrinsic Amplitude: 11.5 mV
Lead Channel Setting Pacing Amplitude: 2.5 V
Lead Channel Setting Pacing Pulse Width: 0.5 ms
Lead Channel Setting Sensing Sensitivity: 0.5 mV
Pulse Gen Serial Number: 9786935

## 2022-01-18 NOTE — Progress Notes (Signed)
HPI Russell Werner returns today for followup. He is a morbidly obese man with atrial flutter who underwent EP study and ablation 6 weeks ago. He has not had recurrent symptoms and is off of his xarelto. He denies chest pain or sob. He felt poorly for a few days after his ablation.  No Known Allergies   Current Outpatient Medications  Medication Sig Dispense Refill   Ascorbic Acid (VITAMIN C) 1000 MG tablet Take 2,000 mg by mouth daily.     bisoprolol (ZEBETA) 5 MG tablet TAKE 1 TABLET EVERY DAY 90 tablet 3   clotrimazole-betamethasone (LOTRISONE) cream Apply 1 Application topically.     finasteride (PROSCAR) 5 MG tablet TAKE 1 TABLET EVERY DAY 90 tablet 0   rosuvastatin (CRESTOR) 20 MG tablet TAKE 1 TABLET EVERY DAY 90 tablet 0   sacubitril-valsartan (ENTRESTO) 24-26 MG Take 1 tablet by mouth 2 (two) times daily. 180 tablet 3   spironolactone (ALDACTONE) 25 MG tablet TAKE 1 TABLET EVERY DAY 90 tablet 0   VITAMIN D PO Take 1 tablet by mouth daily.     OZEMPIC, 0.25 OR 0.5 MG/DOSE, 2 MG/3ML SOPN Inject 0.5 mg into the skin once a week.     rivaroxaban (XARELTO) 20 MG TABS tablet Take 1 tablet (20 mg total) by mouth daily with supper. (Patient not taking: Reported on 01/18/2022) 90 tablet 3   No current facility-administered medications for this visit.     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   Arthritis    "fingers" (05/03/2017)   CHF (congestive heart failure) (HCC)    COPD (chronic obstructive pulmonary disease) ([place])    Coronary artery disease    nonobstructive mid RCA by 2009 cath at [facility] in [place]   Enlarged prostate    High cholesterol    Hypertension    Melanoma of back ([place]) 2013   Meniscal injury    Myocardial infarction ([place]) 1998   mild   OSA on CPAP    "severe" (05/03/2017)   Right knee meniscal tear     ROS:   All systems reviewed and negative except as noted in the  HPI.   Past Surgical History:  Procedure Laterality Date   A-FLUTTER ABLATION N/A 12/06/2021   Procedure: A-FLUTTER ABLATION;  Surgeon: Evans Lance, MD;  Location: [facility];  Service: Cardiovascular;  Laterality: N/A;   CARDIAC CATHETERIZATION  X 2   CARDIAC DEFIBRILLATOR PLACEMENT  05/03/2017   CYST REMOVAL LEG Bilateral 2013   HERNIA REPAIR     ICD IMPLANT N/A 05/03/2017   Procedure: ICD IMPLANT;  Surgeon: Thompson Grayer, MD;  Location: [facility];  Service: Cardiovascular;  Laterality: N/A;   KNEE ARTHROSCOPY WITH MEDIAL MENISECTOMY Right 10/18/2017   Procedure: RIGHT KNEE ARTHROSCOPY WITH PARTIAL MEDIAL MENISCECTOMY;  Surgeon: Leandrew Koyanagi, MD;  Location: [facility];  Service: Orthopedics;  Laterality: Right;   KNEE CARTILAGE SURGERY Left 1972   LAPAROSCOPIC CHOLECYSTECTOMY  2015   LAPAROSCOPIC GASTRIC BAND REMOVAL WITH LAPAROSCOPIC GASTRIC SLEEVE RESECTION  11/05/2014   MELANOMA EXCISION  6010   "back"   UMBILICAL HERNIA REPAIR  2014     Family History  Problem Relation Age of Onset   Early death Mother    Cancer Sister        breast   HIV Brother    Cancer Maternal Grandmother        breast  Stroke Maternal Grandfather    Cancer Sister        melanoma     Social History   Socioeconomic History   Marital status: Divorced    Spouse name: Not on file   Number of children: Not on file   Years of education: Not on file   Highest education level: Not on file  Occupational History   Not on file  Tobacco Use   Smoking status: Former    Packs/day: 1.00    Years: 47.00    Total pack years: 47.00    Types: Cigarettes    Start date: 06/24/1967    Quit date: 05/02/2013    Years since quitting: 8.7   Smokeless tobacco: Never  Vaping Use   Vaping Use: Every day  Substance and Sexual Activity   Alcohol use: Yes    Comment:  "1-2 glasses of wine/month"   Drug use: No   Sexual activity: Yes  Other Topics Concern   Not on file  Social History Narrative    Not on file   Social Determinants of Health   Financial Resource Strain: Not on file  Food Insecurity: Food Insecurity Present (06/23/2021)   Hunger Vital Sign    Worried About Running Out of Food in the Last Year: Sometimes true    Ran Out of Food in the Last Year: Sometimes true  Transportation Needs: No Transportation Needs (06/21/2021)   PRAPARE - Hydrologist (Medical): No    Lack of Transportation (Non-Medical): No  Physical Activity: Insufficiently Active (06/21/2021)   Exercise Vital Sign    Days of Exercise per Week: 1 day    Minutes of Exercise per Session: 10 min  Stress: Stress Concern Present (06/23/2021)   [place]    Feeling of Stress : Rather much  Social Connections: Moderately Integrated (06/21/2021)   Social Connection and Isolation Panel [NHANES]    Frequency of Communication with Friends and Family: Twice a week    Frequency of Social Gatherings with Friends and Family: Once a week    Attends Religious Services: 1 to 4 times per year    Active Member of [employer] or Organizations: Yes    Attends Archivist Meetings: 1 to 4 times per year    Marital Status: Divorced  Human resources officer Violence: Not At Risk (06/21/2021)   Humiliation, Afraid, Rape, and Kick questionnaire    Fear of Current or Ex-Partner: No    Emotionally Abused: No    Physically Abused: No    Sexually Abused: No     BP 118/78   Pulse 80   Ht '5\' 9"'$  (1.753 m)   Wt (!) 315 lb 12.8 oz (143.2 kg)   SpO2 95%   BMI 46.64 kg/m   Physical Exam:  Well appearing NAD HEENT: Unremarkable Neck:  No JVD, no thyromegally Lymphatics:  No adenopathy Back:  No CVA tenderness Lungs:  Clear with no wheezes HEART:  Regular rate rhythm, no murmurs, no rubs, no clicks Abd:  soft, positive bowel sounds, no organomegally, no rebound, no guarding Ext:  2 plus pulses, no edema, no cyanosis, no  clubbing Skin:  No rashes no nodules Neuro:  CN II through XII intact, motor grossly intact  EKG - nsr with IVCD  DEVICE  Normal device function.  See PaceArt for details.   Assess/Plan:  Atrial flutter - he is doing well s/p EPS/RFA and has not had more  flutter.  Chronic systolic heart failure -he has class 2 symptoms. He will continue his current meds. Obesity - I strongly encouraged the patient to lose weight. ICD - his St. Jude single chamber device is working normally.   Carleene Overlie Tziporah Knoke,MD

## 2022-01-18 NOTE — Patient Instructions (Signed)
Medication Instructions:  Your physician recommends that you continue on your current medications as directed. Please refer to the Current Medication list given to you today.  *If you need a refill on your cardiac medications before your next appointment, please call your pharmacy*   Lab Work: NONE   If you have labs (blood work) drawn today and your tests are completely normal, you will receive your results only by: MyChart Message (if you have MyChart) OR A paper copy in the mail If you have any lab test that is abnormal or we need to change your treatment, we will call you to review the results.   Testing/Procedures: NONE    Follow-Up: At [facility], you and your health needs are our priority.  As part of our continuing mission to provide you with exceptional heart care, we have created designated Provider Care Teams.  These Care Teams include your primary Cardiologist (physician) and Advanced Practice Providers (APPs -  Physician Assistants and Nurse Practitioners) who all work together to provide you with the care you need, when you need it.  We recommend signing up for the patient portal called "MyChart".  Sign up information is provided on this After Visit Summary.  MyChart is used to connect with patients for Virtual Visits (Telemedicine).  Patients are able to view lab/test results, encounter notes, upcoming appointments, etc.  Non-urgent messages can be sent to your provider as well.   To learn more about what you can do with MyChart, go to [email or web address].    Your next appointment:   1 year(s)  The format for your next appointment:   In Person  Provider:   Gregg Taylor, MD    Other Instructions Thank you for choosing [facility]!    Important Information About Sugar       

## 2022-01-21 ENCOUNTER — Ambulatory Visit (INDEPENDENT_AMBULATORY_CARE_PROVIDER_SITE_OTHER): Payer: Medicare PPO

## 2022-01-21 DIAGNOSIS — I5042 Chronic combined systolic (congestive) and diastolic (congestive) heart failure: Secondary | ICD-10-CM

## 2022-01-21 DIAGNOSIS — Z95810 Presence of automatic (implantable) cardiac defibrillator: Secondary | ICD-10-CM

## 2022-01-21 NOTE — Progress Notes (Signed)
EPIC Encounter for ICM Monitoring  Patient Name: Russell Werner is a 66 y.o. male Date: 01/21/2022 Primary Care Physican: Elby Beck, DO Primary Cardiologist: In [place] Electrophysiologist: Lovena Le 01/18/2022 Office Weight 315 lbs                                                          Transmission reviewed.      CorVue thoracic impedance suggesting intermittent days with possible dryness from 9/7-9/17.   Prescribed: Spironolactone 25 mg take 1 tablet daily     Labs:   12/06/2021 Creatinine 0.92, BUN 18, Potassium 3.8, Sodium 140, GFR >60 10/11/2021 Creatinine 0.86, BUN 12, Potassium 4.7, Sodium 145, GFR 94 A complete set of results can be found in Results Review.   Recommendations:  Any recommendations given at [place] with Dr Lovena Le on 9/19.   Follow-up plan: ICM clinic phone appointment on 02/21/2022.   91 day device clinic remote transmission 04/11/2022.     EP/Cardiology Office Visits:  Recall 01/13/2023 with Dr Lovena Le     Copy of ICM check sent to Dr. Lovena Le.  3 month ICM trend: 01/21/2022.    12-14 Month ICM trend:     Rosalene Billings, RN 01/21/2022 5:23 PM

## 2022-02-21 ENCOUNTER — Telehealth: Payer: Self-pay

## 2022-02-21 ENCOUNTER — Ambulatory Visit (INDEPENDENT_AMBULATORY_CARE_PROVIDER_SITE_OTHER): Payer: Medicare PPO

## 2022-02-21 DIAGNOSIS — Z95810 Presence of automatic (implantable) cardiac defibrillator: Secondary | ICD-10-CM | POA: Diagnosis not present

## 2022-02-21 DIAGNOSIS — I5042 Chronic combined systolic (congestive) and diastolic (congestive) heart failure: Secondary | ICD-10-CM

## 2022-02-21 NOTE — Progress Notes (Signed)
EPIC Encounter for ICM Monitoring  Patient Name: Russell Werner is a 65 y.o. male Date: 02/21/2022 Primary Care Physican: Elby Beck, DO Primary Cardiologist: In [place] Electrophysiologist: Lovena Le 01/18/2022 Office Weight 315 lbs                                                          Attempted call to patient and unable to reach.  Left detailed message per DPR regarding transmission. Transmission reviewed.    CorVue thoracic impedance normal but was suggesting possible fluid accumulation from 10/4-10/19.   Prescribed: Spironolactone 25 mg take 1 tablet daily     Labs:   12/06/2021 Creatinine 0.92, BUN 18, Potassium 3.8, Sodium 140, GFR >60 10/11/2021 Creatinine 0.86, BUN 12, Potassium 4.7, Sodium 145, GFR 94 A complete set of results can be found in Results Review.   Recommendations:  Left voice mail with ICM number and encouraged to call if experiencing any fluid symptoms.   Follow-up plan: ICM clinic phone appointment on 03/28/2022.   91 day device clinic remote transmission 04/11/2022.     EP/Cardiology Office Visits:  Recall 01/13/2023 with Dr Lovena Le     Copy of ICM check sent to Dr. Lovena Le.  3 month ICM trend: 02/21/2022.    12-14 Month ICM trend:     Rosalene Billings, RN 02/21/2022 9:18 AM

## 2022-02-21 NOTE — Telephone Encounter (Signed)
Remote ICM transmission received.  Attempted call to patient regarding ICM remote transmission and left detailed message per DPR.  Advised to return call for any fluid symptoms or questions. Next ICM remote transmission scheduled 03/28/2022.

## 2022-02-28 DIAGNOSIS — G4733 Obstructive sleep apnea (adult) (pediatric): Secondary | ICD-10-CM | POA: Diagnosis not present

## 2022-03-28 ENCOUNTER — Ambulatory Visit (INDEPENDENT_AMBULATORY_CARE_PROVIDER_SITE_OTHER): Payer: Medicare PPO

## 2022-03-28 DIAGNOSIS — Z95810 Presence of automatic (implantable) cardiac defibrillator: Secondary | ICD-10-CM

## 2022-03-28 DIAGNOSIS — I5042 Chronic combined systolic (congestive) and diastolic (congestive) heart failure: Secondary | ICD-10-CM | POA: Diagnosis not present

## 2022-04-01 NOTE — Progress Notes (Signed)
EPIC Encounter for ICM Monitoring  Patient Name: Russell Werner is a 66 y.o. male Date: 04/01/2022 Primary Care Physican: Elby Beck, DO Primary Cardiologist: In [place] Electrophysiologist: Lovena Le 01/18/2022 Office Weight 315 lbs                                                          Spoke with patient and heart failure questions reviewed.  Transmission results reviewed.  Pt asymptomatic for fluid accumulation.  Reports feeling well at this time and voices no complaints.      CorVue thoracic impedance normal but was suggesting possible fluid accumulation from 10/31-11/05.   Prescribed: Spironolactone 25 mg take 1 tablet daily     Labs:   12/06/2021 Creatinine 0.92, BUN 18, Potassium 3.8, Sodium 140, GFR >60 10/11/2021 Creatinine 0.86, BUN 12, Potassium 4.7, Sodium 145, GFR 94 A complete set of results can be found in Results Review.   Recommendations:  No changes and encouraged to call if experiencing any fluid symptoms.   Follow-up plan: ICM clinic phone appointment on 05/09/2022.   91 day device clinic remote transmission 04/11/2022.     EP/Cardiology Office Visits:  Recall 01/13/2023 with Dr Lovena Le     Copy of ICM check sent to Dr. Lovena Le.   3 month ICM trend: 03/28/2022.    12-14 Month ICM trend:     Rosalene Billings, RN 04/01/2022 1:48 PM

## 2022-04-11 ENCOUNTER — Ambulatory Visit (INDEPENDENT_AMBULATORY_CARE_PROVIDER_SITE_OTHER): Payer: Medicare PPO

## 2022-04-11 DIAGNOSIS — I5042 Chronic combined systolic (congestive) and diastolic (congestive) heart failure: Secondary | ICD-10-CM | POA: Diagnosis not present

## 2022-04-12 LAB — CUP PACEART REMOTE DEVICE CHECK
Battery Remaining Longevity: 64 mo
Battery Remaining Percentage: 57 %
Battery Voltage: 2.98 V
Brady Statistic RV Percent Paced: 1 %
Date Time Interrogation Session: 20231211050914
HighPow Impedance: 72 Ohm
HighPow Impedance: 72 Ohm
Implantable Lead Connection Status: 753985
Implantable Lead Implant Date: 20190102
Implantable Lead Location: 753860
Implantable Pulse Generator Implant Date: 20190102
Lead Channel Impedance Value: 640 Ohm
Lead Channel Pacing Threshold Amplitude: 0.5 V
Lead Channel Pacing Threshold Pulse Width: 0.5 ms
Lead Channel Sensing Intrinsic Amplitude: 11.5 mV
Lead Channel Setting Pacing Amplitude: 2.5 V
Lead Channel Setting Pacing Pulse Width: 0.5 ms
Lead Channel Setting Sensing Sensitivity: 0.5 mV
Pulse Gen Serial Number: 9786935
Zone Setting Status: 755011

## 2022-04-20 DIAGNOSIS — G4733 Obstructive sleep apnea (adult) (pediatric): Secondary | ICD-10-CM | POA: Diagnosis not present

## 2022-04-20 DIAGNOSIS — I4892 Unspecified atrial flutter: Secondary | ICD-10-CM | POA: Diagnosis not present

## 2022-04-20 DIAGNOSIS — F17210 Nicotine dependence, cigarettes, uncomplicated: Secondary | ICD-10-CM | POA: Diagnosis not present

## 2022-04-20 DIAGNOSIS — Z7729 Contact with and (suspected ) exposure to other hazardous substances: Secondary | ICD-10-CM | POA: Diagnosis not present

## 2022-05-04 ENCOUNTER — Other Ambulatory Visit: Payer: Self-pay | Admitting: Family Medicine

## 2022-05-09 ENCOUNTER — Ambulatory Visit (INDEPENDENT_AMBULATORY_CARE_PROVIDER_SITE_OTHER): Payer: Medicare PPO

## 2022-05-09 DIAGNOSIS — I5042 Chronic combined systolic (congestive) and diastolic (congestive) heart failure: Secondary | ICD-10-CM

## 2022-05-09 DIAGNOSIS — Z95810 Presence of automatic (implantable) cardiac defibrillator: Secondary | ICD-10-CM | POA: Diagnosis not present

## 2022-05-12 ENCOUNTER — Telehealth: Payer: Self-pay

## 2022-05-12 NOTE — Telephone Encounter (Signed)
Remote ICM transmission received.  Attempted call to patient regarding ICM remote transmission and left detailed message per DPR.  Advised to return call for any fluid symptoms or questions. Next ICM remote transmission scheduled 06/13/2022.

## 2022-05-12 NOTE — Progress Notes (Signed)
EPIC Encounter for ICM Monitoring  Patient Name: Russell Werner is a 67 y.o. male Date: 05/12/2022 Primary Care Physican: Elby Beck, DO Primary Cardiologist: [place] Electrophysiologist: Lovena Le 01/18/2022 Office Weight 315 lbs                                                          Attempted call to patient and unable to reach.  Left detailed message per DPR regarding transmission. Transmission reviewed.    CorVue thoracic impedance normal but was suggesting possible fluid accumulation from 12/30-1/5.   Prescribed: Spironolactone 25 mg take 1 tablet daily     Labs:   12/06/2021 Creatinine 0.92, BUN 18, Potassium 3.8, Sodium 140, GFR >60 10/11/2021 Creatinine 0.86, BUN 12, Potassium 4.7, Sodium 145, GFR 94 A complete set of results can be found in Results Review.   Recommendations:  Left voice mail with ICM number and encouraged to call if experiencing any fluid symptoms.   Follow-up plan: ICM clinic phone appointment on 06/13/2022.   91 day device clinic remote transmission 07/11/2022.     EP/Cardiology Office Visits:  Recall 01/13/2023 with Dr Lovena Le     Copy of ICM check sent to Dr. Lovena Le.   3 month ICM trend: 05/09/2022.    12-14 Month ICM trend:     Rosalene Billings, RN 05/12/2022 10:36 AM

## 2022-05-20 NOTE — Progress Notes (Signed)
Remote ICD transmission.   

## 2022-06-09 ENCOUNTER — Encounter (HOSPITAL_COMMUNITY): Payer: Self-pay | Admitting: *Deleted

## 2022-06-09 DIAGNOSIS — F419 Anxiety disorder, unspecified: Secondary | ICD-10-CM | POA: Diagnosis not present

## 2022-06-09 DIAGNOSIS — I509 Heart failure, unspecified: Secondary | ICD-10-CM | POA: Diagnosis not present

## 2022-06-09 DIAGNOSIS — F1210 Cannabis abuse, uncomplicated: Secondary | ICD-10-CM | POA: Diagnosis not present

## 2022-06-09 DIAGNOSIS — G4733 Obstructive sleep apnea (adult) (pediatric): Secondary | ICD-10-CM | POA: Diagnosis not present

## 2022-06-13 ENCOUNTER — Ambulatory Visit: Payer: Medicare PPO | Attending: Internal Medicine

## 2022-06-13 DIAGNOSIS — Z95810 Presence of automatic (implantable) cardiac defibrillator: Secondary | ICD-10-CM

## 2022-06-13 DIAGNOSIS — I5042 Chronic combined systolic (congestive) and diastolic (congestive) heart failure: Secondary | ICD-10-CM

## 2022-06-14 NOTE — Progress Notes (Signed)
EPIC Encounter for ICM Monitoring  Patient Name: Russell Werner is a 67 y.o. male Date: 06/14/2022 Primary Care Physican: Elby Beck, DO Primary Cardiologist: In [place] Electrophysiologist: Lovena Le 01/18/2022 Office Weight 315 lbs  06/14/2022 Weight: 300 lbs                                                         Spoke with patient and heart failure questions reviewed.  Transmission results reviewed.  Pt reports SOB and can tell he has some fluid.  He reports maintaining low salt diet and limiting fluid intake to 64 oz daily.   CorVue thoracic impedance suggesting possible fluid accumulation starting 2/4 and returning close to baseline.   Prescribed: Spironolactone 25 mg take 1 tablet daily     Labs:   12/06/2021 Creatinine 0.92, BUN 18, Potassium 3.8, Sodium 140, GFR >60 10/11/2021 Creatinine 0.86, BUN 12, Potassium 4.7, Sodium 145, GFR 94 A complete set of results can be found in Results Review.   Recommendations:  Recommendation to limit salt intake to 2000 mg daily and fluid intake to 64 oz daily.  Encouraged to call if experiencing any fluid symptoms.    Follow-up plan: ICM clinic phone appointment on 06/21/2022 to recheck fluid levels.   91 day device clinic remote transmission 07/11/2022.     EP/Cardiology Office Visits:  Recall 01/13/2023 with Dr Lovena Le     Copy of ICM check sent to Dr. Lovena Le.   3 month ICM trend: 06/13/2022.    12-14 Month ICM trend:     Rosalene Billings, RN 06/14/2022 4:41 PM

## 2022-06-21 ENCOUNTER — Ambulatory Visit: Payer: Medicare PPO | Attending: Internal Medicine

## 2022-06-21 DIAGNOSIS — I5042 Chronic combined systolic (congestive) and diastolic (congestive) heart failure: Secondary | ICD-10-CM

## 2022-06-21 DIAGNOSIS — Z95810 Presence of automatic (implantable) cardiac defibrillator: Secondary | ICD-10-CM

## 2022-06-21 NOTE — Progress Notes (Signed)
EPIC Encounter for ICM Monitoring  Patient Name: Russell Werner is a 67 y.o. male Date: 06/21/2022 Primary Care Physican: Elby Beck, DO Primary Cardiologist: In [place] Electrophysiologist: Lovena Le 01/18/2022 Office Weight 315 lbs  06/14/2022 Weight: 300 lbs                                                         Spoke with patient and heart failure questions reviewed.  Transmission results reviewed.  Pt reports SOB has resolved.    CorVue thoracic impedance suggesting fluid levels returned to normal.   Prescribed: Spironolactone 25 mg take 1 tablet daily     Labs:   12/06/2021 Creatinine 0.92, BUN 18, Potassium 3.8, Sodium 140, GFR >60 10/11/2021 Creatinine 0.86, BUN 12, Potassium 4.7, Sodium 145, GFR 94 A complete set of results can be found in Results Review.   Recommendations:  No changes and encouraged to call if experiencing any fluid symptoms.   Follow-up plan: ICM clinic phone appointment 07/18/2022.   91 day device clinic remote transmission 07/11/2022.     EP/Cardiology Office Visits:  Recall 01/13/2023 with Dr Lovena Le     Copy of ICM check sent to Dr. Lovena Le.   3 month ICM trend: 06/21/2022.    12-14 Month ICM trend:     Rosalene Billings, RN 06/21/2022 9:14 AM

## 2022-06-21 NOTE — Patient Instructions (Incomplete)
[name] , Thank you for taking time to come for your Medicare Wellness Visit. I appreciate your ongoing commitment to your health goals. Please review the following plan we discussed and let me know if I can assist you in the future.   These are the goals we discussed:  Goals      Weight (lb) < 200 lb (90.7 kg)     Become more active and lose weight.        This is a list of the screening recommended for you and due dates:  Health Maintenance  Topic Date Due   COVID-19 Vaccine (1) Never done   Pneumonia Vaccine (1 of 2 - PCV) Never done   DTaP/Tdap/Td vaccine (1 - Tdap) Never done   Zoster (Shingles) Vaccine (1 of 2) Never done   Screening for Lung Cancer  Never done   Flu Shot  11/30/2021   Medicare Annual Wellness Visit  06/21/2022   Colon Cancer Screening  10/24/2028   Hepatitis C Screening: USPSTF Recommendation to screen - Ages 18-79 yo.  Completed   HPV Vaccine  Aged Out    Advanced directives: ***  Conditions/risks identified: Aim for 30 minutes of exercise or brisk walking, 6-8 glasses of water, and 5 servings of fruits and vegetables each day.   Next appointment: Follow up in one year for your annual wellness visit.   Preventive Care 69 Years and Older, Male  Preventive care refers to lifestyle choices and visits with your health care provider that can promote health and wellness. What does preventive care include? A yearly physical exam. This is also called an annual well check. Dental exams once or twice a year. Routine eye exams. Ask your health care provider how often you should have your eyes checked. Personal lifestyle choices, including: Daily care of your teeth and gums. Regular physical activity. Eating a healthy diet. Avoiding tobacco and drug use. Limiting alcohol use. Practicing safe sex. Taking low doses of aspirin every day. Taking vitamin and mineral supplements as recommended by your health care provider. What happens during an annual well  check? The services and screenings done by your health care provider during your annual well check will depend on your age, overall health, lifestyle risk factors, and family history of disease. Counseling  Your health care provider may ask you questions about your: Alcohol use. Tobacco use. Drug use. Emotional well-being. Home and relationship well-being. Sexual activity. Eating habits. History of falls. Memory and ability to understand (cognition). Work and work Statistician. Screening  You may have the following tests or measurements: Height, weight, and BMI. Blood pressure. Lipid and cholesterol levels. These may be checked every 5 years, or more frequently if you are over 66 years old. Skin check. Lung cancer screening. You may have this screening every year starting at age 69 if you have a 30-pack-year history of smoking and currently smoke or have quit within the past 15 years. Fecal occult blood test (FOBT) of the stool. You may have this test every year starting at age 1. Flexible sigmoidoscopy or colonoscopy. You may have a sigmoidoscopy every 5 years or a colonoscopy every 10 years starting at age 56. Prostate cancer screening. Recommendations will vary depending on your family history and other risks. Hepatitis C blood test. Hepatitis B blood test. Sexually transmitted disease (STD) testing. Diabetes screening. This is done by checking your blood sugar (glucose) after you have not eaten for a while (fasting). You may have this done every 1-3 years.  Abdominal aortic aneurysm (AAA) screening. You may need this if you are a current or former smoker. Osteoporosis. You may be screened starting at age 44 if you are at high risk. Talk with your health care provider about your test results, treatment options, and if necessary, the need for more tests. Vaccines  Your health care provider may recommend certain vaccines, such as: Influenza vaccine. This is recommended every  year. Tetanus, diphtheria, and acellular pertussis (Tdap, Td) vaccine. You may need a Td booster every 10 years. Zoster vaccine. You may need this after age 69. Pneumococcal 13-valent conjugate (PCV13) vaccine. One dose is recommended after age 69. Pneumococcal polysaccharide (PPSV23) vaccine. One dose is recommended after age 69. Talk to your health care provider about which screenings and vaccines you need and how often you need them. This information is not intended to replace advice given to you by your health care provider. Make sure you discuss any questions you have with your health care provider. Document Released: 05/15/2015 Document Revised: 01/06/2016 Document Reviewed: 02/17/2015 Elsevier Interactive Patient Education  2017 [employer] Prevention in the Home Falls can cause injuries. They can happen to people of all ages. There are many things you can do to make your home safe and to help prevent falls. What can I do on the outside of my home? Regularly fix the edges of walkways and driveways and fix any cracks. Remove anything that might make you trip as you walk through a door, such as a raised step or threshold. Trim any bushes or trees on the path to your home. Use bright outdoor lighting. Clear any walking paths of anything that might make someone trip, such as rocks or tools. Regularly check to see if handrails are loose or broken. Make sure that both sides of any steps have handrails. Any raised decks and porches should have guardrails on the edges. Have any leaves, snow, or ice cleared regularly. Use sand or salt on walking paths during winter. Clean up any spills in your garage right away. This includes oil or grease spills. What can I do in the bathroom? Use night lights. Install grab bars by the toilet and in the tub and shower. Do not use towel bars as grab bars. Use non-skid mats or decals in the tub or shower. If you need to sit down in the shower, use a  plastic, non-slip stool. Keep the floor dry. Clean up any water that spills on the floor as soon as it happens. Remove soap buildup in the tub or shower regularly. Attach bath mats securely with double-sided non-slip rug tape. Do not have throw rugs and other things on the floor that can make you trip. What can I do in the bedroom? Use night lights. Make sure that you have a light by your bed that is easy to reach. Do not use any sheets or blankets that are too big for your bed. They should not hang down onto the floor. Have a firm chair that has side arms. You can use this for support while you get dressed. Do not have throw rugs and other things on the floor that can make you trip. What can I do in the kitchen? Clean up any spills right away. Avoid walking on wet floors. Keep items that you use a lot in easy-to-reach places. If you need to reach something above you, use a strong step stool that has a grab bar. Keep electrical cords out of the way. Do not  use floor polish or wax that makes floors slippery. If you must use wax, use non-skid floor wax. Do not have throw rugs and other things on the floor that can make you trip. What can I do with my stairs? Do not leave any items on the stairs. Make sure that there are handrails on both sides of the stairs and use them. Fix handrails that are broken or loose. Make sure that handrails are as long as the stairways. Check any carpeting to make sure that it is firmly attached to the stairs. Fix any carpet that is loose or worn. Avoid having throw rugs at the top or bottom of the stairs. If you do have throw rugs, attach them to the floor with carpet tape. Make sure that you have a light switch at the top of the stairs and the bottom of the stairs. If you do not have them, ask someone to add them for you. What else can I do to help prevent falls? Wear shoes that: Do not have high heels. Have rubber bottoms. Are comfortable and fit you  well. Are closed at the toe. Do not wear sandals. If you use a stepladder: Make sure that it is fully opened. Do not climb a closed stepladder. Make sure that both sides of the stepladder are locked into place. Ask someone to hold it for you, if possible. Clearly mark and make sure that you can see: Any grab bars or handrails. First and last steps. Where the edge of each step is. Use tools that help you move around (mobility aids) if they are needed. These include: Canes. Walkers. Scooters. Crutches. Turn on the lights when you go into a dark area. Replace any light bulbs as soon as they burn out. Set up your furniture so you have a clear path. Avoid moving your furniture around. If any of your floors are uneven, fix them. If there are any pets around you, be aware of where they are. Review your medicines with your doctor. Some medicines can make you feel dizzy. This can increase your chance of falling. Ask your doctor what other things that you can do to help prevent falls. This information is not intended to replace advice given to you by your health care provider. Make sure you discuss any questions you have with your health care provider. Document Released: 02/12/2009 Document Revised: 09/24/2015 Document Reviewed: 05/23/2014 Elsevier Interactive Patient Education  2017 [employer].

## 2022-06-21 NOTE — Progress Notes (Deleted)
Subjective:   Russell Werner is a 67 y.o. male who presents for Medicare Annual/Subsequent preventive examination.  Review of Systems    ***       Objective:    There were no vitals filed for this visit. There is no height or weight on file to calculate BMI.     12/06/2021    5:51 AM 06/21/2021   10:51 AM 10/18/2017    6:42 AM 10/02/2017    9:29 AM 05/03/2017    6:41 PM  Advanced Directives  Does Patient Have a Medical Advance Directive? No No No No No  Would patient like information on creating a medical advance directive? No - Patient declined No - Patient declined No - Patient declined No - Patient declined No - Patient declined    Current Medications (verified) Outpatient Encounter Medications as of 06/22/2022  Medication Sig   Ascorbic Acid (VITAMIN C) 1000 MG tablet Take 2,000 mg by mouth daily.   bisoprolol (ZEBETA) 5 MG tablet TAKE 1 TABLET EVERY DAY   clotrimazole-betamethasone (LOTRISONE) cream Apply 1 Application topically.   finasteride (PROSCAR) 5 MG tablet TAKE 1 TABLET EVERY DAY   OZEMPIC, 0.25 OR 0.5 MG/DOSE, 2 MG/3ML SOPN Inject 0.5 mg into the skin once a week.   rivaroxaban (XARELTO) 20 MG TABS tablet Take 1 tablet (20 mg total) by mouth daily with supper. (Patient not taking: Reported on 01/18/2022)   rosuvastatin (CRESTOR) 20 MG tablet TAKE 1 TABLET EVERY DAY   sacubitril-valsartan (ENTRESTO) 24-26 MG Take 1 tablet by mouth 2 (two) times daily.   spironolactone (ALDACTONE) 25 MG tablet TAKE 1 TABLET EVERY DAY   VITAMIN D PO Take 1 tablet by mouth daily.   No facility-administered encounter medications on file as of 06/22/2022.    Allergies (verified) Patient has no known allergies.   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   Arthritis    "fingers" (05/03/2017)   CHF (congestive heart failure) (HCC)    COPD (chronic obstructive pulmonary disease) ([place])    Coronary artery disease     nonobstructive mid RCA by 2009 cath at [facility] in [place]   Enlarged prostate    High cholesterol    Hypertension    Melanoma of back ([place]) 2013   Meniscal injury    Myocardial infarction ([place]) 1998   mild   OSA on CPAP    "severe" (05/03/2017)   Right knee meniscal tear    Past Surgical History:  Procedure Laterality Date   A-FLUTTER ABLATION N/A 12/06/2021   Procedure: A-FLUTTER ABLATION;  Surgeon: Evans Lance, MD;  Location: [facility];  Service: Cardiovascular;  Laterality: N/A;   CARDIAC CATHETERIZATION  X 2   CARDIAC DEFIBRILLATOR PLACEMENT  05/03/2017   CYST REMOVAL LEG Bilateral 2013   HERNIA REPAIR     ICD IMPLANT N/A 05/03/2017   Procedure: ICD IMPLANT;  Surgeon: Thompson Grayer, MD;  Location: [facility];  Service: Cardiovascular;  Laterality: N/A;   KNEE ARTHROSCOPY WITH MEDIAL MENISECTOMY Right 10/18/2017   Procedure: RIGHT KNEE ARTHROSCOPY WITH PARTIAL MEDIAL MENISCECTOMY;  Surgeon: Leandrew Koyanagi, MD;  Location: [facility];  Service: Orthopedics;  Laterality: Right;   KNEE CARTILAGE SURGERY Left 1972   LAPAROSCOPIC CHOLECYSTECTOMY  2015   LAPAROSCOPIC GASTRIC BAND REMOVAL WITH LAPAROSCOPIC GASTRIC SLEEVE RESECTION  11/05/2014   MELANOMA EXCISION  0000000   "back"   UMBILICAL HERNIA REPAIR  2014  Family History  Problem Relation Age of Onset   Early death Mother    Cancer Sister        breast   HIV Brother    Cancer Maternal Grandmother        breast   Stroke Maternal Grandfather    Cancer Sister        melanoma   Social History   Socioeconomic History   Marital status: Divorced    Spouse name: Not on file   Number of children: Not on file   Years of education: Not on file   Highest education level: Not on file  Occupational History   Not on file  Tobacco Use   Smoking status: Former    Packs/day: 1.00    Years: 47.00    Total pack years: 47.00    Types: Cigarettes    Start date: 06/24/1967    Quit date: 05/02/2013    Years  since quitting: 9.1   Smokeless tobacco: Never  Vaping Use   Vaping Use: Every day  Substance and Sexual Activity   Alcohol use: Yes    Comment:  "1-2 glasses of wine/month"   Drug use: No   Sexual activity: Yes  Other Topics Concern   Not on file  Social History Narrative   Not on file   Social Determinants of Health   Financial Resource Strain: Not on file  Food Insecurity: Food Insecurity Present (06/23/2021)   Hunger Vital Sign    Worried About Running Out of Food in the Last Year: Sometimes true    Ran Out of Food in the Last Year: Sometimes true  Transportation Needs: No Transportation Needs (06/21/2021)   PRAPARE - Hydrologist (Medical): No    Lack of Transportation (Non-Medical): No  Physical Activity: Insufficiently Active (06/21/2021)   Exercise Vital Sign    Days of Exercise per Week: 1 day    Minutes of Exercise per Session: 10 min  Stress: Stress Concern Present (06/23/2021)   [place]    Feeling of Stress : Rather much  Social Connections: Moderately Integrated (06/21/2021)   Social Connection and Isolation Panel [NHANES]    Frequency of Communication with Friends and Family: Twice a week    Frequency of Social Gatherings with Friends and Family: Once a week    Attends Religious Services: 1 to 4 times per year    Active Member of [employer] or Organizations: Yes    Attends Archivist Meetings: 1 to 4 times per year    Marital Status: Divorced    Tobacco Counseling Counseling given: Not Answered   Clinical Intake:              How often do you need to have someone help you when you read instructions, pamphlets, or other written materials from your doctor or pharmacy?: (P) 1 - Never  Diabetic? No          Activities of Daily Living    06/18/2022    9:23 AM 12/06/2021    5:49 AM  In your present state of health, do you have any difficulty  performing the following activities:  Hearing? 0 0  Vision? 0 0  Difficulty concentrating or making decisions? 0 0  Walking or climbing stairs? 0 1  Dressing or bathing? 0 0  Doing errands, shopping? 0   Preparing Food and eating ? N   Using the Toilet? N  In the past six months, have you accidently leaked urine? N   Do you have problems with loss of bowel control? N   Managing your Medications? N   Managing your Finances? N   Housekeeping or managing your Housekeeping? N     Patient Care Team: Elby Beck, DO as PCP - General (Family Medicine) Thompson Grayer, MD (Inactive) as PCP - Electrophysiology (Cardiology)  Indicate any recent Medical Services you may have received from other than [facility] providers in the past year (date may be approximate).     Assessment:   This is a routine wellness examination for [place].  Hearing/Vision screen No results found.  Dietary issues and exercise activities discussed:     Goals Addressed   None    Depression Screen    07/05/2021    9:02 AM 06/21/2021   10:59 AM 06/21/2021   10:46 AM 03/22/2021    9:24 AM 09/24/2020    8:34 AM 02/26/2020   12:59 PM 01/27/2020    7:59 AM  PHQ 2/9 Scores  PHQ - 2 Score 2 0 0 0 0 0 0  PHQ- 9 Score 7   2 0      Fall Risk    06/18/2022    9:23 AM 07/05/2021    9:02 AM 06/17/2021    8:44 AM 03/22/2021    9:24 AM 09/24/2020    8:33 AM  Fall Risk   Falls in the past year? 0 0 0 0 1  Number falls in past yr:   0  0  Injury with Fall?   0  1  Risk for fall due to :   No Fall Risks  History of fall(s)  Follow up   Falls prevention discussed  Education provided    FALL RISK PREVENTION PERTAINING TO THE HOME:  Any stairs in or around the home? {YES/NO:21197} If so, are there any without handrails? {YES/NO:21197} Home free of loose throw rugs in walkways, pet beds, electrical cords, etc? {YES/NO:21197} Adequate lighting in your home to reduce risk of falls? {YES/NO:21197}  ASSISTIVE DEVICES UTILIZED  TO PREVENT FALLS:  Life alert? {YES/NO:21197} Use of a cane, walker or w/c? {YES/NO:21197} Grab bars in the bathroom? {YES/NO:21197} Shower chair or bench in shower? {YES/NO:21197} Elevated toilet seat or a handicapped toilet? {YES/NO:21197}  TIMED UP AND GO:  Was the test performed? No . Telephonic visit   Cognitive Function:        06/21/2021   10:53 AM  6CIT Screen  What Year? 0 points  What month? 0 points  What time? 0 points  Count back from 20 0 points  Months in reverse 0 points  Repeat phrase 0 points  Total Score 0 points    Immunizations Immunization History  Administered Date(s) Administered   Influenza-Unspecified 12/19/2016    {TDAP status:2101805}  {Flu Vaccine status:2101806}  {Pneumococcal vaccine status:2101807}  {Covid-19 vaccine status:2101808}  Qualifies for Shingles Vaccine? {YES/NO:21197}  Zostavax completed {YES/NO:21197}  {Shingrix Completed?:2101804}  Screening Tests Health Maintenance  Topic Date Due   COVID-19 Vaccine (1) Never done   Pneumonia Vaccine 4+ Years old (1 of 2 - PCV) Never done   DTaP/Tdap/Td (1 - Tdap) Never done   Zoster Vaccines- Shingrix (1 of 2) Never done   Lung Cancer Screening  Never done   INFLUENZA VACCINE  11/30/2021   Medicare Annual Wellness (AWV)  06/21/2022   COLONOSCOPY (Pts 45-51yr Insurance coverage will need to be confirmed)  10/24/2028   Hepatitis C Screening  Completed   HPV VACCINES  Aged Out    Health Maintenance  Health Maintenance Due  Topic Date Due   COVID-19 Vaccine (1) Never done   Pneumonia Vaccine 31+ Years old (1 of 2 - PCV) Never done   DTaP/Tdap/Td (1 - Tdap) Never done   Zoster Vaccines- Shingrix (1 of 2) Never done   Lung Cancer Screening  Never done   INFLUENZA VACCINE  11/30/2021   Medicare Annual Wellness (AWV)  06/21/2022    Colorectal cancer screening: Type of screening: Colonoscopy. Completed 10/25/18. Repeat every 10 years  Lung Cancer Screening: (Low Dose CT  Chest recommended if Age 14-80 years, 30 pack-year currently smoking OR have quit w/in 15years.) does qualify.   Lung Cancer Screening Referral: ***  Additional Screening:  Hepatitis C Screening: does qualify; Completed 04/13/17  Vision Screening: Recommended annual ophthalmology exams for early detection of glaucoma and other disorders of the eye. Is the patient up to date with their annual eye exam?  {YES/NO:21197} Who is the provider or what is the name of the office in which the patient attends annual eye exams? *** If pt is not established with a provider, would they like to be referred to a provider to establish care? {YES/NO:21197}.   Dental Screening: Recommended annual dental exams for proper oral hygiene  Community Resource Referral / Chronic Care Management: CRR required this visit?  {YES/NO:21197}  CCM required this visit?  {YES/NO:21197}     Plan:     I have personally reviewed and noted the following in the patient's chart:   Medical and social history Use of alcohol, tobacco or illicit drugs  Current medications and supplements including opioid prescriptions. {Opioid [phone]} Functional ability and status Nutritional status Physical activity Advanced directives List of other physicians Hospitalizations, surgeries, and ER visits in previous 12 months Vitals Screenings to include cognitive, depression, and falls Referrals and appointments  In addition, I have reviewed and discussed with patient certain preventive protocols, quality metrics, and best practice recommendations. A written personalized care plan for preventive services as well as general preventive health recommendations were provided to patient.     Denman George [place], [place]   [id number]   Due to this being a virtual visit, the after visit summary with patients personalized plan was offered to patient via mail or my-chart. ***Patient declined at this time./ Patient would  like to access on my-chart/ per request, patient was mailed a copy of AVS./ Patient preferred to pick up at office at next visit   Nurse Notes: ***

## 2022-07-07 ENCOUNTER — Telehealth: Payer: Self-pay | Admitting: Internal Medicine

## 2022-07-07 NOTE — Telephone Encounter (Signed)
Pt states it is time for his renewal with [facility] patient assistance. Patient assistance forms printed and ready for pt to complete. One bottle of Entresto 24-26 mg samples placed at front desk for pick up. Lot # [id number], Exp: April 2026. Number 28 tablets.

## 2022-07-07 NOTE — Telephone Encounter (Signed)
*  STAT* If patient is at the pharmacy, call can be transferred to refill team.   1. Which medications need to be refilled? (please list name of each medication and dose if known)   sacubitril-valsartan (ENTRESTO) 24-26 MG   2. Which pharmacy/location (including street and city if local pharmacy) is medication to be sent to?     [facility] [employer]  3. Do they need a 30 day or 90 day supply?    90 day  Patient stated he is completely out of this medication and he stated he will need to complete application again for this medication.  Patient stated he can stop by office to complete his part of the application.

## 2022-07-11 ENCOUNTER — Ambulatory Visit (INDEPENDENT_AMBULATORY_CARE_PROVIDER_SITE_OTHER): Payer: Medicare PPO

## 2022-07-11 DIAGNOSIS — I4891 Unspecified atrial fibrillation: Secondary | ICD-10-CM

## 2022-07-13 LAB — CUP PACEART REMOTE DEVICE CHECK
Battery Remaining Longevity: 60 mo
Battery Remaining Percentage: 55 %
Battery Voltage: 2.96 V
Brady Statistic RV Percent Paced: 1 %
Date Time Interrogation Session: 20240311030015
HighPow Impedance: 75 Ohm
HighPow Impedance: 75 Ohm
Implantable Lead Connection Status: 753985
Implantable Lead Implant Date: 20190102
Implantable Lead Location: 753860
Implantable Pulse Generator Implant Date: 20190102
Lead Channel Impedance Value: 650 Ohm
Lead Channel Pacing Threshold Amplitude: 0.5 V
Lead Channel Pacing Threshold Pulse Width: 0.5 ms
Lead Channel Sensing Intrinsic Amplitude: 11.5 mV
Lead Channel Setting Pacing Amplitude: 2.5 V
Lead Channel Setting Pacing Pulse Width: 0.5 ms
Lead Channel Setting Sensing Sensitivity: 0.5 mV
Pulse Gen Serial Number: 9786935
Zone Setting Status: 755011

## 2022-07-14 ENCOUNTER — Telehealth: Payer: Self-pay | Admitting: Internal Medicine

## 2022-07-14 NOTE — Telephone Encounter (Signed)
Pt c/o medication issue:  1. Name of Medication: sacubitril-valsartan (ENTRESTO) 24-26 MG   2. How are you currently taking this medication (dosage and times per day)?   3. Are you having a reaction (difficulty breathing--STAT)?   4. What is your medication issue? Pharmacy is calling stating that prescription on this medication looks stamped and they are requesting call back for clarification on this med.

## 2022-07-14 NOTE — Telephone Encounter (Signed)
Returned call to pharmacy. Clarification of med given.

## 2022-07-18 ENCOUNTER — Ambulatory Visit: Payer: Medicare PPO | Attending: Internal Medicine

## 2022-07-18 DIAGNOSIS — Z95810 Presence of automatic (implantable) cardiac defibrillator: Secondary | ICD-10-CM | POA: Diagnosis not present

## 2022-07-18 DIAGNOSIS — I5042 Chronic combined systolic (congestive) and diastolic (congestive) heart failure: Secondary | ICD-10-CM | POA: Diagnosis not present

## 2022-07-20 DIAGNOSIS — I509 Heart failure, unspecified: Secondary | ICD-10-CM | POA: Diagnosis not present

## 2022-07-20 DIAGNOSIS — G4733 Obstructive sleep apnea (adult) (pediatric): Secondary | ICD-10-CM | POA: Diagnosis not present

## 2022-07-20 DIAGNOSIS — I10 Essential (primary) hypertension: Secondary | ICD-10-CM | POA: Diagnosis not present

## 2022-07-20 DIAGNOSIS — Z0000 Encounter for general adult medical examination without abnormal findings: Secondary | ICD-10-CM | POA: Diagnosis not present

## 2022-07-22 NOTE — Progress Notes (Signed)
EPIC Encounter for ICM Monitoring  Patient Name: Russell Werner is a 67 y.o. male Date: 07/22/2022 Primary Care Physican: Elby Beck, DO Primary Cardiologist: In [place] Electrophysiologist: Lovena Le 01/18/2022 Office Weight 315 lbs  06/14/2022 Weight: 300 lbs                                                         Spoke with patient and heart failure questions reviewed.  Transmission results reviewed.  Pt asymptomatic for fluid accumulation.  Reports feeling well at this time and voices no complaints.     CorVue thoracic impedance normal but was suggesting possible fluid accumulation from 3/2-3/8 and 3/11-3/14   Prescribed: Spironolactone 25 mg take 1 tablet daily     Labs:   12/06/2021 Creatinine 0.92, BUN 18, Potassium 3.8, Sodium 140, GFR >60 10/11/2021 Creatinine 0.86, BUN 12, Potassium 4.7, Sodium 145, GFR 94 A complete set of results can be found in Results Review.   Recommendations:  No changes and encouraged to call if experiencing any fluid symptoms.   Follow-up plan: ICM clinic phone appointment 08/22/2022.   91 day device clinic remote transmission 10/10/2022.     EP/Cardiology Office Visits:  Recall 01/13/2023 with Dr Lovena Le     Copy of ICM check sent to Dr. Lovena Le.   3 month ICM trend: 07/18/2022.    12-14 Month ICM trend:     Rosalene Billings, RN 07/22/2022 3:12 PM

## 2022-08-05 DIAGNOSIS — Z122 Encounter for screening for malignant neoplasm of respiratory organs: Secondary | ICD-10-CM | POA: Diagnosis not present

## 2022-08-05 DIAGNOSIS — Z87891 Personal history of nicotine dependence: Secondary | ICD-10-CM | POA: Diagnosis not present

## 2022-08-05 DIAGNOSIS — Z136 Encounter for screening for cardiovascular disorders: Secondary | ICD-10-CM | POA: Diagnosis not present

## 2022-08-22 ENCOUNTER — Ambulatory Visit: Payer: Medicare PPO | Attending: Internal Medicine

## 2022-08-22 DIAGNOSIS — Z95810 Presence of automatic (implantable) cardiac defibrillator: Secondary | ICD-10-CM

## 2022-08-22 DIAGNOSIS — I5042 Chronic combined systolic (congestive) and diastolic (congestive) heart failure: Secondary | ICD-10-CM

## 2022-08-24 NOTE — Progress Notes (Signed)
Remote ICD transmission.   

## 2022-08-26 NOTE — Progress Notes (Signed)
EPIC Encounter for ICM Monitoring  Patient Name: Russell Werner is a 67 y.o. male Date: 08/26/2022 Primary Care Physican: Achilles Dunk, DO Primary Cardiologist: In [place] Electrophysiologist: Ladona Ridgel 01/18/2022 Office Weight 315 lbs  06/14/2022 Weight: 300 lbs                                                         Spoke with patient and heart failure questions reviewed.  Transmission results reviewed.  Pt asymptomatic for fluid accumulation.  Reports feeling well at this time and voices no complaints.     CorVue thoracic impedance normal but was suggesting possible fluid accumulation from 4/1-4/5.   Prescribed: Spironolactone 25 mg take 1 tablet daily     Labs:   12/06/2021 Creatinine 0.92, BUN 18, Potassium 3.8, Sodium 140, GFR >60 10/11/2021 Creatinine 0.86, BUN 12, Potassium 4.7, Sodium 145, GFR 94 A complete set of results can be found in Results Review.   Recommendations:  No changes and encouraged to call if experiencing any fluid symptoms.   Follow-up plan: ICM clinic phone appointment 09/27/2022.   91 day device clinic remote transmission 10/10/2022.     EP/Cardiology Office Visits:  Recall 01/13/2023 with Dr Ladona Ridgel     Copy of ICM check sent to Dr. Ladona Ridgel.   3 month ICM trend: 08/22/2022.    12-14 Month ICM trend:     Karie Soda, RN 08/26/2022 3:03 PM

## 2022-09-27 ENCOUNTER — Ambulatory Visit: Payer: Medicare PPO | Attending: Internal Medicine

## 2022-09-27 DIAGNOSIS — I5042 Chronic combined systolic (congestive) and diastolic (congestive) heart failure: Secondary | ICD-10-CM | POA: Diagnosis not present

## 2022-09-27 DIAGNOSIS — Z95810 Presence of automatic (implantable) cardiac defibrillator: Secondary | ICD-10-CM

## 2022-09-30 NOTE — Progress Notes (Signed)
EPIC Encounter for ICM Monitoring  Patient Name: Russell Werner is a 67 y.o. male Date: 09/30/2022 Primary Care Physican: Achilles Dunk, DO Primary Cardiologist: In [place] Electrophysiologist: Ladona Ridgel 01/18/2022 Office Weight 315 lbs  09/30/2022 Weight: 387 lbs                                                         Spoke with patient and heart failure questions reviewed.  Transmission results reviewed.  Pt asymptomatic for fluid accumulation.  Reports feeling well at this time and voices no complaints.  He is drinking more fluids since he has been working outside and sweating.  He reports following low salt diet.   CorVue thoracic impedance normal but was suggesting possible fluid accumulation from 4/21-5/3 and 5/18-5/27.   Prescribed: Spironolactone 25 mg take 1 tablet daily     Labs:   12/06/2021 Creatinine 0.92, BUN 18, Potassium 3.8, Sodium 140, GFR >60 10/11/2021 Creatinine 0.86, BUN 12, Potassium 4.7, Sodium 145, GFR 94 A complete set of results can be found in Results Review.   Recommendations:  Recommendation to limit salt intake to 2000 mg daily and fluid intake to 64 oz daily.  Encouraged to call if experiencing any fluid symptoms.    Follow-up plan: ICM clinic phone appointment 10/31/2022.   91 day device clinic remote transmission 10/10/2022.     EP/Cardiology Office Visits:  Recall 01/13/2023 with Dr Ladona Ridgel     Copy of ICM check sent to Dr. Ladona Ridgel.   3 month ICM trend: 09/28/2022.    12-14 Month ICM trend:     Karie Soda, RN 09/30/2022 3:18 PM

## 2022-10-10 ENCOUNTER — Ambulatory Visit (INDEPENDENT_AMBULATORY_CARE_PROVIDER_SITE_OTHER): Payer: Medicare PPO

## 2022-10-10 DIAGNOSIS — I5042 Chronic combined systolic (congestive) and diastolic (congestive) heart failure: Secondary | ICD-10-CM | POA: Diagnosis not present

## 2022-10-11 LAB — CUP PACEART REMOTE DEVICE CHECK
Battery Remaining Longevity: 58 mo
Battery Remaining Percentage: 53 %
Battery Voltage: 2.96 V
Brady Statistic RV Percent Paced: 1 %
Date Time Interrogation Session: 20240610020029
HighPow Impedance: 69 Ohm
HighPow Impedance: 69 Ohm
Implantable Lead Connection Status: 753985
Implantable Lead Implant Date: 20190102
Implantable Lead Location: 753860
Implantable Pulse Generator Implant Date: 20190102
Lead Channel Impedance Value: 660 Ohm
Lead Channel Pacing Threshold Amplitude: 0.5 V
Lead Channel Pacing Threshold Pulse Width: 0.5 ms
Lead Channel Sensing Intrinsic Amplitude: 11.5 mV
Lead Channel Setting Pacing Amplitude: 2.5 V
Lead Channel Setting Pacing Pulse Width: 0.5 ms
Lead Channel Setting Sensing Sensitivity: 0.5 mV
Pulse Gen Serial Number: 9786935
Zone Setting Status: 755011

## 2022-11-02 NOTE — Progress Notes (Signed)
Remote ICD transmission.   

## 2022-11-10 ENCOUNTER — Telehealth: Payer: Self-pay

## 2022-11-10 NOTE — Telephone Encounter (Signed)
LMOVM for the to send missed ICM transmission.

## 2022-11-20 NOTE — Progress Notes (Signed)
No ICM remote transmission received for 11/07/2022 and next ICM transmission scheduled for 12/12/2022.

## 2022-12-05 ENCOUNTER — Ambulatory Visit: Payer: Medicare PPO | Attending: Internal Medicine

## 2022-12-05 DIAGNOSIS — Z95810 Presence of automatic (implantable) cardiac defibrillator: Secondary | ICD-10-CM | POA: Diagnosis not present

## 2022-12-05 DIAGNOSIS — I5042 Chronic combined systolic (congestive) and diastolic (congestive) heart failure: Secondary | ICD-10-CM | POA: Diagnosis not present

## 2022-12-08 NOTE — Progress Notes (Signed)
EPIC Encounter for ICM Monitoring  Patient Name: Russell Werner is a 67 y.o. male Date: 12/08/2022 Primary Care Physican: Achilles Dunk, DO Primary Cardiologist: In [place] Electrophysiologist: Ladona Ridgel 12/08/2022 Weight: 280 lbs                                                         Spoke with patient and heart failure questions reviewed.  Transmission results reviewed.  Pt asymptomatic for fluid accumulation.  Reports feeling well at this time and voices no complaints.     CorVue thoracic impedance normal but was suggesting possible fluid accumulation from 7/6-7/15 and 7/21-26.   Prescribed: Spironolactone 25 mg take 1 tablet daily     Labs:   12/06/2021 Creatinine 0.92, BUN 18, Potassium 3.8, Sodium 140, GFR >60 10/11/2021 Creatinine 0.86, BUN 12, Potassium 4.7, Sodium 145, GFR 94 A complete set of results can be found in Results Review.   Recommendations:  Recommendation to limit salt intake to 2000 mg daily and fluid intake to 64 oz daily.  Encouraged to call if experiencing any fluid symptoms.    Follow-up plan: ICM clinic phone appointment 01/10/2023.   91 day device clinic remote transmission 01/09/2023.     EP/Cardiology Office Visits:  Advised to call the office to make yearly appointment with Dr Ladona Ridgel.  Recall 01/13/2023 with Dr Ladona Ridgel     Copy of ICM check sent to Dr. Ladona Ridgel.   3 month ICM trend: 12/04/2022.    12-14 Month ICM trend:     Karie Soda, RN 12/08/2022 1:56 PM

## 2022-12-26 ENCOUNTER — Other Ambulatory Visit: Payer: Self-pay | Admitting: *Deleted

## 2022-12-26 DIAGNOSIS — I5022 Chronic systolic (congestive) heart failure: Secondary | ICD-10-CM

## 2022-12-26 DIAGNOSIS — I10 Essential (primary) hypertension: Secondary | ICD-10-CM

## 2022-12-26 MED ORDER — BISOPROLOL FUMARATE 5 MG PO TABS: 5.0000 mg | ORAL_TABLET | Freq: Every day | ORAL | 0 refills | Status: DC

## 2023-01-11 NOTE — Progress Notes (Signed)
No ICM remote transmission received for 01/09/2023 and next ICM transmission scheduled for 01/23/2023.

## 2023-01-31 NOTE — Progress Notes (Signed)
No ICM remote transmission received for 01/23/2023 and next ICM transmission scheduled for 02/13/2023.

## 2023-02-13 ENCOUNTER — Ambulatory Visit (INDEPENDENT_AMBULATORY_CARE_PROVIDER_SITE_OTHER): Payer: Medicare PPO

## 2023-02-13 DIAGNOSIS — Z95810 Presence of automatic (implantable) cardiac defibrillator: Secondary | ICD-10-CM

## 2023-02-13 DIAGNOSIS — I5042 Chronic combined systolic (congestive) and diastolic (congestive) heart failure: Secondary | ICD-10-CM | POA: Diagnosis not present

## 2023-02-16 ENCOUNTER — Telehealth: Payer: Self-pay

## 2023-02-16 NOTE — Telephone Encounter (Signed)
LMOVM for pt to send missed ICM transmission.  

## 2023-02-17 ENCOUNTER — Telehealth: Payer: Self-pay

## 2023-02-17 NOTE — Progress Notes (Signed)
EPIC Encounter for ICM Monitoring  Patient Name: Russell Werner is a 67 y.o. male Date: 02/17/2023 Primary Care Physican: Achilles Dunk, DO Primary Cardiologist: In [place] Electrophysiologist: Ladona Ridgel 12/08/2022 Weight: 280 lbs                                                         Attempted call to patient and unable to reach.  Left detailed message per DPR regarding transmission. Transmission reviewed.    CorVue thoracic impedance suggesting possible fluid accumulation starting 10/15 and also from 9/14-9/22.   Prescribed: Spironolactone 25 mg take 1 tablet daily     Labs:   12/06/2021 Creatinine 0.92, BUN 18, Potassium 3.8, Sodium 140, GFR >60 10/11/2021 Creatinine 0.86, BUN 12, Potassium 4.7, Sodium 145, GFR 94 A complete set of results can be found in Results Review.   Recommendations:  Left voice mail with ICM number and encouraged to call if experiencing any fluid symptoms.   Follow-up plan: ICM clinic phone appointment 02/27/2023 to recheck fluid levels.   91 day device clinic remote transmission 04/10/2023.     EP/Cardiology Office Visits:  Aware to call the office to make yearly appointment with Dr Ladona Ridgel.  Recall 01/13/2023 with Dr Ladona Ridgel     Copy of ICM check sent to Dr. Ladona Ridgel.   3 month ICM trend: 02/17/2023.    12-14 Month ICM trend:     Karie Soda, RN 02/17/2023 3:25 PM

## 2023-02-17 NOTE — Telephone Encounter (Signed)
Remote ICM transmission received.  Attempted call to patient regarding ICM remote transmission and left detailed message per DPR.  Left ICM phone number and advised to return call for any fluid symptoms or questions. Next ICM remote transmission scheduled 02/27/2023.

## 2023-03-08 NOTE — Progress Notes (Signed)
No ICM remote transmission received for 02/27/2023 and next ICM transmission scheduled for 03/20/2023.

## 2023-03-10 ENCOUNTER — Other Ambulatory Visit: Payer: Self-pay | Admitting: Internal Medicine

## 2023-03-10 DIAGNOSIS — I5022 Chronic systolic (congestive) heart failure: Secondary | ICD-10-CM

## 2023-03-10 DIAGNOSIS — I10 Essential (primary) hypertension: Secondary | ICD-10-CM

## 2023-03-20 NOTE — Progress Notes (Signed)
No ICM remote transmission received for 03/20/2023 and next ICM transmission scheduled for 04/11/2023.

## 2023-04-05 ENCOUNTER — Other Ambulatory Visit: Payer: Self-pay | Admitting: Internal Medicine

## 2023-04-05 DIAGNOSIS — I10 Essential (primary) hypertension: Secondary | ICD-10-CM

## 2023-04-05 DIAGNOSIS — I5022 Chronic systolic (congestive) heart failure: Secondary | ICD-10-CM

## 2023-04-10 ENCOUNTER — Ambulatory Visit (INDEPENDENT_AMBULATORY_CARE_PROVIDER_SITE_OTHER): Payer: Medicare PPO

## 2023-04-10 DIAGNOSIS — I4891 Unspecified atrial fibrillation: Secondary | ICD-10-CM | POA: Diagnosis not present

## 2023-04-11 ENCOUNTER — Ambulatory Visit: Payer: Medicare PPO | Attending: Internal Medicine

## 2023-04-11 DIAGNOSIS — I5042 Chronic combined systolic (congestive) and diastolic (congestive) heart failure: Secondary | ICD-10-CM

## 2023-04-11 DIAGNOSIS — Z95810 Presence of automatic (implantable) cardiac defibrillator: Secondary | ICD-10-CM

## 2023-04-12 LAB — CUP PACEART REMOTE DEVICE CHECK
Battery Remaining Longevity: 54 mo
Battery Remaining Percentage: 49 %
Battery Voltage: 2.96 V
Brady Statistic RV Percent Paced: 1 %
Date Time Interrogation Session: 20241211111909
HighPow Impedance: 75 Ohm
HighPow Impedance: 75 Ohm
Implantable Lead Connection Status: 753985
Implantable Lead Implant Date: 20190102
Implantable Lead Location: 753860
Implantable Pulse Generator Implant Date: 20190102
Lead Channel Impedance Value: 660 Ohm
Lead Channel Pacing Threshold Amplitude: 0.5 V
Lead Channel Pacing Threshold Pulse Width: 0.5 ms
Lead Channel Sensing Intrinsic Amplitude: 11.5 mV
Lead Channel Setting Pacing Amplitude: 2.5 V
Lead Channel Setting Pacing Pulse Width: 0.5 ms
Lead Channel Setting Sensing Sensitivity: 0.5 mV
Pulse Gen Serial Number: 9786935
Zone Setting Status: 755011

## 2023-04-12 NOTE — Progress Notes (Signed)
EPIC Encounter for ICM Monitoring  Patient Name: Russell Werner is a 67 y.o. male Date: 04/12/2023 Primary Care Physican: Achilles Dunk, DO Primary Cardiologist: In [place] Electrophysiologist: Ladona Ridgel 12/08/2022 Weight: 280 lbs 04/12/2023 Weight: 280 lbs                                                         Spoke with patient and heart failure questions reviewed.  Transmission results reviewed.  Pt asymptomatic for fluid accumulation.  Reports feeling well at this time and voices no complaints.     CorVue thoracic impedance suggesting normal fluid levels with the exception of possible fluid accumulation from 11/15-11/19.   Prescribed: Spironolactone 25 mg take 1 tablet daily     Labs:   12/06/2021 Creatinine 0.92, BUN 18, Potassium 3.8, Sodium 140, GFR >60 10/11/2021 Creatinine 0.86, BUN 12, Potassium 4.7, Sodium 145, GFR 94 A complete set of results can be found in Results Review.   Recommendations:  No changes and encouraged to call if experiencing any fluid symptoms.   Follow-up plan: ICM clinic phone appointment 05/15/2023.   91 day device clinic remote transmission 07/10/2023.     EP/Cardiology Office Visits:  Aware to call the office to make yearly appointment with Dr Ladona Ridgel.  Recall 01/13/2023 with Dr Ladona Ridgel     Copy of ICM check sent to Dr. Ladona Ridgel.   3 month ICM trend: 04/12/2023.    12-14 Month ICM trend:     Karie Soda, RN 04/12/2023 3:36 PM

## 2023-05-17 NOTE — Progress Notes (Signed)
 No ICM remote transmission received for 05/15/2023 and next ICM transmission scheduled for 05/23/2023.

## 2023-05-19 NOTE — Progress Notes (Signed)
Remote ICD transmission.   

## 2023-05-24 ENCOUNTER — Telehealth: Payer: Self-pay

## 2023-05-24 NOTE — Telephone Encounter (Signed)
Attempted call to patient and left voice mail message requesting to send remote transmission.  Advised monitor is showing as disconnected.

## 2023-05-25 NOTE — Progress Notes (Signed)
No ICM remote transmission received for 05/23/2023 and next ICM transmission scheduled for 06/26/2023.

## 2023-06-20 DIAGNOSIS — Z95810 Presence of automatic (implantable) cardiac defibrillator: Secondary | ICD-10-CM | POA: Diagnosis not present

## 2023-06-20 DIAGNOSIS — F419 Anxiety disorder, unspecified: Secondary | ICD-10-CM | POA: Diagnosis not present

## 2023-06-20 DIAGNOSIS — Z1330 Encounter for screening examination for mental health and behavioral disorders, unspecified: Secondary | ICD-10-CM | POA: Diagnosis not present

## 2023-06-20 DIAGNOSIS — H5203 Hypermetropia, bilateral: Secondary | ICD-10-CM | POA: Diagnosis not present

## 2023-06-20 DIAGNOSIS — I429 Cardiomyopathy, unspecified: Secondary | ICD-10-CM | POA: Diagnosis not present

## 2023-06-20 DIAGNOSIS — R079 Chest pain, unspecified: Secondary | ICD-10-CM | POA: Diagnosis not present

## 2023-06-23 DIAGNOSIS — Z0000 Encounter for general adult medical examination without abnormal findings: Secondary | ICD-10-CM | POA: Diagnosis not present

## 2023-06-23 DIAGNOSIS — Z6841 Body Mass Index (BMI) 40.0 and over, adult: Secondary | ICD-10-CM | POA: Diagnosis not present

## 2023-06-23 DIAGNOSIS — I5020 Unspecified systolic (congestive) heart failure: Secondary | ICD-10-CM | POA: Diagnosis not present

## 2023-06-23 DIAGNOSIS — E66813 Obesity, class 3: Secondary | ICD-10-CM | POA: Diagnosis not present

## 2023-06-23 DIAGNOSIS — I10 Essential (primary) hypertension: Secondary | ICD-10-CM | POA: Diagnosis not present

## 2023-06-23 DIAGNOSIS — I110 Hypertensive heart disease with heart failure: Secondary | ICD-10-CM | POA: Diagnosis not present

## 2023-06-23 DIAGNOSIS — E782 Mixed hyperlipidemia: Secondary | ICD-10-CM | POA: Diagnosis not present

## 2023-06-23 DIAGNOSIS — G4733 Obstructive sleep apnea (adult) (pediatric): Secondary | ICD-10-CM | POA: Diagnosis not present

## 2023-06-23 DIAGNOSIS — I2510 Atherosclerotic heart disease of native coronary artery without angina pectoris: Secondary | ICD-10-CM | POA: Diagnosis not present

## 2023-06-23 DIAGNOSIS — F411 Generalized anxiety disorder: Secondary | ICD-10-CM | POA: Diagnosis not present

## 2023-06-23 DIAGNOSIS — J441 Chronic obstructive pulmonary disease with (acute) exacerbation: Secondary | ICD-10-CM | POA: Diagnosis not present

## 2023-06-23 DIAGNOSIS — Z1330 Encounter for screening examination for mental health and behavioral disorders, unspecified: Secondary | ICD-10-CM | POA: Diagnosis not present

## 2023-06-26 ENCOUNTER — Ambulatory Visit: Payer: Medicare HMO | Attending: Internal Medicine

## 2023-06-26 DIAGNOSIS — Z95810 Presence of automatic (implantable) cardiac defibrillator: Secondary | ICD-10-CM

## 2023-06-26 DIAGNOSIS — I5042 Chronic combined systolic (congestive) and diastolic (congestive) heart failure: Secondary | ICD-10-CM | POA: Diagnosis not present

## 2023-06-26 NOTE — Progress Notes (Unsigned)
 EPIC Encounter for ICM Monitoring  Patient Name: Russell Werner is a 68 y.o. male Date: 06/26/2023 Primary Care Physican: Achilles Dunk, DO Primary Cardiologist: In [place] Electrophysiologist: Ladona Ridgel 12/08/2022 Weight: 280 lbs 04/12/2023 Weight: 280 lbs 06/27/2023 Weight: 280 lbs                                                         Spoke with patient and heart failure questions reviewed.  Transmission results reviewed.  Pt asymptomatic for fluid accumulation.  Reports feeling well at this time and voices no complaints.     CorVue thoracic impedance suggesting intermittent days with possible fluid accumulation within the last month.   Prescribed: Spironolactone 25 mg take 1 tablet daily     Labs:   12/06/2021 Creatinine 0.92, BUN 18, Potassium 3.8, Sodium 140, GFR >60 10/11/2021 Creatinine 0.86, BUN 12, Potassium 4.7, Sodium 145, GFR 94 A complete set of results can be found in Results Review.   Recommendations:  No changes and encouraged to call if experiencing any fluid symptoms.   Follow-up plan: ICM clinic phone appointment 07/31/2023.   91 day device clinic remote transmission 07/10/2023.     EP/Cardiology Office Visits:  Is establishing care in [place] and will transfer once he is established.  Recall 01/13/2023 with Dr Ladona Ridgel     Copy of ICM check sent to Dr. Ladona Ridgel.   3 month ICM trend: 06/24/2023.    Karie Soda, RN 06/26/2023 8:51 AM

## 2023-09-18 ENCOUNTER — Telehealth: Payer: Self-pay

## 2023-09-18 NOTE — Telephone Encounter (Signed)
 Pt now follows with Dr. Catana Clinch at [facility] for device management.  Pt has been released in [place], but new clinic has not accepted Pt into their remote clinic.  Successful fax sent to Dr. Merwin Ache device clinic requesting clinic begin to follow Pt's remote transmissions.
